# Patient Record
Sex: Male | Born: 1945 | Race: Black or African American | Hispanic: No | Marital: Married
Health system: Southern US, Academic
[De-identification: ages and names within clinical notes are randomized; demographics above are authoritative.]

## PROBLEM LIST (undated history)

## (undated) ENCOUNTER — Telehealth: Attending: Hematology & Oncology | Primary: Hematology & Oncology

## (undated) ENCOUNTER — Encounter: Attending: Family | Primary: Family

## (undated) ENCOUNTER — Telehealth

## (undated) ENCOUNTER — Ambulatory Visit

## (undated) ENCOUNTER — Encounter

## (undated) ENCOUNTER — Encounter: Attending: Urology | Primary: Urology

## (undated) ENCOUNTER — Encounter: Attending: Hematology & Oncology | Primary: Hematology & Oncology

## (undated) ENCOUNTER — Encounter
Attending: Pharmacist Clinician (PhC)/ Clinical Pharmacy Specialist | Primary: Pharmacist Clinician (PhC)/ Clinical Pharmacy Specialist

## (undated) ENCOUNTER — Telehealth
Attending: Pharmacist Clinician (PhC)/ Clinical Pharmacy Specialist | Primary: Pharmacist Clinician (PhC)/ Clinical Pharmacy Specialist

## (undated) ENCOUNTER — Ambulatory Visit: Payer: MEDICARE

## (undated) ENCOUNTER — Telehealth: Attending: MS" | Primary: MS"

## (undated) ENCOUNTER — Ambulatory Visit: Payer: MEDICARE | Attending: Hematology & Oncology | Primary: Hematology & Oncology

## (undated) ENCOUNTER — Ambulatory Visit
Payer: MEDICARE | Attending: Pharmacist Clinician (PhC)/ Clinical Pharmacy Specialist | Primary: Pharmacist Clinician (PhC)/ Clinical Pharmacy Specialist

## (undated) ENCOUNTER — Ambulatory Visit: Attending: Radiation Oncology | Primary: Radiation Oncology

## (undated) ENCOUNTER — Ambulatory Visit: Payer: MEDICARE | Attending: Internal Medicine | Primary: Internal Medicine

## (undated) ENCOUNTER — Encounter: Attending: Nurse Practitioner | Primary: Nurse Practitioner

## (undated) ENCOUNTER — Encounter: Attending: Oncology | Primary: Oncology

## (undated) ENCOUNTER — Encounter: Attending: Radiation Oncology | Primary: Radiation Oncology

## (undated) ENCOUNTER — Ambulatory Visit: Attending: Hematology & Oncology | Primary: Hematology & Oncology

## (undated) ENCOUNTER — Telehealth: Attending: Nurse Practitioner | Primary: Nurse Practitioner

## (undated) ENCOUNTER — Telehealth: Attending: Registered" | Primary: Registered"

## (undated) ENCOUNTER — Ambulatory Visit
Attending: Student in an Organized Health Care Education/Training Program | Primary: Student in an Organized Health Care Education/Training Program

## (undated) ENCOUNTER — Ambulatory Visit: Payer: MEDICARE | Attending: Urology | Primary: Urology

## (undated) ENCOUNTER — Telehealth: Attending: Internal Medicine | Primary: Internal Medicine

## (undated) DIAGNOSIS — E785 Hyperlipidemia, unspecified: Secondary | ICD-10-CM

## (undated) DIAGNOSIS — K649 Unspecified hemorrhoids: Secondary | ICD-10-CM

## (undated) DIAGNOSIS — R972 Elevated prostate specific antigen [PSA]: Secondary | ICD-10-CM

## (undated) DIAGNOSIS — C801 Malignant (primary) neoplasm, unspecified: Secondary | ICD-10-CM

## (undated) DIAGNOSIS — J449 Chronic obstructive pulmonary disease, unspecified: Secondary | ICD-10-CM

## (undated) DIAGNOSIS — K219 Gastro-esophageal reflux disease without esophagitis: Secondary | ICD-10-CM

## (undated) HISTORY — PX: PROSTATE CRYOABLATION: SUR358

## (undated) HISTORY — PX: HEMORRHOID SURGERY: SHX153

## (undated) MED ORDER — CALCIUM CARBONATE 500 MG CALCIUM (1,250 MG) TABLET: ORAL | 0 days

## (undated) MED ORDER — PUMPKIN SEED EXTRACT-SOY GERM 300 MG CAPSULE: ORAL | 0 days

## (undated) MED ORDER — MULTIVITAMIN CAPSULE: ORAL | 0 days

## (undated) MED ORDER — NON FORMULARY: ORAL | 0 days | PRN

## (undated) MED ORDER — SAW PALMETTO 160 MG CAPSULE: ORAL | 0 days

## (undated) MED ORDER — GARLIC 1,000 MG CAPSULE: ORAL | 0 days

## (undated) MED ORDER — CHOLECALCIFEROL (VITAMIN D3) 25 MCG (1,000 UNIT) TABLET: ORAL | 0 days

## (undated) MED ORDER — UNABLE TO FIND: Freq: Every day | ORAL | 0.00000 days | Status: SS

---
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
## 2006-01-24 ENCOUNTER — Ambulatory Visit: Payer: Self-pay | Admitting: Urology

## 2006-02-02 ENCOUNTER — Ambulatory Visit: Payer: Self-pay | Admitting: Urology

## 2006-03-06 ENCOUNTER — Ambulatory Visit: Payer: Self-pay | Admitting: Oncology

## 2006-03-23 ENCOUNTER — Ambulatory Visit: Payer: Self-pay | Admitting: Urology

## 2007-07-08 IMAGING — NM NUCLEAR MEDICINE WHOLE BODY BONE SCINTIGRAPHY
2 series · 6 of 6 positions shown · non-contrast
Comparison: none

REASON FOR EXAM: PROSTATE CA
COMMENTS:

[Series 1: 3 hr wholebody · 2.40mm/px · 2 of 2 frames shown]
[frame 1/2]
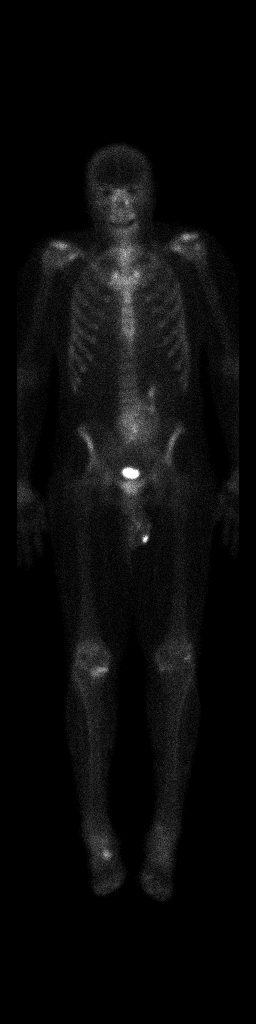
[frame 2/2]
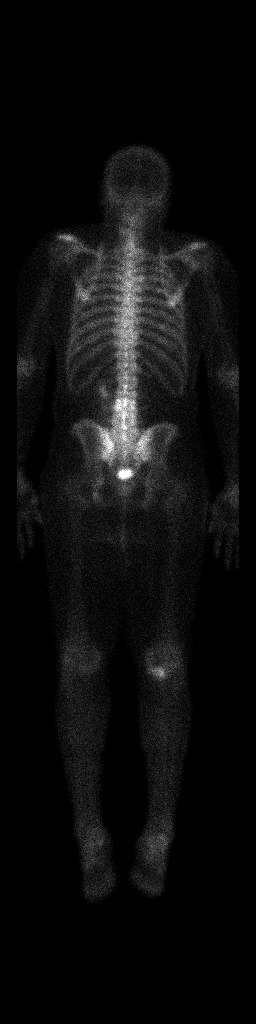

[Series 1: bone statics · 2.40mm/px · 2 acquisitions, 4 frames shown]
[im 1/2]
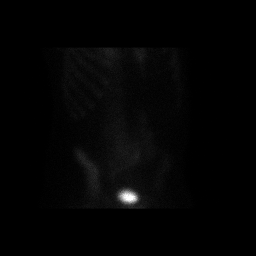
[im 1/2]
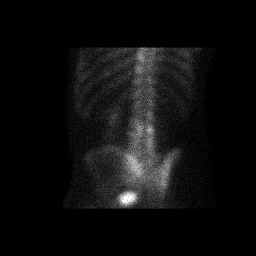
[im 2/2]
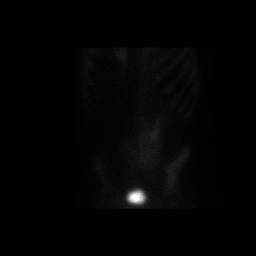
[im 2/2]
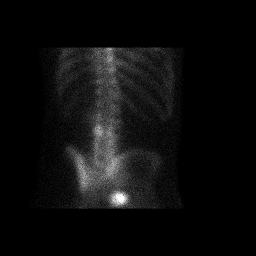

[6 of 6 positions shown; findings below may reference images not displayed]

PROCEDURE:     NM  - NM BONE WB 3 HR [DATE]  [DATE]

RESULT:     The patient has a history of prostate malignancy.  The patient
received 22.14 millicuries of technetium-33m labeled MDP for this study.

There is adequate uptake of the radiopharmaceutical by the skeleton.  No
RIGHT kidney is identified.  There is increased uptake in the lower abdomen
which may reflect a malpositioned RIGHT kidney.  This will merit further
evaluation with abdominal and pelvic CT scanning.  I do not see significant
abnormal uptake in the calvarium, spine, rib cage, pectoral girdle, or
pelvic girdle.  Mildly increased uptake is noted involving the RIGHT knee
and the RIGHT ankle consistent with degenerative change.  Minimal
degenerative-type change involving the LEFT knee is seen.
IMPRESSION: I do not see evidence of bony metastatic disease.

There is abnormal uptake of the radiopharmaceutical by the kidneys and there
may be at least a pelvic kidney present.  No normal uptake on the RIGHT is
seen.  CT scanning of the abdomen and pelvis is recommended.

Please note that there is some increased uptake over the lumbar spine that
is felt to lie within the presumed renal tissue in this area.  It is not
clearly within the lumbar spine itself.

## 2007-10-19 ENCOUNTER — Ambulatory Visit: Payer: Self-pay | Admitting: Unknown Physician Specialty

## 2009-12-02 ENCOUNTER — Ambulatory Visit: Payer: Self-pay | Admitting: Urology

## 2010-01-06 ENCOUNTER — Encounter (HOSPITAL_COMMUNITY): Admission: RE | Admit: 2010-01-06 | Discharge: 2010-04-06 | Payer: Self-pay | Admitting: Urology

## 2010-02-25 ENCOUNTER — Ambulatory Visit: Payer: Self-pay | Admitting: Urology

## 2013-06-21 ENCOUNTER — Ambulatory Visit: Payer: Self-pay | Admitting: Unknown Physician Specialty

## 2013-06-24 LAB — PATHOLOGY REPORT

## 2015-02-08 ENCOUNTER — Ambulatory Visit
Admission: EM | Admit: 2015-02-08 | Discharge: 2015-02-08 | Disposition: A | Discharge status: Home / Self Care | Payer: Medicare Other | Attending: Family Medicine | Admitting: Family Medicine

## 2015-02-08 ENCOUNTER — Encounter: Payer: Self-pay | Admitting: *Deleted

## 2015-02-08 DIAGNOSIS — J449 Chronic obstructive pulmonary disease, unspecified: Secondary | ICD-10-CM

## 2015-02-08 DIAGNOSIS — IMO0001 Reserved for inherently not codable concepts without codable children: Secondary | ICD-10-CM

## 2015-02-08 HISTORY — DX: Chronic obstructive pulmonary disease, unspecified: J44.9

## 2015-02-08 MED ORDER — AZITHROMYCIN 250 MG PO TABS: ORAL_TABLET | ORAL | Status: DC

## 2015-02-08 MED ORDER — GUAIFENESIN-CODEINE 100-10 MG/5ML PO SOLN: ORAL | Status: DC

## 2015-02-08 NOTE — ED Provider Notes (Signed)
CSN: [id number]     Arrival date & time 02/08/15  1205 History   First MD Initiated Contact with Patient 02/08/15 1357     Chief Complaint  Patient presents with  . Facial Pain   (Consider location/radiation/quality/duration/timing/severity/associated sxs/prior Treatment) Patient is a 69 y.o. male presenting with cough. The history is provided by the patient.  Cough Cough characteristics:  Productive Severity:  Moderate Onset quality:  Gradual Duration:  1 week Timing:  Constant Progression:  Worsening Chronicity:  New Smoker: yes (1 cigar a day; used to smoke cigarretes)   Context: exposure to allergens   Relieved by:  Nothing Worsened by:  Nothing tried Ineffective treatments:  None tried Associated symptoms: chills, headaches, rhinorrhea and sinus congestion   Associated symptoms: no chest pain, no fever, no myalgias, no rash, no shortness of breath, no sore throat, no weight loss and no wheezing     Past Medical History  Diagnosis Date  . COPD (chronic obstructive pulmonary disease)    History reviewed. No pertinent past surgical history. Family History  Problem Relation Age of Onset  . Diabetes Mother   . Diabetes Father   . Cancer Other    History  Substance Use Topics  . Smoking status: Current Every Day Smoker -- 1.00 packs/day for 25 years    Types: Cigarettes  . Smokeless tobacco: Not on file  . Alcohol Use: Yes    Review of Systems  Constitutional: Positive for chills. Negative for fever and weight loss.  HENT: Positive for rhinorrhea. Negative for sore throat.   Respiratory: Positive for cough. Negative for shortness of breath and wheezing.   Cardiovascular: Negative for chest pain.  Musculoskeletal: Negative for myalgias.  Skin: Negative for rash.  Neurological: Positive for headaches.    Allergies  Review of patient's allergies indicates no known allergies.  Home Medications   Prior to Admission medications   Medication Sig Start Date End  Date Taking? Authorizing Provider  albuterol (PROVENTIL HFA;VENTOLIN HFA) 108 (90 BASE) MCG/ACT inhaler Inhale into the lungs every 6 (six) hours as needed for wheezing or shortness of breath.   Yes Historical Provider, MD  bicalutamide (CASODEX) 50 MG tablet Take 50 mg by mouth daily.   Yes Historical Provider, MD  azithromycin (ZITHROMAX Z-PAK) 250 MG tablet 2 tabs po once day 1, then 1 tab po qd for 4 days 02/08/15   Norval Gable, MD  guaiFENesin-codeine 100-10 MG/5ML syrup 5-34mls po qhs prn cough 02/08/15   Norval Gable, MD   BP 124/73 mmHg  Pulse 90  Temp(Src) 98.4 F (36.9 C) (Oral)  Ht 5\' 9"  (1.753 m)  Wt 220 lb (99.791 kg)  BMI 32.47 kg/m2  SpO2 97% Physical Exam  Constitutional: He appears well-developed and well-nourished. No distress.  HENT:  Head: Normocephalic and atraumatic.  Right Ear: Tympanic membrane, external ear and ear canal normal.  Left Ear: Tympanic membrane, external ear and ear canal normal.  Nose: Nose normal.  Mouth/Throat: Uvula is midline, oropharynx is clear and moist and mucous membranes are normal. No oropharyngeal exudate or tonsillar abscesses.  Eyes: Conjunctivae and EOM are normal. Pupils are equal, round, and reactive to light. Right eye exhibits no discharge. Left eye exhibits no discharge. No scleral icterus.  Neck: Normal range of motion. Neck supple. No tracheal deviation present. No thyromegaly present.  Cardiovascular: Normal rate, regular rhythm and normal heart sounds.   Pulmonary/Chest: Effort normal. No stridor. No respiratory distress. He has wheezes. He has no rales. He exhibits no  tenderness.  Few diffuse wheezes and diffuse rhonchi  Lymphadenopathy:    He has no cervical adenopathy.  Neurological: He is alert.  Skin: Skin is warm and dry. No rash noted. He is not diaphoretic.  Nursing note and vitals reviewed.   ED Course  Procedures (including critical care time) Labs Review Labs Reviewed - No data to display  Imaging  Review No results found.   MDM   1. COPD with bronchitis    Discharge Medication List as of 02/08/2015  2:29 PM    START taking these medications   Details  azithromycin (ZITHROMAX Z-PAK) 250 MG tablet 2 tabs po once day 1, then 1 tab po qd for 4 days, Normal    guaiFENesin-codeine 100-10 MG/5ML syrup 5-15mls po qhs prn cough, Print      Plan: 1. Diagnosis reviewed with patient 2. rx as per orders; risks, benefits, potential side effects reviewed with patient; continue ventolin inhaler 3. Recommend supportive treatment with increased fluids, smoking cessationi 4. F/u prn if symptoms worsen or don't improve    Norval Gable, MD 02/08/15 1432

## 2015-02-08 NOTE — ED Notes (Signed)
Started 4 days ago, sore throat, coughing green sputum, mild headache and sinus pressure and congestion.

## 2015-12-22 ENCOUNTER — Encounter: Payer: Self-pay | Admitting: *Deleted

## 2015-12-22 ENCOUNTER — Ambulatory Visit: Payer: Medicare Other

## 2015-12-22 ENCOUNTER — Ambulatory Visit
Admission: EM | Admit: 2015-12-22 | Discharge: 2015-12-22 | Disposition: A | Discharge status: Home / Self Care | Payer: Medicare Other | Attending: Family Medicine | Admitting: Family Medicine

## 2015-12-22 DIAGNOSIS — R05 Cough: Secondary | ICD-10-CM | POA: Diagnosis present

## 2015-12-22 DIAGNOSIS — F17210 Nicotine dependence, cigarettes, uncomplicated: Secondary | ICD-10-CM | POA: Insufficient documentation

## 2015-12-22 DIAGNOSIS — Z79899 Other long term (current) drug therapy: Secondary | ICD-10-CM | POA: Insufficient documentation

## 2015-12-22 DIAGNOSIS — R1013 Epigastric pain: Secondary | ICD-10-CM | POA: Diagnosis not present

## 2015-12-22 DIAGNOSIS — R972 Elevated prostate specific antigen [PSA]: Secondary | ICD-10-CM | POA: Diagnosis not present

## 2015-12-22 DIAGNOSIS — J209 Acute bronchitis, unspecified: Secondary | ICD-10-CM | POA: Diagnosis not present

## 2015-12-22 DIAGNOSIS — Z8546 Personal history of malignant neoplasm of prostate: Secondary | ICD-10-CM | POA: Insufficient documentation

## 2015-12-22 DIAGNOSIS — Z9889 Other specified postprocedural states: Secondary | ICD-10-CM | POA: Insufficient documentation

## 2015-12-22 DIAGNOSIS — J440 Chronic obstructive pulmonary disease with acute lower respiratory infection: Secondary | ICD-10-CM | POA: Insufficient documentation

## 2015-12-22 DIAGNOSIS — K30 Functional dyspepsia: Secondary | ICD-10-CM | POA: Insufficient documentation

## 2015-12-22 DIAGNOSIS — K219 Gastro-esophageal reflux disease without esophagitis: Secondary | ICD-10-CM | POA: Insufficient documentation

## 2015-12-22 DIAGNOSIS — R062 Wheezing: Secondary | ICD-10-CM | POA: Insufficient documentation

## 2015-12-22 HISTORY — DX: Elevated prostate specific antigen (PSA): R97.20

## 2015-12-22 MED ORDER — AZITHROMYCIN 250 MG PO TABS: ORAL_TABLET | ORAL | Status: DC

## 2015-12-22 MED ORDER — BENZONATATE 100 MG PO CAPS: 100.0000 mg | ORAL_CAPSULE | Freq: Three times a day (TID) | ORAL | Status: DC | PRN

## 2015-12-22 MED ORDER — IPRATROPIUM-ALBUTEROL 0.5-2.5 (3) MG/3ML IN SOLN
3.0000 mL | Freq: Four times a day (QID) | RESPIRATORY_TRACT | Status: DC
  Administered 2015-12-22: 3 mL via RESPIRATORY_TRACT

## 2015-12-22 MED ORDER — PREDNISONE 20 MG PO TABS: 40.0000 mg | ORAL_TABLET | Freq: Every day | ORAL | Status: DC

## 2015-12-22 NOTE — ED Notes (Signed)
Patient started having gas pains for 2 weeks in the epi gastric region and abdomin. Additional symptom of cough is reported. No other symptoms reported.

## 2015-12-22 NOTE — Discharge Instructions (Signed)
Take medication as prescribed. Rest. Drink and eat regularly. Avoid aggravating foods or drinks. Take your home prilosec as directed, regularly.   Follow up with your primary care physician this week as discussed. Follow up with Dr Jacqlyn Larsen in one week. You need to follow up regarding our discussion pelvis xray findings.     Return to Urgent care or ER for chest pain, shortness of breath, abdominal pain, urinary or bowel changes, weight loss, new or worsening concerns.   Chronic Obstructive Pulmonary Disease Chronic obstructive pulmonary disease (COPD) is a common lung problem. In COPD, the flow of air from the lungs is limited. The way your lungs work will probably never return to normal, but there are things you can do to improve your lungs and make yourself feel better. Your doctor may treat your condition with:  Medicines.  Oxygen.  Lung surgery.  Changes to your diet.  Rehabilitation. This may involve a team of specialists. HOME CARE  Take all medicines as told by your doctor.  Avoid medicines or cough syrups that dry up your airway (such as antihistamines) and do not allow you to get rid of thick spit. You do not need to avoid them if told differently by your doctor.  If you smoke, stop. Smoking makes the problem worse.  Avoid being around things that make your breathing worse (like smoke, chemicals, and fumes).  Use oxygen therapy and therapy to help improve your lungs (pulmonary rehabilitation) if told by your doctor. If you need home oxygen therapy, ask your doctor if you should buy a tool to measure your oxygen level (oximeter).  Avoid people who have a sickness you can catch (contagious).  Avoid going outside when it is very hot, cold, or humid.  Eat healthy foods. Eat smaller meals more often. Rest before meals.  Stay active, but remember to also rest.  Make sure to get all the shots (vaccines) your doctor recommends. Ask your doctor if you need a pneumonia  shot.  Learn and use tips on how to relax.  Learn and use tips on how to control your breathing as told by your doctor. Try:  Breathing in (inhaling) through your nose for 1 second. Then, pucker your lips and breath out (exhale) through your lips for 2 seconds.  Putting one hand on your belly (abdomen). Breathe in slowly through your nose for 1 second. Your hand on your belly should move out. Pucker your lips and breathe out slowly through your lips. Your hand on your belly should move in as you breathe out.  Learn and use controlled coughing to clear thick spit from your lungs. The steps are:  Lean your head a little forward.  Breathe in deeply.  Try to hold your breath for 3 seconds.  Keep your mouth slightly open while coughing 2 times.  Spit any thick spit out into a tissue.  Rest and do the steps again 1 or 2 times as needed. GET HELP IF:  You cough up more thick spit than usual.  There is a change in the color or thickness of the spit.  It is harder to breathe than usual.  Your breathing is faster than usual. GET HELP RIGHT AWAY IF:  You have shortness of breath while resting.  You have shortness of breath that stops you from:  Being able to talk.  Doing normal activities.  You chest hurts for longer than 5 minutes.  Your skin color is more blue than usual.  Your pulse oximeter  shows that you have low oxygen for longer than 5 minutes. MAKE SURE YOU:  Understand these instructions.  Will watch your condition.  Will get help right away if you are not doing well or get worse.   This information is not intended to replace advice given to you by your health care provider. Make sure you discuss any questions you have with your health care provider.   Document Released: 02/15/2008 Document Revised: 09/19/2014 Document Reviewed: 04/25/2013 Elsevier Interactive Patient Education 2016 Else[employer]  Acute Bronchitis Bronchitis is when the airways that extend  from the windpipe into the lungs get red, puffy, and painful (inflamed). Bronchitis often causes thick spit (mucus) to develop. This leads to a cough. A cough is the most common symptom of bronchitis. In acute bronchitis, the condition usually begins suddenly and goes away over time (usually in 2 weeks). Smoking, allergies, and asthma can make bronchitis worse. Repeated episodes of bronchitis may cause more lung problems. HOME CARE  Rest.  Drink enough fluids to keep your pee (urine) clear or pale yellow (unless you need to limit fluids as told by your doctor).  Only take over-the-counter or prescription medicines as told by your doctor.  Avoid smoking and secondhand smoke. These can make bronchitis worse. If you are a smoker, think about using nicotine gum or skin patches. Quitting smoking will help your lungs heal faster.  Reduce the chance of getting bronchitis again by:  Washing your hands often.  Avoiding people with cold symptoms.  Trying not to touch your hands to your mouth, nose, or eyes.  Follow up with your doctor as told. GET HELP IF: Your symptoms do not improve after 1 week of treatment. Symptoms include:  Cough.  Fever.  Coughing up thick spit.  Body aches.  Chest congestion.  Chills.  Shortness of breath.  Sore throat. GET HELP RIGHT AWAY IF:   You have an increased fever.  You have chills.  You have severe shortness of breath.  You have bloody thick spit (sputum).  You throw up (vomit) often.  You lose too much body fluid (dehydration).  You have a severe headache.  You faint. MAKE SURE YOU:   Understand these instructions.  Will watch your condition.  Will get help right away if you are not doing well or get worse.   This information is not intended to replace advice given to you by your health care provider. Make sure you discuss any questions you have with your health care provider.   Document Released: 02/15/2008 Document Revised:  05/01/2013 Document Reviewed: 02/19/2013 Elsevier Interactive Patient Education [employer].

## 2015-12-22 NOTE — ED Provider Notes (Signed)
[facility]  ____________________________________________  Time seen: Approximately 3:20 PM  I have reviewed the triage vital signs and the nursing notes.   HISTORY  Chief Complaint Cough and wheezing   HPI Angel Moses is a 70 y.o. male presents with complaints of 1-2 weeks of intermittent runny nose and nasal congestion, chest congestion and intermittent wheezing. Patient reports his biggest reason for coming in today was the wheezing. Patient reports he does have a dry hacking cough. States occasionally he will cough up whitish phlegm. Patient states that he can hear himself wheeze intermittently. Patient reports that he can feel the mucus drainage in the back of his throat. Denies issues with seasonal allergies. Denies any persistent wheezing. Patient reports that he does have home albuterol inhaler which he used this morning,  which helped with wheezing quite a bit per patient. Denies any pain at this time.  Patient has a history of intermittent wheezing when sick per patient. Denies any chest pain, shortness of breath, chest pain with deep breath, extremity pain or extremity swelling. Denies weakness, dizziness, neck pain, back pain, dysuria, fevers. Denies abnormal bleeding. Denies blood in phlegm or mucus, denies blood in stool. Denies known sick contacts. Denies urinary or bowel changes. Denies constipation.  Patient does also report that over the last 3-4 weeks he has intermittent increased burping and flatulence. Patient states accompanying this he will have an occasional what he describes as a twinge in his upper abdomen that fully resolved after burping or passing gas. Patient states that this is not a pain but described again as a mild twinge. Patient states that the twinge is only present for a few seconds at a time and does not necessarily occur every day. States occasionally has increased with different foods that he eats but denies knowing exactly what foods cause this.  States unesolved with over-the-counter Pepto-Bismol. Patient reports that he does take Prilosec at home which helps, but states that he does not take it every day and hasn't been taking it regularly. Denies any other taken medications for the same.  PCP: Ellison Hughs Urologist: Jacqlyn Larsen  Past Medical History  Diagnosis Date  . COPD (chronic obstructive pulmonary disease) ([place])   . Elevated PSA   Prostate Cancer GERD Occasional urinary incontinence: Per patient chronic and unchanged.  There are no active problems to display for this patient.   Past Surgical History  Procedure Laterality Date  . Prostate cryoablation      Current Outpatient Rx  Name  Route  Sig  Dispense  Refill  . albuterol (PROVENTIL HFA;VENTOLIN HFA) 108 (90 BASE) MCG/ACT inhaler   Inhalation   Inhale into the lungs every 6 (six) hours as needed for wheezing or shortness of breath.         .           .           . bicalutamide (CASODEX) 50 MG tablet   Oral   Take 50 mg by mouth daily.                      Allergies Review of patient's allergies indicates no known allergies.  Family History  Problem Relation Age of Onset  . Diabetes Mother   . Diabetes Father   . Cancer Other     Social History Social History  Substance Use Topics  . Smoking status: Current Every Day Smoker -- 1.00 packs/day for 25 years    Types: Cigarettes  .  Smokeless tobacco: Never Used  . Alcohol Use: Yes    Review of Systems Constitutional: No fever/chills Eyes: No visual changes. ENT: No sore throat. Cardiovascular: Denies chest pain. Respiratory: Denies shortness of breath. Gastrointestinal: No abdominal pain.  No nausea, no vomiting.  No diarrhea.  No constipation. Genitourinary: Negative for dysuria. Musculoskeletal: Negative for back pain. Skin: Negative for rash. Neurological: Negative for headaches, focal weakness or numbness.  10-point ROS otherwise  negative.  ____________________________________________   PHYSICAL EXAM:  VITAL SIGNS: ED Triage Vitals  Enc Vitals Group     BP 12/22/15 1315 136/56 mmHg     Pulse Rate 12/22/15 1315 86     Resp 12/22/15 1315 18     Temp 12/22/15 1315 98.2 F (36.8 C)     Temp Source 12/22/15 1315 Tympanic     SpO2 12/22/15 1315 98 %     Weight 12/22/15 1315 220 lb (99.791 kg)     Height 12/22/15 1315 5\' 9"  (1.753 m)     Head Cir --      Peak Flow --      Pain Score 12/22/15 1320 0     Pain Loc --      Pain Edu? --      Excl. in [place]? --    Constitutional: Alert and oriented. Well appearing and in no acute distress. Eyes: Conjunctivae are normal. PERRL. EOMI. Head: Atraumatic.Mild tenderness to palpation bilateral frontal and maxillary sinuses. No swelling. No erythema.   Ears: no erythema, normal TMs bilaterally.   Nose:mild nasal congestion with bilateral nasal turbinate erythema and edema.   Mouth/Throat: Mucous membranes are moist.  Oropharynx non-erythematous.No tonsillar swelling or exudate.  Neck: No stridor.  No cervical spine tenderness to palpation. Hematological/Lymphatic/Immunilogical: No cervical lymphadenopathy. Cardiovascular: Normal rate, regular rhythm. Grossly normal heart sounds.  Good peripheral circulation. Respiratory: Normal respiratory effort.  No retractions. Mild scattered rhonchi. Mild to moderate Inspiratory and expiratory wheezes present. Dry intermittent cough noted in room.Kermit Balo air movement. Speaks in complete sentences. Ambulatory in room while speaking in complete sentences. Gastrointestinal: Soft and nontender. No distention. Normal Bowel sounds. No CVA tenderness. Sits up from lying to sitting position quickly with use of abdominal muscles without pain. Musculoskeletal: No lower or upper extremity tenderness nor edema.  Bilateral pedal pulses equal and easily palpated. No cervical, thoracic or lumbar tenderness to palpation. No hip or pelvis or lumbar  tenderness. Full range of motion bilateral hips without pain. Neurologic:  Normal speech and language. No gross focal neurologic deficits are appreciated. No gait instability. Skin:  Skin is warm, dry and intact. No rash noted. Psychiatric: Mood and affect are normal. Speech and behavior are normal.  ____________________________________________   LABS (all labs ordered are listed, but only abnormal results are displayed)  Labs Reviewed - No data to display   ECG ED ECG REPORT I, Marylene Land, the attending provider and Dr Zenda Alpers, personally viewed and interpreted this ECG.   Date: 12/22/2015  EKG Time: 1316  Rate: 86  Rhythm: normal EKG, normal sinus rhythm, unchanged from previous tracings, normal sinus rhythm  Axis: normal  Intervals:none  ST&T Change: no ST elevation No EKG for comparison found.    Radiology  EXAM: ABDOMEN - 1 VIEW  COMPARISON: Chest x-ray same date dictated separately.  FINDINGS: No plain film evidence of bowel obstruction.  The possibility of free intraperitoneal air cannot be assessed on a supine view.  Scattered small sclerotic foci involving portions of the pelvis. Small metastatic lesions  not excluded although findings may be partially explained by degenerative changes (sacroiliac joints, pubic symphysis and hip joints).  Mild degenerative changes lumbar spine with minimal curvature convex to the right.  IMPRESSION: No plain film evidence bowel obstruction.  Question possibility of sclerotic metastatic foci involving portions of the pelvis as noted above.   Electronically Signed By: Genia Del M.D. On: 12/22/2015 14:43          DG Chest 2 View (Final result) Result time: 12/22/15 14:41:29   Final result by Rad Results In Interface (12/22/15 14:41:29)   Narrative:   CLINICAL DATA: Epigastric discomfort for the past 2 weeks, productive cough  EXAM: CHEST 2 VIEW  COMPARISON: Report of a chest x-ray dated  March 11, 2014  FINDINGS: The lungs remain hyperinflated with hemidiaphragm flattening. There is no focal infiltrate. There is no pleural effusion. The heart and pulmonary vascularity are normal. The mediastinum is normal in width. There is no pleural effusion. There is mild multilevel degenerative disc disease of the thoracic spine. The gas pattern in the upper abdomen is normal where visualized.  IMPRESSION: COPD. There is no evidence of pneumonia nor other active cardiopulmonary disease.   Electronically Signed By: David Martinique M.D. On: 12/22/2015 14:41       INITIAL IMPRESSION / ASSESSMENT AND PLAN / ED COURSE  Pertinent labs & imaging results that were available during my care of the patient were reviewed by me and considered in my medical decision making (see chart for details).   Very well-appearing patient in no acute distress. Presenting for the complaints of 2 weeks of cough, congestion with intermittent wheezing. Denies chest pain, shortness breath, chest pain with deep breath, abdominal pain. Does also report intermittent indigestion discomfort which he describes as having more gas than normal. Reports there are some dietary triggers that affect this but denies remembering exactly what foods all this. States home Prilosec helps. Denies any abdominal pain, urinary or bowel changes. Reports frequent burping and flatulence. Patient with mild scattered rhonchi and mild scattered inspiratory and expiratory wheezes. Abdomen soft and nontender, with normal bowel sounds. Suspect COPD exacerbation with bronchitis. Suspect indigestion, due to patient reported triggers and improvements. Patient stable appearance. Will evaluate chest xray and KUB. Discussed with patient, evaluation with labs, patient denies need for labs, as symptoms appear stable over several weeks with likely indigestion, will defer labs at this time. Albuterol ipratropium neb 1.  KUB no plain film evidence of  bowel obstruction, question possibility of sclerotic metastastic foci involving portions of the pelvis as noted above per radiologist. Patient reports that he doesn't have a known history of arthritis in hips and pelvis. Chest x-ray per radiologist COPD, there is no evidence of pneumonia nor other active cardiopulmonary disease.   discuss x-ray results in detail with patient. Lungs reexamined, wheezes fully resolved. Patient reports cough and chest congestion much improved. Patient states feeling better. Suspect COPD exacerbation of bronchitis. Will treat with azithromycin, when necessary Tessalon Perles, prednisone 5 days and use of home albuterol inhaler. Suspect indigestion regarding patient's other symptoms as he described. Abdomen soft and nontender. Discussed KUB incidental x-ray findings and discuss these with patient, recommend patient to follow-up closely with his primary care physician regarding further evaluation and management of this. Patient reports last PET scan was in 2011. Discussed with patient to follow-up in regards to potential repeat PET versus comparison to previous findings. Encouraged patient to have close follow-up with his primary care physician. Encourage follow-up with his primary care  physician this week. Also recommend follow-up with his urologist Dr. Jacqlyn Larsen who per patient manages his cancer.  Discussed follow up with Primary care physician this week. Discussed follow up and return parameters to urgent care or ER  including  chest pain, shortness of breath, abdominal pain, vomiting, nausea, dizziness, weakness, no resolution or any worsening concerns. Patient verbalized understanding and agreed to plan.   ____________________________________________   FINAL CLINICAL IMPRESSION(S) / ED DIAGNOSES  Final diagnoses:  COPD with acute bronchitis ([place])  Indigestion      Note: This dictation was prepared with Dragon dictation along with smaller phrase technology. Any  transcriptional errors that result from this process are unintentional.    Marylene Land, NP 12/22/15 1802

## 2016-01-10 ENCOUNTER — Ambulatory Visit
Admission: EM | Admit: 2016-01-10 | Discharge: 2016-01-10 | Disposition: A | Discharge status: Home / Self Care | Payer: Medicare Other | Attending: Family Medicine | Admitting: Family Medicine

## 2016-01-10 DIAGNOSIS — IMO0001 Reserved for inherently not codable concepts without codable children: Secondary | ICD-10-CM

## 2016-01-10 DIAGNOSIS — J449 Chronic obstructive pulmonary disease, unspecified: Secondary | ICD-10-CM | POA: Diagnosis not present

## 2016-01-10 DIAGNOSIS — J0100 Acute maxillary sinusitis, unspecified: Secondary | ICD-10-CM | POA: Diagnosis not present

## 2016-01-10 DIAGNOSIS — H9202 Otalgia, left ear: Secondary | ICD-10-CM

## 2016-01-10 MED ORDER — PREDNISONE 10 MG PO TABS: ORAL_TABLET | ORAL | Status: DC

## 2016-01-10 MED ORDER — DOXYCYCLINE HYCLATE 100 MG PO CAPS: 100.0000 mg | ORAL_CAPSULE | Freq: Two times a day (BID) | ORAL | Status: DC

## 2016-01-10 MED ORDER — FLUTICASONE PROPIONATE 50 MCG/ACT NA SUSP: 2.0000 | Freq: Every day | NASAL | Status: DC

## 2016-01-10 NOTE — ED Notes (Signed)
Patient states that in his left ear he has a sensation that "something is moving in there". Patient states that he was supposed to follow up for his cough on 01/15/2016, so he wanted to see if he could be seen for that as well. Patient states that he is still having a cough with mild mucus production. Patient states that cough is intermittent.

## 2016-01-10 NOTE — Discharge Instructions (Signed)
Take medication as prescribed. Rest. Use home albuterol inhaler as needed for wheezing.  Follow up with your primary care physician this week as scheduled.   Return to Urgent care or ER for new or worsening concerns.    Chronic Obstructive Pulmonary Disease Exacerbation Chronic obstructive pulmonary disease (COPD) is a common lung condition in which airflow from the lungs is limited. COPD is a general term that can be used to describe many different lung problems that limit airflow, including chronic bronchitis and emphysema. COPD exacerbations are episodes when breathing symptoms become much worse and require extra treatment. Without treatment, COPD exacerbations can be life threatening, and frequent COPD exacerbations can cause further damage to your lungs. CAUSES  Respiratory infections.  Exposure to smoke.  Exposure to air pollution, chemical fumes, or dust. Sometimes there is no apparent cause or trigger. RISK FACTORS  Smoking cigarettes.  Older age.  Frequent prior COPD exacerbations. SIGNS AND SYMPTOMS  Increased coughing.  Increased thick spit (sputum) production.  Increased wheezing.  Increased shortness of breath.  Rapid breathing.  Chest tightness. DIAGNOSIS Your medical history, a physical exam, and tests will help your health care provider make a diagnosis. Tests may include:  A chest X-ray.  Basic lab tests.  Sputum testing.  An arterial blood gas test. TREATMENT Depending on the severity of your COPD exacerbation, you may need to be admitted to a hospital for treatment. Some of the treatments commonly used to treat COPD exacerbations are:   Antibiotic medicines.  Bronchodilators. These are drugs that expand the air passages. They may be given with an inhaler or nebulizer. Spacer devices may be needed to help improve drug delivery.  Corticosteroid medicines.  Supplemental oxygen therapy.  Airway clearing techniques, such as noninvasive  ventilation (NIV) and positive expiratory pressure (PEP). These provide respiratory support through a mask or other noninvasive device. HOME CARE INSTRUCTIONS  Do not smoke. Quitting smoking is very important to prevent COPD from getting worse and exacerbations from happening as often.  Avoid exposure to all substances that irritate the airway, especially to tobacco smoke.  If you were prescribed an antibiotic medicine, finish it all even if you start to feel better.  Take all medicines as directed by your health care provider.It is important to use correct technique with inhaled medicines.  Drink enough fluids to keep your urine clear or pale yellow (unless you have a medical condition that requires fluid restriction).  Use a cool mist vaporizer. This makes it easier to clear your chest when you cough.  If you have a home nebulizer and oxygen, continue to use them as directed.  Maintain all necessary vaccinations to prevent infections.  Exercise regularly.  Eat a healthy diet.  Keep all follow-up appointments as directed by your health care provider. SEEK IMMEDIATE MEDICAL CARE IF:  You have worsening shortness of breath.  You have trouble talking.  You have severe chest pain.  You have blood in your sputum.  You have a fever.  You have weakness, vomit repeatedly, or faint.  You feel confused.  You continue to get worse. MAKE SURE YOU:  Understand these instructions.  Will watch your condition.  Will get help right away if you are not doing well or get worse.   This information is not intended to replace advice given to you by your health care provider. Make sure you discuss any questions you have with your health care provider.   Document Released: 06/26/2007 Document Revised: 09/19/2014 Document Reviewed: 05/03/2013 Elsevier  Interactive Patient Education 2016 [place] An earache, also called otalgia, can be caused by many things. Pain from an  earache can be sharp, dull, or burning. The pain may be temporary or constant. Earaches can be caused by problems with the ear, such as infection in either the middle ear or the ear canal, injury, impacted ear wax, middle ear pressure, or a foreign body in the ear. Ear pain can also result from problems in other areas. This is called referred pain. For example, pain can come from a sore throat, a tooth infection, or problems with the jaw or the joint between the jaw and the skull (temporomandibular joint, or TMJ). The cause of an earache is not always easy to identify. Watchful waiting may be appropriate for some earaches until a clear cause of the pain can be found. HOME CARE INSTRUCTIONS Watch your condition for any changes. The following actions may help to lessen any discomfort that you are feeling:  Take medicines only as directed by your health care provider. This includes ear drops.  Apply ice to your outer ear to help reduce pain.  Put ice in a plastic bag.  Place a towel between your skin and the bag.  Leave the ice on for 20 minutes, 2-3 times per day.  Do not put anything in your ear other than medicine that is prescribed by your health care provider.  Try resting in an upright position instead of lying down. This may help to reduce pressure in the middle ear and relieve pain.  Chew gum if it helps to relieve your ear pain.  Control any allergies that you have.  Keep all follow-up visits as directed by your health care provider. This is important. SEEK MEDICAL CARE IF:  Your pain does not improve within 2 days.  You have a fever.  You have new or worsening symptoms. SEEK IMMEDIATE MEDICAL CARE IF:  You have a severe headache.  You have a stiff neck.  You have difficulty swallowing.  You have redness or swelling behind your ear.  You have drainage from your ear.  You have hearing loss.  You feel dizzy.   This information is not intended to replace advice  given to you by your health care provider. Make sure you discuss any questions you have with your health care provider.   Document Released: 04/15/2004 Document Revised: 09/19/2014 Document Reviewed: 03/30/2014 Elsevier Interactive Patient Education 2016 [employer]  Sinusitis, Adult Sinusitis is redness, soreness, and inflammation of the paranasal sinuses. Paranasal sinuses are air pockets within the bones of your face. They are located beneath your eyes, in the middle of your forehead, and above your eyes. In healthy paranasal sinuses, mucus is able to drain out, and air is able to circulate through them by way of your nose. However, when your paranasal sinuses are inflamed, mucus and air can become trapped. This can allow bacteria and other germs to grow and cause infection. Sinusitis can develop quickly and last only a short time (acute) or continue over a long period (chronic). Sinusitis that lasts for more than 12 weeks is considered chronic. CAUSES Causes of sinusitis include:  Allergies.  Structural abnormalities, such as displacement of the cartilage that separates your nostrils (deviated septum), which can decrease the air flow through your nose and sinuses and affect sinus drainage.  Functional abnormalities, such as when the small hairs (cilia) that line your sinuses and help remove mucus do not work properly or are not  present. SIGNS AND SYMPTOMS Symptoms of acute and chronic sinusitis are the same. The primary symptoms are pain and pressure around the affected sinuses. Other symptoms include:  Upper toothache.  Earache.  Headache.  Bad breath.  Decreased sense of smell and taste.  A cough, which worsens when you are lying flat.  Fatigue.  Fever.  Thick drainage from your nose, which often is green and may contain pus (purulent).  Swelling and warmth over the affected sinuses. DIAGNOSIS Your health care provider will perform a physical exam. During your exam,  your health care provider may perform any of the following to help determine if you have acute sinusitis or chronic sinusitis:  Look in your nose for signs of abnormal growths in your nostrils (nasal polyps).  Tap over the affected sinus to check for signs of infection.  View the inside of your sinuses using an imaging device that has a light attached (endoscope). If your health care provider suspects that you have chronic sinusitis, one or more of the following tests may be recommended:  Allergy tests.  Nasal culture. A sample of mucus is taken from your nose, sent to a lab, and screened for bacteria.  Nasal cytology. A sample of mucus is taken from your nose and examined by your health care provider to determine if your sinusitis is related to an allergy. TREATMENT Most cases of acute sinusitis are related to a viral infection and will resolve on their own within 10 days. Sometimes, medicines are prescribed to help relieve symptoms of both acute and chronic sinusitis. These may include pain medicines, decongestants, nasal steroid sprays, or saline sprays. However, for sinusitis related to a bacterial infection, your health care provider will prescribe antibiotic medicines. These are medicines that will help kill the bacteria causing the infection. Rarely, sinusitis is caused by a fungal infection. In these cases, your health care provider will prescribe antifungal medicine. For some cases of chronic sinusitis, surgery is needed. Generally, these are cases in which sinusitis recurs more than 3 times per year, despite other treatments. HOME CARE INSTRUCTIONS  Drink plenty of water. Water helps thin the mucus so your sinuses can drain more easily.  Use a humidifier.  Inhale steam 3-4 times a day (for example, sit in the bathroom with the shower running).  Apply a warm, moist washcloth to your face 3-4 times a day, or as directed by your health care provider.  Use saline nasal sprays to  help moisten and clean your sinuses.  Take medicines only as directed by your health care provider.  If you were prescribed either an antibiotic or antifungal medicine, finish it all even if you start to feel better. SEEK IMMEDIATE MEDICAL CARE IF:  You have increasing pain or severe headaches.  You have nausea, vomiting, or drowsiness.  You have swelling around your face.  You have vision problems.  You have a stiff neck.  You have difficulty breathing.   This information is not intended to replace advice given to you by your health care provider. Make sure you discuss any questions you have with your health care provider.   Document Released: 08/29/2005 Document Revised: 09/19/2014 Document Reviewed: 09/13/2011 Elsevier Interactive Patient Education [employer].

## 2016-01-10 NOTE — ED Provider Notes (Signed)
[facility]  ____________________________________________  Time seen: Approximately 1:35 PM  I have reviewed the triage vital signs and the nursing notes.   HISTORY  Chief Complaint Ear Fullness   HPI KEYUR NOVOTNY is a 70 y.o. male presents with a complaint of left ear irritation. Patient reports that he sometimes does have wax buildup in his ears but states that for the last few days he feels like his left ear is somewhat congested and states he occasionally feels like something is "moving" in his ear. Patient states that he wanted to make sure he did not have an insect in his ear. Patient reports that he does have a history of seasonal allergies and has had some runny nose, nasal congestion, sneezing and postnasal drainage. Patient states when he lays down at night he can feel the nasal drainage in the back of his throat. Which causes a cough for him. Reports that cough is usually dry cough. Patient reports that he does frequently blow his nose and thick nasal drainage is greenish out. Patient also reports that he has intermittent wheezing.  Patient reports that he was seen in urgent care at approximate 2 weeks ago and states that the medications that he was given at that time temporarily resolved his wheezing. Patient reports that he felt much better at that time but states that the wheezing came back. Denies any chest pain, shortness of breath, or chest pain with deep breath. Reports does have home albuterol inhaler and he has used intermittently which helps with the wheezing but states wheezes return.  Denies chest pain, shortness of breath, chest pain with deep breath, weakness, dizziness, fevers, extremity pain, extremity swelling, decreased physical activity. 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  Patient reports that he has a follow-up with his primary doctor this Friday and states that this follow-up appointment is a follow-up from  his previous urgent care visit for his wheezing as well as his abdominal x-ray findings.   Past Medical History  Diagnosis Date  . COPD (chronic obstructive pulmonary disease) ([place])   . Elevated PSA     There are no active problems to display for this patient.   Past Surgical History  Procedure Laterality Date  . Prostate cryoablation      Current Outpatient Rx  Name  Route  Sig  Dispense  Refill  . albuterol (PROVENTIL HFA;VENTOLIN HFA) 108 (90 BASE) MCG/ACT inhaler   Inhalation   Inhale into the lungs every 6 (six) hours as needed for wheezing or shortness of breath.         . bicalutamide (CASODEX) 50 MG tablet   Oral   Take 50 mg by mouth daily.         .           .           .           .             Allergies Review of patient's allergies indicates no known allergies.  Family History  Problem Relation Age of Onset  . Diabetes Mother   . Diabetes Father   . Cancer Other     Social History Social History  Substance Use Topics  . Smoking status: Current Every Day Smoker -- 1.00 packs/day for 25 years    Types: Cigarettes  . Smokeless tobacco: Never Used  . Alcohol Use: 0.0 oz/week  0 Standard drinks or equivalent per week     Comment: 12 pack    Review of Systems Constitutional: No fever/chills Eyes: No visual changes. ENT: As above. Cardiovascular: Denies chest pain. Respiratory: Denies shortness of breath. Gastrointestinal: No abdominal pain.  No nausea, no vomiting.  No diarrhea.  No constipation. Genitourinary: Negative for dysuria. Musculoskeletal: Negative for back pain. Skin: Negative for rash. Neurological: Negative for headaches, focal weakness or numbness.  10-point ROS otherwise negative.  ____________________________________________   PHYSICAL EXAM:  VITAL SIGNS: ED Triage Vitals  Enc Vitals Group     BP 01/10/16 1200 138/74 mmHg     Pulse Rate 01/10/16 1200 64     Resp 01/10/16 1200 17     Temp 01/10/16 1200 98.2 F  (36.8 C)     Temp Source 01/10/16 1200 Oral     SpO2 01/10/16 1200 97 %     Weight 01/10/16 1200 220 lb (99.791 kg)     Height 01/10/16 1200 5\' 9"  (1.753 m)     Head Cir --      Peak Flow --      Pain Score 01/10/16 1204 0     Pain Loc --      Pain Edu? --      Excl. in [place]? --   Constitutional: Alert and oriented. Well appearing and in no acute distress. Eyes: Conjunctivae are normal. PERRL. EOMI. Head: Atraumatic.Mild tenderness to palpation bilateral frontal and maxillary sinuses. No swelling. No erythema.   Ears: no erythema, normal TMs bilaterally.   Nose: nasal congestion with bilateral nasal turbinate erythema and edema.   Mouth/Throat: Mucous membranes are moist.  Oropharynx non-erythematous.No tonsillar swelling or exudate. Nasal drainage visible in posterior pharynx Neck: No stridor.  No cervical spine tenderness to palpation. Hematological/Lymphatic/Immunilogical: No cervical lymphadenopathy. Cardiovascular: Normal rate, regular rhythm. Grossly normal heart sounds.  Good peripheral circulation. Respiratory: Normal respiratory effort.  No retractions. Mild scattered inspiratory and expiratory wheezes. No rales or rhonchi. No active cough in room. No focal area of consolidation auscultated. Good air movement.  Gastrointestinal: Soft and nontender. No distention. Normal Bowel sounds. No CVA tenderness. Musculoskeletal: No lower or upper extremity tenderness nor edema.  Bilateral pedal pulses equal and easily palpated. No cervical, thoracic or lumbar tenderness to palpation. No calf tenderness bilaterally. Neurologic:  Normal speech and language. No gross focal neurologic deficits are appreciated. No gait instability. Skin:  Skin is warm, dry and intact. No rash noted. Psychiatric: Mood and affect are normal. Speech and behavior are normal.  ____________________________________________   LABS (all labs ordered are listed, but only abnormal results are displayed)  Labs Reviewed  - No data to display   INITIAL IMPRESSION / ASSESSMENT AND PLAN / ED COURSE  Pertinent labs & imaging results that were available during my care of the patient were reviewed by me and considered in my medical decision making (see chart for details).  Very well-appearing patient. No acute distress. Ambulatory in hallway. Vital signs stable. Present for the complaints of left ear irritation. Patient also with sinus congestion, sinus pressure and nasal drainage with intermittent cough. Patient with mild scattered inspiratory and expiratory wheezes and nasal drainage. Ears clear without foreign bodies or infection. Suspect patient has allergic rhinitis with secondary sinusitis. Also concerned that the allergic rhinitis is continuing his COPD exacerbation. Discussed with patient to continue home albuterol inhalers and as prednisone did help resolve his wheezing previously will place patient back on prednisone taper course and encouraged patient to  keep his appointment this Friday.   Discussed follow up with Primary care physician this week. Discussed follow up and return parameters including no resolution or any worsening concerns. Patient verbalized understanding and agreed to plan.   ____________________________________________    FINAL CLINICAL IMPRESSION(S) / ED DIAGNOSES  Final diagnoses:  Acute maxillary sinusitis, recurrence not specified  COPD bronchitis  Otalgia of left ear      Note: This dictation was prepared with Dragon dictation along with smaller phrase technology. Any transcriptional errors that result from this process are unintentional.    Marylene Land, NP 01/10/16 1350

## 2017-05-04 ENCOUNTER — Ambulatory Visit
Admission: RE | Admit: 2017-05-04 | Discharge: 2017-05-04 | Disposition: A | Discharge status: Home / Self Care | Payer: MEDICARE

## 2017-05-04 DIAGNOSIS — C61 Malignant neoplasm of prostate: Principal | ICD-10-CM

## 2017-05-05 ENCOUNTER — Ambulatory Visit: Admission: RE | Admit: 2017-05-05 | Discharge: 2017-05-05 | Payer: MEDICARE

## 2017-08-04 ENCOUNTER — Ambulatory Visit
Admission: EM | Admit: 2017-08-04 | Discharge: 2017-08-04 | Disposition: A | Discharge status: Home / Self Care | Payer: Medicare Other | Attending: Family Medicine | Admitting: Family Medicine

## 2017-08-04 ENCOUNTER — Encounter: Payer: Self-pay | Admitting: *Deleted

## 2017-08-04 DIAGNOSIS — J449 Chronic obstructive pulmonary disease, unspecified: Secondary | ICD-10-CM | POA: Insufficient documentation

## 2017-08-04 DIAGNOSIS — Z859 Personal history of malignant neoplasm, unspecified: Secondary | ICD-10-CM | POA: Insufficient documentation

## 2017-08-04 DIAGNOSIS — F17210 Nicotine dependence, cigarettes, uncomplicated: Secondary | ICD-10-CM | POA: Insufficient documentation

## 2017-08-04 DIAGNOSIS — R197 Diarrhea, unspecified: Secondary | ICD-10-CM | POA: Insufficient documentation

## 2017-08-04 HISTORY — DX: Malignant (primary) neoplasm, unspecified: C80.1

## 2017-08-04 LAB — CBC WITH DIFFERENTIAL/PLATELET
BASOS PCT: 2 %
Basophils Absolute: 0.1 10*3/uL (ref 0–0.1)
EOS ABS: 0.4 10*3/uL (ref 0–0.7)
EOS PCT: 6 %
HCT: 39.4 % — ABNORMAL LOW (ref 40.0–52.0)
HEMOGLOBIN: 13.1 g/dL (ref 13.0–18.0)
LYMPHS ABS: 2.6 10*3/uL (ref 1.0–3.6)
Lymphocytes Relative: 37 %
MCH: 30.7 pg (ref 26.0–34.0)
MCHC: 33.3 g/dL (ref 32.0–36.0)
MCV: 92.4 fL (ref 80.0–100.0)
MONO ABS: 0.4 10*3/uL (ref 0.2–1.0)
MONOS PCT: 6 %
Neutro Abs: 3.5 10*3/uL (ref 1.4–6.5)
Neutrophils Relative %: 49 %
Platelets: 231 10*3/uL (ref 150–440)
RBC: 4.26 MIL/uL — ABNORMAL LOW (ref 4.40–5.90)
RDW: 14 % (ref 11.5–14.5)
WBC: 7 10*3/uL (ref 3.8–10.6)

## 2017-08-04 LAB — BASIC METABOLIC PANEL
Anion gap: 7 (ref 5–15)
BUN: 18 mg/dL (ref 6–20)
CALCIUM: 9.5 mg/dL (ref 8.9–10.3)
CHLORIDE: 100 mmol/L — AB (ref 101–111)
CO2: 27 mmol/L (ref 22–32)
CREATININE: 0.81 mg/dL (ref 0.61–1.24)
GFR calc non Af Amer: >60 mL/min (ref >60)
Glucose, Bld: 88 mg/dL (ref 65–99)
Potassium: 4 mmol/L (ref 3.5–5.1)
SODIUM: 134 mmol/L — AB (ref 135–145)

## 2017-08-04 NOTE — Discharge Instructions (Signed)
Drink plenty of water. Refer to diet information.  Follow up with your primary care physician this week as needed. Return to Urgent care for new or worsening concerns.

## 2017-08-04 NOTE — ED Triage Notes (Signed)
Diarrhea x 3-5 days. Denies other symptoms.

## 2017-08-04 NOTE — ED Provider Notes (Signed)
[facility] ____________________________________________  Time seen: Approximately 5:47 PM  I have reviewed the triage vital signs and the nursing notes.   HISTORY  Chief Complaint Diarrhea   HPI Angel Moses is a 71 y.o. male presenting for evaluation of diarrhea that has been present intermittently over the last 3 days.  States he had a few episodes last week, but nothing that recurred until last few days.  States overnight last night he had 3 episodes of diarrhea and one episode during the day today.  Patient states that he has not had any since this morning.  Denies associated abdominal pain, vomiting, abnormal colored stool, melena, hematochezia.  Denies any appearance of bleeding.  States no known food triggers.  Reports to continue with normal appetite, continues to eat and drink well.  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  Denies home sick contacts.  Denies accompanying fevers, chest pain, shortness of breath, abdominal pain, recent antibiotic use, dysuria or other complaints.  Denies aggravating or alleviating factors.  States he has been unable to identify an associated trigger.  States that he did try 1 dose of Pepto-Bismol yesterday with a slight improvement, no resolution.  Reports otherwise feels well.  Also states no associated weakness. Denies recent sickness. Denies recent antibiotic use.   Sofie Hartigan, MD: PCP  Past Medical History:  Diagnosis Date  . Cancer ([place])   . COPD (chronic obstructive pulmonary disease) ([place])   . Elevated PSA     There are no active problems to display for this patient.   Past Surgical History:  Procedure Laterality Date  . PROSTATE CRYOABLATION       No current facility-administered medications for this encounter.   Current Outpatient Medications:  .  albuterol (PROVENTIL HFA;VENTOLIN HFA) 108 (90 BASE) MCG/ACT inhaler, Inhale into the lungs every 6 (six) hours as needed for  wheezing or shortness of breath., Disp: , Rfl:   Allergies Patient has no known allergies.  Family History  Problem Relation Age of Onset  . Diabetes Mother   . Diabetes Father   . Cancer Other     Social History Social History   Tobacco Use  . Smoking status: Current Every Day Smoker    Packs/day: 1.00    Years: 25.00    Pack years: 25.00    Types: Cigarettes  . Smokeless tobacco: Never Used  Substance Use Topics  . Alcohol use: Yes    Alcohol/week: 0.0 oz    Comment: 12 pack  . Drug use: No    Review of Systems Constitutional: No fever/chills ENT: No sore throat. Cardiovascular: Denies chest pain. Respiratory: Denies shortness of breath. Gastrointestinal: No abdominal pain.  No nausea, no vomiting.  Positive diarrhea.  No constipation.  Denies recent laxative use.  States stool is normal brownish color and loose. Continues to pass gas normally. Genitourinary: Negative for dysuria. Musculoskeletal: Negative for back pain. Skin: Negative for rash. Neurological: Negative for headaches, focal weakness or numbness.  10-point ROS otherwise negative.  ____________________________________________   PHYSICAL EXAM:  VITAL SIGNS: ED Triage Vitals  Enc Vitals Group     BP 08/04/17 1551 (!) 141/81     Pulse Rate 08/04/17 1551 64     Resp 08/04/17 1551 16     Temp 08/04/17 1551 98.1 F (36.7 C)     Temp Source 08/04/17 1551 Oral     SpO2 08/04/17 1551 100 %     Weight 08/04/17  1553 223 lb (101.2 kg)     Height 08/04/17 1553 5\' 9"  (1.753 m)     Head Circumference --      Peak Flow --      Pain Score --      Pain Loc --      Pain Edu? --      Excl. in [place]? --     Constitutional: Alert and oriented. Well appearing and in no acute distress. ENT      Head: Normocephalic and atraumatic.      Mouth/Throat: Mucous membranes are moist. Oropharynx non-erythematous. Neck: No stridor. Supple without meningismus.  Hematological/Lymphatic/Immunilogical: No cervical  lymphadenopathy. Cardiovascular: Normal rate, regular rhythm. Grossly normal heart sounds.  Good peripheral circulation. Respiratory: Normal respiratory effort without tachypnea nor retractions. Breath sounds are clear and equal bilaterally. No wheezes, rales, rhonchi. Gastrointestinal: Soft and nontender. Normal Bowel sounds. No CVA tenderness. Musculoskeletal:No midline cervical, thoracic or lumbar tenderness to palpation.  Neurologic:  Normal speech and language. Speech is normal. No gait instability.  Skin:  Skin is warm, dry and intact. No rash noted. Psychiatric: Mood and affect are normal. Speech and behavior are normal. Patient exhibits appropriate insight and judgment   ___________________________________________   LABS (all labs ordered are listed, but only abnormal results are displayed)  Labs Reviewed  CBC WITH DIFFERENTIAL/PLATELET - Abnormal; Notable for the following components:      Result Value   RBC 4.26 (*)    HCT 39.4 (*)    All other components within normal limits  BASIC METABOLIC PANEL - Abnormal; Notable for the following components:   Sodium 134 (*)    Chloride 100 (*)    All other components within normal limits    RADIOLOGY  No results found. ____________________________________________   PROCEDURES Procedures    INITIAL IMPRESSION / ASSESSMENT AND PLAN / ED COURSE  Pertinent labs & imaging results that were available during my care of the patient were reviewed by me and considered in my medical decision making (see chart for details).  Very well-appearing patient.  No acute distress.  Intermittent diarrhea for the last few days.  Patient denies associated weakness, blood in stool, fevers or associated abdominal pain.  patient well-appearing, will evaluate labs as well recommend evaluation of stool panel.  Discussed in detail with patient no clear indication for antibiotic use at this time.  Labs reviewed and overall unremarkable, discussed with  patient.  Patient was unable to have diarrheal episode in urgent care, stool samples sent with patient and directed to return.  Encouraged brat diet,  increase water intake and supportive care.  Over-the-counter medication as needed in moderation.  Discussed strict follow-up and return parameters.   Discussed follow up with Primary care physician this week. Discussed follow up and return parameters including no resolution or any worsening concerns. Patient verbalized understanding and agreed to plan.   ____________________________________________   FINAL CLINICAL IMPRESSION(S) / ED DIAGNOSES  Final diagnoses:  Diarrhea, unspecified type     ED Discharge Orders    None       Note: This dictation was prepared with Dragon dictation along with smaller phrase technology. Any transcriptional errors that result from this process are unintentional.         Marylene Land, NP 08/05/17 [phone]

## 2017-08-06 ENCOUNTER — Other Ambulatory Visit
Admission: RE | Admit: 2017-08-06 | Discharge: 2017-08-06 | Disposition: A | Discharge status: Home / Self Care | Payer: Medicare Other | Source: Ambulatory Visit | Attending: Family Medicine | Admitting: Family Medicine

## 2017-08-06 DIAGNOSIS — R197 Diarrhea, unspecified: Secondary | ICD-10-CM | POA: Diagnosis present

## 2017-08-06 LAB — C DIFFICILE QUICK SCREEN W PCR REFLEX
C DIFFICILE (CDIFF) INTERP: NOT DETECTED
C DIFFICILE (CDIFF) TOXIN: NEGATIVE
C DIFFICLE (CDIFF) ANTIGEN: NEGATIVE

## 2017-08-07 ENCOUNTER — Telehealth: Payer: Self-pay

## 2017-08-07 LAB — GASTROINTESTINAL PANEL BY PCR, STOOL (REPLACES STOOL CULTURE)
ASTROVIRUS: NOT DETECTED
Adenovirus F40/41: NOT DETECTED
CYCLOSPORA CAYETANENSIS: NOT DETECTED
Campylobacter species: NOT DETECTED
Cryptosporidium: NOT DETECTED
E. COLI O157: NOT DETECTED
ENTAMOEBA HISTOLYTICA: NOT DETECTED
ENTEROTOXIGENIC E COLI (ETEC): NOT DETECTED
Enteroaggregative E coli (EAEC): NOT DETECTED
GIARDIA LAMBLIA: NOT DETECTED
Norovirus GI/GII: NOT DETECTED
PLESIMONAS SHIGELLOIDES: NOT DETECTED
Rotavirus A: NOT DETECTED
SALMONELLA SPECIES: NOT DETECTED
Sapovirus (I, II, IV, and V): NOT DETECTED
Shiga like toxin producing E coli (STEC): DETECTED — AB
Shigella/Enteroinvasive E coli (EIEC): NOT DETECTED
VIBRIO SPECIES: NOT DETECTED
Vibrio cholerae: NOT DETECTED
YERSINIA ENTEROCOLITICA: NOT DETECTED

## 2017-08-07 NOTE — Telephone Encounter (Signed)
Tried calling patient. No answer. LM for patient to call back. I have sent Communicable disease report to health department.

## 2017-08-07 NOTE — Telephone Encounter (Signed)
Angel Moses with [facility] main lab called with a critical lab value Patient is + for Shiga Toxin E-Coli. Patient has not been treated for this.

## 2017-08-07 NOTE — Telephone Encounter (Signed)
Check on patient. Per current recommendations, no treatment indicated.

## 2017-08-08 NOTE — Telephone Encounter (Signed)
Tried calling patient again. LM Voicemail. Will mail letter but will try again to reach patient.

## 2017-08-09 NOTE — Telephone Encounter (Signed)
Patient returned call. Advised patient of results and no treatment needed. Patient advised that the health department may be calling him. Asked patient if he had eaten any Romaine lettuce. Patient states he doesn't know. Patient has been using baking soda in water and drinking a little each morning and states he is feeling much better. Advised follow up with PCP or ED if symptoms change or worsen. Patient voiced understanding.

## 2017-08-11 ENCOUNTER — Ambulatory Visit
Admission: RE | Admit: 2017-08-11 | Discharge: 2017-08-11 | Disposition: A | Discharge status: Home / Self Care | Payer: MEDICARE

## 2017-08-11 DIAGNOSIS — R972 Elevated prostate specific antigen [PSA]: Principal | ICD-10-CM

## 2017-09-01 ENCOUNTER — Ambulatory Visit
Admission: RE | Admit: 2017-09-01 | Discharge: 2017-09-14 | Disposition: A | Discharge status: Home / Self Care | Payer: MEDICARE

## 2017-09-01 ENCOUNTER — Other Ambulatory Visit: Payer: Self-pay

## 2017-09-01 ENCOUNTER — Ambulatory Visit
Admission: EM | Admit: 2017-09-01 | Discharge: 2017-09-01 | Disposition: A | Discharge status: Home / Self Care | Payer: Medicare Other

## 2017-09-01 DIAGNOSIS — C61 Malignant neoplasm of prostate: Principal | ICD-10-CM

## 2017-09-01 DIAGNOSIS — C7951 Secondary malignant neoplasm of bone: Secondary | ICD-10-CM

## 2017-09-01 DIAGNOSIS — Z4800 Encounter for change or removal of nonsurgical wound dressing: Secondary | ICD-10-CM | POA: Diagnosis not present

## 2017-09-01 NOTE — ED Provider Notes (Signed)
[facility]    CSN: [id number] Arrival date & time: 09/01/17  1912     History   Chief Complaint Chief Complaint  Patient presents with  . IV left in arm    HPI Angel Moses is a 71 y.o. male.   71 year old male presents with IV still in place at right antecubital space. 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 They placed an IV in his right arm and then proceeded to place another IV in his left arm. They removed the IV in his left arm but did not remove the right arm IV. He did not realize he still had the IV in place (it was under an ace wrap) until he got home. He is requesting removal of the IV here tonight.    The history is provided by the patient.    Past Medical History:  Diagnosis Date  . Cancer ([place])   . COPD (chronic obstructive pulmonary disease) ([place])   . Elevated PSA     There are no active problems to display for this patient.   Past Surgical History:  Procedure Laterality Date  . PROSTATE CRYOABLATION         Home Medications    Prior to Admission medications   Not on File    Family History Family History  Problem Relation Age of Onset  . Diabetes Mother   . Diabetes Father   . Cancer Other     Social History Social History   Tobacco Use  . Smoking status: Current Every Day Smoker    Packs/day: 1.00    Years: 25.00    Pack years: 25.00    Types: Cigarettes  . Smokeless tobacco: Never Used  Substance Use Topics  . Alcohol use: Yes    Alcohol/week: 0.0 oz    Comment: 12 pack  . Drug use: No     Allergies   Patient has no known allergies.   Review of Systems Review of Systems  Constitutional: Negative for chills and fever.  Musculoskeletal: Negative for arthralgias, joint swelling and myalgias.  Skin: Negative for color change, rash and wound.  Allergic/Immunologic: Positive for immunocompromised state.  Neurological: Negative  for tremors, weakness and numbness.  Hematological: Does not bruise/bleed easily.     Physical Exam Triage Vital Signs ED Triage Vitals  Enc Vitals Group     BP 09/01/17 1924 127/71     Pulse Rate 09/01/17 1924 87     Resp 09/01/17 1924 18     Temp 09/01/17 1924 98.6 F (37 C)     Temp Source 09/01/17 1924 Oral     SpO2 09/01/17 1924 95 %     Weight 09/01/17 1922 223 lb (101.2 kg)     Height 09/01/17 1922 5\' 9"  (1.753 m)     Head Circumference --      Peak Flow --      Pain Score 09/01/17 1923 3     Pain Loc --      Pain Edu? --      Excl. in [place]? --    No data found.  Updated Vital Signs BP 127/71 (BP Location: Left Arm)   Pulse 87   Temp 98.6 F (37 C) (Oral)   Resp 18   Ht 5\' 9"  (1.753 m)   Wt 223 lb (101.2 kg)   SpO2 95%   BMI 32.93  kg/m   Visual Acuity Right Eye Distance:   Left Eye Distance:   Bilateral Distance:    Right Eye Near:   Left Eye Near:    Bilateral Near:     Physical Exam  Constitutional: He is oriented to person, place, and time. He appears well-developed and well-nourished. No distress.  HENT:  Head: Normocephalic and atraumatic.  Eyes: Conjunctivae and EOM are normal.  Musculoskeletal: Normal range of motion. He exhibits no edema or tenderness.       Right elbow: Normal.He exhibits normal range of motion, no swelling, no effusion, no deformity and no laceration. No tenderness found.       Arms: IV catheter still in place at right antecubital space. No redness, swelling or irritation at IV site. No signs of infection.   Neurological: He is alert and oriented to person, place, and time. He has normal strength. No sensory deficit.  Skin: Skin is warm and dry. No erythema.  Psychiatric: He has a normal mood and affect. His behavior is normal.     UC Treatments / Results  Labs (all labs ordered are listed, but only abnormal results are displayed) Labs Reviewed - No data to display  EKG  EKG Interpretation None        Radiology No results found.  Procedures Procedures (including critical care time)  Medications Ordered in UC Medications - No data to display   Initial Impression / Assessment and Plan / UC Course  I have reviewed the triage vital signs and the nursing notes.  Pertinent labs & imaging results that were available during my care of the patient were reviewed by me and considered in my medical decision making (see chart for details).    IV removed by Orlie Dakin, RN with ease. Bleeding controlled. Bandage applied. Patient to follow-up at [facility] as planned.   Final Clinical Impressions(s) / UC Diagnoses   Final diagnoses:  Malignant neoplasm of prostate ([place])  Encounter for change or removal of nonsurgical wound dressing    ED Discharge Orders    None       Controlled Substance Prescriptions [place] Controlled Substance Registry consulted? Not Applicable   Katy Apo, NP 09/01/17 2318

## 2017-09-01 NOTE — ED Triage Notes (Signed)
Patient reports that he went to [facility] today for a CT and a bone scan. Patient states that they left his IV in his right antecubital space. Patient states that he didn't realize this was left in his arm til he got home from his errands.

## 2017-09-01 NOTE — ED Notes (Signed)
IV removed from Right AC.

## 2017-09-01 NOTE — Discharge Instructions (Signed)
Your IV catheter was removed here at Urgent Care. Follow-up at [facility] as planned.

## 2017-09-27 LAB — MISCELLANEOUS TEST

## 2017-11-07 ENCOUNTER — Ambulatory Visit: Admit: 2017-11-07 | Discharge: 2017-11-07 | Payer: MEDICARE | Attending: Urology | Primary: Urology

## 2017-11-07 DIAGNOSIS — C61 Malignant neoplasm of prostate: Principal | ICD-10-CM

## 2017-11-07 DIAGNOSIS — C7951 Secondary malignant neoplasm of bone: Secondary | ICD-10-CM

## 2017-11-07 DIAGNOSIS — N320 Bladder-neck obstruction: Secondary | ICD-10-CM

## 2017-12-11 ENCOUNTER — Ambulatory Visit: Admit: 2017-12-11 | Discharge: 2017-12-12 | Payer: MEDICARE

## 2017-12-12 ENCOUNTER — Ambulatory Visit
Admit: 2017-12-12 | Discharge: 2017-12-12 | Payer: MEDICARE | Attending: Hematology & Oncology | Primary: Hematology & Oncology

## 2017-12-12 DIAGNOSIS — C61 Malignant neoplasm of prostate: Secondary | ICD-10-CM

## 2017-12-12 DIAGNOSIS — Z87891 Personal history of nicotine dependence: Secondary | ICD-10-CM

## 2017-12-23 ENCOUNTER — Ambulatory Visit: Admit: 2017-12-23 | Discharge: 2017-12-24 | Payer: MEDICARE

## 2017-12-23 DIAGNOSIS — Z87891 Personal history of nicotine dependence: Principal | ICD-10-CM

## 2017-12-23 DIAGNOSIS — C61 Malignant neoplasm of prostate: Principal | ICD-10-CM

## 2017-12-25 MED ORDER — ENZALUTAMIDE 40 MG CAPSULE
ORAL_CAPSULE | Freq: Every day | ORAL | 2 refills | 0.00000 days | Status: CP
Start: 2017-12-25 — End: 2018-01-12

## 2017-12-28 ENCOUNTER — Ambulatory Visit
Admit: 2017-12-28 | Discharge: 2017-12-29 | Payer: MEDICARE | Attending: Hematology & Oncology | Primary: Hematology & Oncology

## 2017-12-28 DIAGNOSIS — C61 Malignant neoplasm of prostate: Principal | ICD-10-CM

## 2018-01-12 MED ORDER — ENZALUTAMIDE 40 MG CAPSULE
Freq: Every day | ORAL | 2 refills | 0.00000 days | Status: CP
Start: 2018-01-12 — End: 2018-01-12

## 2018-01-12 MED ORDER — ENZALUTAMIDE 40 MG CAPSULE: 160 mg | capsule | Freq: Every day | 2 refills | 0 days | Status: AC

## 2018-01-12 MED ORDER — ENZALUTAMIDE 40 MG CAPSULE: 160 mg | each | 2 refills | 0 days

## 2018-01-12 NOTE — Unmapped (Signed)
Per test claim for [facility] at the [facility], patient needs Medication Assistance Program for High Copay.

## 2018-01-12 NOTE — Unmapped (Signed)
The patient calls the clinic to report the following:    ?? The associated co-pay for the patient's enzalutamide prescription from his local [facility] is [phone].    ?? Patient has indicated the medication is cost prohibitive at this time and has not started the therapy.    ?? A new prescription for the enzalutamide has been sent to the [facility]    ?? After speaking with the [facility] today, they will initiate their own benefits investigation and enroll the patient in the most appropriate support program to offset the cost of the medication.    ?? I have contacted the patient and left a message on his answering machine indicating that he may receive a phone call from the [facility] in the concerns listed above.    The patient need any additional support from our clinic, he has been advised to call back.    No other actions taken at this time and Dr. Claude Manges is aware.

## 2018-01-21 NOTE — Unmapped (Deleted)
Follow up Visit Note    Patient Name: Brandon Olsen  Patient Age: 72 y.o.  Encounter Date: 01/22/2018    Referring Physician:     Towanda Malkin, MD  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  I have asked the patient to stop bicalutamide.  We will recheck PSA today, and plan to recheck in 1 month.  In the meantime, will obtain chest CT (as screening for lung disease in a patient with a greater than 35-pack-year smoking history and ongoing use of inhaled tobacco products), and an MRI of the abdomen pelvis to evaluate liver nodules, and pelvic adenopathy.  If these studies do not suggest disease outside of the prosthetic bed, I would consider radiation and apalutamide .  In the SPARTAN study (Lancet Oncol 2018), patients were enrolled with PSA doubling time of 10 months or less, pelvic lymph nodes smaller than 2 cm in the short axis.  If more extensive disease is present, the patient may be considered for Provenge, or next generation antiandrogen (Zytiga or Xtandi).  This information was discussed with the patient and his family.  Ample time was given to answer all of their questions.  Consultation lasted 90 minutes, with 75 minutes of face-to-face consultation.  --Patient has been instructed to stop taking calcium supplements.      Dr Gus Height:  I briefly reviewed his chart and I'm not sure that I am enthusiastic about Provenge, given his relatively rapid PSA rise from 1 to 44 in one year and the current PSA of 44.  My usual cutoff for PSA is about 22, based on the [place] paper on the phase 3 sipuleucel-T trial data.    Having said that, it would be good to have him evaluated in [facility] for two reasons:  1) To do tumor and germline testing.  Now, the new NCCN guideline recommends germline testing of all metastatic prostate cancer patients.  I have a genetic counselor in my clinic on Thursdays.    2) Consider a trial of abiraterone/apalutamide Social worker) as first line therapy.  Study provides drug.          I have reviewed the laboratory, pathology, and radiology reports in detail and discussed findings with the patient    History of Present Illness:     Brandon Olsen is a 72 y.o. male who is seen in consultation at the request of Lonn Georgia* for an evaluation of Prostate Cancer.  Patient was diagnosed in 2007, with Gleason 7 (4+3) right face, right mid medial, right mid lateral and left mid lateral high-grade PIN; PSA 5.7 with 6% free PSA .  He was treated with cryoablation.  He was without evidence of metastatic disease.  He did well, but was noted to have elevated PSA at  least as early as June 2014 (PSA 6.9).  He was followed without therapy until 10/14/2014, the PSA reached 11.6.  He was started on bicalutamide and Lupron, and has been on these agents since.  PSA was low until 10/26/2016, when it started to rise, first to 1.28, then on 08/11/2017 to 6.56, and on 11/07/17, 22.6 ng/mL.  In December 2018 imaging showed no bony metastases but possible pelvic adenopathy and questionable nodules in the liver.  Patient states that he still feels well without complaint.  He denies difficulty passing stool or urine, and maintains that he has a good appetite.  He continues to smoke at least 1 cigar a day, and has a loose cough.  States that his weight has been stable.  He has mild and infrequent hot flashes.  The incidence of hot flashes has not decreased in the past year.      Oncology History:       Malignant neoplasm of prostate (CMS-HCC)    01/13/2006 Biopsy     Gleason's 7 ( 4  3) right face, right mid medial, right mid lateral in left mid lateral high-grade PIN.  PSA 5.7 with 6% free PSA          01/24/2006 -  Cancer Staged       Bone scan:  No evidence of metastatic disease         02/02/2006 -  Cancer Staged       CT AP:  Horseshoe kidney present         03/22/2006 Surgery     Cryoablation         12/02/2009 -  Cancer Staged       MRI Pelvis:  No definite abnormality noted within the prostate.   2. Possible rectal lesion along the anterior wall of the rectum. Direct   visualization of the rectum is suggested for further evaluation.          02/25/2010 -  Cancer Staged       PET scan:  Findings concerning for bony metastatic involvement in the anterior right   proximal femur and in the left side of the sacrum as described. Other   areas   are in regions of degenerative changes especially in the lumbar and   cervical   spine areas.         09/01/2017 -  Cancer Staged       CT AP:  Bilateral prominent subcentimeter pelvic sidewall lymph nodes which are indeterminate but early metastatic disease cannot be excluded. Attention on follow-up.    Multiple hepatic hypodensities, these have CT appearance of simple cysts and biliary hamartomas. This could be further evaluated with MRI.    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    A 2.6 cm hypodense lesion is detected in the subcutaneous tissues of the right gluteal region. This lesion centrally demonstrates low attenuation close to water. Could be the sequela of fat necrosis or a small fluid collection secondary to injection. The lesion demonstrates no obvious rim enhancement or associated stranding. Correlation with clinical findings is recommended to exclude an abscess.  ??         09/01/2017 -  Cancer Staged Bone scan:    No evidence of osseous metastatic disease.                 12/12/2017 -  Cancer Staged       MRI AP:  --Irregular prostate mass with extracapsular extension consistent with primary  malignancy.  --Metastatic bilateral iliac and lower para-aortic lymphadenopathy.  --Vertebral and pelvic bone metastases.  --Peripherally enhancing fluid collection in the lateral aspect of the right gluteus maximus is favored to be posttraumatic in nature. Abscess could have this appearance in appropriate clinical setting.         12/12/2017 -  Cancer Staged       LD Screening Chest CT:  New lytic lesions in the thoracic spine, compatible with metastases, as suggested on same-day MRI of the abdomen and pelvis.    -No pulmonary nodules identified.            Past Medical History:   Diagnosis Date   ??? Congenital vascular abnormality    ??? COPD (chronic obstructive pulmonary disease) (CMS-HCC)    ??? Impotence of organic origin    ??? Inguinal hernia without mention of obstruction or gangrene, unilateral or unspecified, (not specified as recurrent)    ??? Malignant neoplasm of prostate (CMS-HCC)     01/16/2006 Gleason's 7 ( 4  3) right face, right mid medial, right mid lateral in left mid lateral high-grade PIN.  PSA 5.7 with 6% free PSA    ??? 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 Unspecified congenital anomaly of urinary system    ??? Unspecified hemorrhoids without mention of complication       Past Surgical History:   Procedure Laterality Date   ??? HEMORRHOID SURGERY     ??? PROSTATE CRYOABLATION      03/23/2006 left nerve sparing   ??? Seminal Vessicle Biopsy      12/22/2009        Family History   Problem Relation Age of Onset   ??? Prostate cancer Brother    ??? Heart disease Mother    ??? Diabetes Mother    ??? Diabetes Sister    ??? Hypertension Sister    ??? Heart disease Brother    ??? Sarcoidosis Son    ??? Kidney cancer Neg Hx    ??? GU problems Neg Hx    ??? Urolithiasis Neg Hx       Family Status   Relation Name Status   ??? Brother  Alive   ??? Mother  Deceased   ??? Father  Deceased   ??? Sister  Alive   ??? Sister  Alive   ??? Sister  Alive   ??? Sister  Alive   ??? Sister  Alive   ??? Sister  Alive   ??? Brother  Deceased   ??? Brother  Deceased   ??? Brother  Deceased   ??? Son  Alive   ??? Neg Hx  (Not Specified)     Social History     Occupational History   ??? Not on file   Tobacco Use   ??? Smoking status: Current Every Day Smoker     Packs/day: 1.00     Years: 50.00     Pack years: 50.00     Types: Cigars   ??? Smokeless tobacco: Never Used   ??? Tobacco comment: 1 cigar a day   Substance and Sexual Activity   ??? Alcohol use: Yes     Alcohol/week: 11.4 oz     Types: 12 Cans of beer, 7 Shots of liquor per week   ??? Drug use: No   ??? Sexual activity: Not on file       No Known Allergies  Current Outpatient Medications   Medication Sig Dispense Refill   ??? ADVAIR DISKUS 250-50 mcg/dose diskus      ??? albuterol (VENTOLIN HFA) 90 mcg/actuation inhaler Inhale.     ??? ascorbic acid (VITAMIN C) 1000 MG tablet Take 1,000 mg by mouth daily.     ??? aspirin (ECOTRIN) 81 MG tablet Take 81 mg by mouth. Frequency:QD   Dosage:81   MG  Instructions:  Note:Dose: 81MG      ??? bicalutamide (CASODEX) 50 MG tablet Take 1 tablet (50 mg total) by mouth daily. 90 tablet 3   ??? budesonide-formoterol (SYMBICORT) 80-4.5 mcg/actuation inhaler Inhale.     ??? cannabidiol, CBD, extract 100 mg/mL Soln Take by mouth daily at 10am.     ??? cholecalciferol, vitamin D3, (CHOLECALCIFEROL) 1,000 unit tablet Take by mouth.     ??? diaper,brief,adult,disposable Misc Use one diaper every 4 hours, sooner as needed     ??? enzalutamide (XTANDI) 40 mg capsule Take 4 capsules (160 mg total) by mouth daily. 120 capsule 2   ??? fluticasone (FLONASE) 50 mcg/actuation nasal spray 2 sprays into each nostril.     ??? garlic 1 mg cap Take by mouth.     ??? ginger, zingiber officinalis, (GINGER EXTRACT) 250 mg cap Take by mouth.     ??? glucosamine sulfate (GLUCOSAMINE) 500 mg Tab Take 1 tablet by mouth once daily.     ??? ibuprofen (ADVIL,MOTRIN) 800 MG tablet TAKE ONE TABLET BY MOUTH EVERY 8 HOURS AS NEEDED FOR PAIN 90 tablet 4   ??? multivitamin capsule Take 1 capsule by mouth daily.     ??? omega-3 fatty acids-fish oil 340-1,000 mg cap Take 1 capsule by mouth.     ??? omeprazole (PRILOSEC) 20 MG capsule Take 20 mg by mouth.     ??? saw palmetto 160 mg cap Take 160 mg by mouth.     ??? vitamin E 400 UNIT capsule Take 400 Units by mouth daily.     ??? zinc-pumpkin seed oil-saw palm 7.5-40-160 mg cap Take by mouth.       No current facility-administered medications for this visit.        The following portions of the patient's history were reviewed and updated as appropriate: allergies, current medications, past family history, past medical history, past social history, past surgical history and problem list.     Review of Systems - Oncology      Vital Signs for this encounter:  BSA: There is no height or weight on file to calculate BSA.  There were no vitals taken for this visit.    Physical Exam   Constitutional: He is oriented to person, place, and time. He appears well-developed and well-nourished. No distress.   HENT:   Head: Normocephalic.   Mouth/Throat: Oropharynx is clear and moist.   Eyes: Pupils are equal, round, and reactive to light. EOM are normal.   Neck: No JVD present. No thyromegaly present.   Cardiovascular: Normal rate, regular rhythm and normal heart sounds.   Pulmonary/Chest: Effort normal and breath sounds normal.   Abdominal: Bowel sounds are normal. He exhibits no distension and no mass. There is no tenderness.   Genitourinary: Rectum normal. Rectal exam shows guaiac negative stool.   Genitourinary Comments: There was a rock hard nodule, about 1.5 cm - 2 cm in the prostate bed.   Musculoskeletal: He exhibits no edema.   Lymphadenopathy:   No adenopathy in the neck, supraclavicular, axillary or inguinal regions.   Neurological: He is alert  and oriented to person, place, and time. No cranial nerve deficit. He exhibits normal muscle tone. Coordination normal.   Skin: No rash noted.   Psychiatric: He has a normal mood and affect. His behavior is normal.       Karnofsky/Lansky Performance Status  90,  Able to carry on normal activity; minor signs or symptoms of disease (ECOG equivalent 0)    Results:    WBC   Date Value Ref Range Status   12/12/2017 5.6 4.5 - 11.0 10*9/L Final     HGB   Date Value Ref Range Status   12/12/2017 13.4 (L) 13.5 - 17.5 g/dL Final     HCT   Date Value Ref Range Status   12/12/2017 40.7 (L) 41.0 - 53.0 % Final     Platelet   Date Value Ref Range Status   12/12/2017 236 150 - 440 10*9/L Final     Creatinine Whole Blood, POC   Date Value Ref Range Status   09/01/2017 0.8 0.8 - 1.4 mg/dL Final     Creatinine   Date Value Ref Range Status   12/12/2017 0.88 0.70 - 1.30 mg/dL Final     AST   Date Value Ref Range Status   12/12/2017 27 19 - 55 U/L Final     PSA Diagnostic   Date Value Ref Range Status   11/25/2014 0.7 0.0 - 4.0 ng/mL Final   10/14/2014 11.3 (H) 0.0 - 4.0 ng/mL Final   07/11/2014 5.8 (H) 0.0 - 4.0 ng/mL Final   04/09/2014 9.9 (H) 0.0 - 4.0 ng/mL Final

## 2018-02-07 NOTE — Unmapped (Signed)
[facility] Specialty Medication Referral: PA APPROVED    Medication (Brand/Generic): XTANDI    Initial FSI Test Claim completed with resulted information below:  No PA required  Patient ABLE to fill at [facility]  Insurance Company:  [id number]  Anticipated Copay: $0  Is anticipated copay with a copay card or grant? YES    As Co-pay is under $100 defined limit, per policy there will be no further investigation of need for financial assistance at this time unless patient requests. This referral has been communicated to the provider and handed off to the [facility] team for further processing and filling of prescribed medication.   ______________________________________________________________________  Please utilize this referral for viewing purposes as it will serve as the central location for all relevant documentation and updates.

## 2018-02-09 MED FILL — XTANDI/40MG/CAPS: XTANDI/40MG/CAPS | 30 days supply | Qty: 120 | Fill #0

## 2018-02-09 NOTE — Unmapped (Signed)
[facility]   Patient Onboarding/Medication Counseling    Mr.Brandon Olsen is a 72 y.o. male with prostate cancer who I am counseling today on initiation of therapy.    Medication: Xtandi 40 mg    Verified patient's date of birth / HIPAA.      Education Provided: ??    Dose/Administration discussed: Xtandi 40 mg 4 capsules by mouth daily. This medication should be taken  without regard to food.  Stressed the importance of taking medication as prescribed and to contact provider if that changes at any time.  Discussed missed dose instructions.    Storage requirements: this medicine should be stored at room temperature.     Side effects / precautions discussed: Discussed common side effects, including allergic reaction, dizziness, feeling tired, diarrhea, constipation, joint pain, weight loss, change in taste, increase in blood pressure. If patient experiences blood in the urine, a fall, SOB, chest pain, a burning, numbness or tingling feeling, they need to call the doctor.  Patient will receive a drug information handout with shipment.    Handling precautions / disposal reviewed:  n/a.    Drug Interactions: other medications reviewed and up to date in Epic.  No drug interactions identified, but counseled patients to avoid grapefruit and grapefruit juice.    Comorbidities/Allergies: reviewed and up to date in Epic.    Verified therapy is appropriate and should continue      Delivery Information    Medication Assistance provided: Prior Authorization    Anticipated copay of $0 reviewed with patient. Verified delivery address in FSI and reviewed medication storage requirement.    Scheduled delivery date: 02/12/18    Explained that we ship using UPS or courier and this shipment will not require a signature.      Explained the services we provide at [facility] and that each month we would call to set up refills.  Stressed importance of returning phone calls so that we could ensure they receive their medications in time each month.  Informed patient that we should be setting up refills 7-10 days prior to when they will run out of medication.  Informed patient that welcome packet will be sent.      Patient verbalized understanding of the above information as well as how to contact the pharmacy at [phone] option 4 with any questions/concerns.  The pharmacy is open Monday through Friday 8:30am-4:30pm.  A pharmacist is available 24/7 via pager to answer any clinical questions they may have.        Patient Specific Needs      ? Patient has no physical, cognitive, or cultural barriers.    ? Patient prefers to have medications discussed with  Patient     ? Patient is able to read and understand education materials at a high school level or above.    ? Patient's primary language is  English           Child psychotherapist  [facility] Specialty PharmD Candidate

## 2018-02-13 NOTE — Unmapped (Signed)
This pt's son/Brandon Olsen call today.  He would like to know the staging on the pt's prostate cancer.  He would like to know the initial and current staging?  He is on the pt's Limited Release of Information Form./tsp

## 2018-02-15 NOTE — Unmapped (Signed)
Nurses, please notify pt's son of this information in case he has questions./tsp

## 2018-02-15 NOTE — Unmapped (Signed)
T2b originally  Now T4

## 2018-02-16 ENCOUNTER — Ambulatory Visit: Admit: 2018-02-16 | Discharge: 2018-02-17 | Payer: MEDICARE | Attending: MS" | Primary: MS"

## 2018-02-16 DIAGNOSIS — Z8042 Family history of malignant neoplasm of prostate: Secondary | ICD-10-CM

## 2018-02-16 DIAGNOSIS — C61 Malignant neoplasm of prostate: Principal | ICD-10-CM

## 2018-02-16 NOTE — Unmapped (Addendum)
[facility]ation    Patient Name: Brandon Olsen  Patient Age: 72 y.o.  Encounter Date: 02/16/2018    Referring Provider:  Maurie Boettcher, MD  [address]  [id number]  [place], [place] [address]    Reason for Consultation:  Evaluate for hereditary cancer susceptibility    Primary Care Provider:  Marina Goodell, MD    Brandon Olsen, a 72 y.o. male, is being seen at the [facility] due to a personal history of metastatic prostate cancer. Brandon Olsen presents to clinic today to discuss the possibility of a hereditary predisposition to cancer and whether genetic testing is warranted.      ASSESSMENT AND PLAN: Brandon Olsen is a 72 y.o. male with a personal history of metastatic prostate cancer. This history is somewhat suggestive of a hereditary predisposition to cancer. We therefore discussed the following:    1. We recommend Brandon Olsen pursue testing of the [facility] prostate cancer panel, which analyzes 12 genes associated with hereditary prostate cancer, including BRCA1 and BRCA2. Brandon Olsen provided informed consent, and his blood sample will be sent to [facility]. Results should be available in approximately 2-3 weeks, at which point we will contact him by phone and address implications for him, as well as at-risk family members.    2. A positive result may indicate changes to Brandon Olsen management, such as consideration of treatment changes. It could also provide important information for his at-risk relatives.  3. A negative result would be reassuring. His immediate male relatives, including his son, would remain at somewhat increased risk to develop prostate cancer based on the family history alone.      HISTORY OF PRESENT ILLNESS:  Brandon Olsen was diagnosed with prostate cancer (Gleason 4+3) at 18. He was diagnosed with bone metastases in 2011 and CRPC in May 2018. For full family history information, please see below. In brief, Brandon Olsen has somewhat limited information; however, he does know that his brother was also diagnosed with prostate cancer.    Brandon Olsen had his last colonoscopy in 2014, which he reports revealed ~2-3 polyps; pathology was not immediately available. He does not recall what follow-up interval was recommended. He reports having colonoscopies prior to this but does not recall details.    Past Medical History:   Diagnosis Date   ??? Congenital vascular abnormality    ??? COPD (chronic obstructive pulmonary disease) (CMS-HCC)    ??? Impotence of organic origin    ??? Inguinal hernia without mention of obstruction or gangrene, unilateral or unspecified, (not specified as recurrent)    ??? Malignant neoplasm of prostate (CMS-HCC)     01/16/2006 Gleason's 7 ( 4  3) right face, right mid medial, right mid lateral in left mid lateral high-grade PIN.  PSA 5.7 with 6% free PSA    ??? 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 Unspecified congenital anomaly of urinary system    ??? Unspecified hemorrhoids without mention of complication        Past Surgical History:   Procedure Laterality Date   ??? HEMORRHOID SURGERY     ??? PROSTATE CRYOABLATION      03/23/2006 left nerve sparing   ??? Seminal Vessicle Biopsy      12/22/2009       Social History     Socioeconomic History   ??? Marital status: Married     Spouse  name: Not on file   ??? Number of children: Not on file   ??? Years of education: Not on file   ??? Highest education level: Not on file   Occupational History   ??? Not on file   Social Needs   ??? Financial resource strain: Not on file   ??? Food insecurity:     Worry: Not on file     Inability: Not on file   ??? Transportation needs:     Medical: Not on file     Non-medical: Not on file   Tobacco Use   ??? Smoking status: Current Every Day Smoker     Packs/day: 1.00     Years: 50.00     Pack years: 50.00     Types: Cigars   ??? Smokeless tobacco: Never Used   ??? Tobacco comment: 1 cigar a day   Substance and Sexual Activity   ??? Alcohol use: Yes Alcohol/week: 11.4 oz     Types: 12 Cans of beer, 7 Shots of liquor per week   ??? Drug use: No   ??? Sexual activity: Not on file   Lifestyle   ??? Physical activity:     Days per week: Not on file     Minutes per session: Not on file   ??? Stress: Not on file   Relationships   ??? Social connections:     Talks on phone: Not on file     Gets together: Not on file     Attends religious service: Not on file     Active member of club or organization: Not on file     Attends meetings of clubs or organizations: Not on file     Relationship status: Not on file   Other Topics Concern   ??? Not on file   Social History Narrative   ??? 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 He previously worked at [employer] for 26 years.      FAMILY HISTORY:   During the visit, a 4-generation pedigree was obtained.    Brandon Olsen has two children. Significant reported family history information includes the following:   ?? No known cancer diagnoses  ?? Son, 40s. He has a daughter (granddaughter to Brandon Olsen), ~23  ?? Daughter, 30s. No children    Brandon Olsen had 12 siblings. Significant reported family history information includes the following:   ?? Brother, ~80s, diagnosed with prostate cancer at unknown age  ?? No known cancer diagnoses in other relatives, but Brandon Olsen shares that many of them are private about their health  ?? Six sisters, age range ~72 to ~50s  ?? Five additional brothers, all d. >50  ?? Multiple nieces and nephews    Significant reported maternal family history information includes the following:  ?? No known cancer diagnoses  ?? Mother, d. ~36s  ?? Two uncles, d. ~64s  ?? At least one aunt, d. ~40s  ?? Grandparents, d. unknown ages    Significant reported paternal family history information includes the following:  ?? Father, d. ~46s, no known cancer diagnoses  ?? Uncle, d. ~29s, diagnosed with unknown type of cancer at unknown age  ?? No known cancer diagnoses in other relatives  ?? Three additional uncles, d. >50  ?? Aunt, d. >50  ?? Grandparents, d. ~53s to 12s    Mr. Ortner ancestry is African-American. There is no known Jewish ancestry.  DISCUSSION: Mr. Nghiem is a 72 y.o. male with a personal history of metastatic prostate cancer. This history is somewhat suggestive of a hereditary predisposition to cancer, in that current data suggests perhaps up to ~10% of metastatic prostate cancers are hereditary. Identifying hereditary prostate cancer can be important for treatment planning and for cascade testing in relatives. We therefore recommend Mr. Purdy pursue testing of 12 genes associated with hereditary prostate cancer, including BRCA1 and BRCA2.    We reviewed the characteristics, features, and inheritance patterns of hereditary cancer syndromes. We also discussed genetic testing, including the process of testing, insurance coverage, and implications of a positive, negative, and/or variant of uncertain significance (VUS) result.     Most hereditary prostate cancer is??thought to be??associated with pathogenic variants in BRCA1/2. Men who carry a pathogenic variant in BRCA1/2 are at somewhat increased lifetime risk for prostate cancer and male breast cancer, while women who carry a pathogenic variant in BRCA1/2 are at significantly increased lifetime risk of breast and ovarian cancers. Men who are BRCA1/2 positive and have metastatic prostate cancer may be candidates for certain chemotherapy or treatment changes. Mr. Lanphere???s test results could therefore have important implications for him and for his relatives.  ??  Several other genes have also been??suggested to increase lifetime prostate cancer??risks, including highly penetrant genes such as the Lynch syndrome genes.??There is some evidence to suggest a possible association between prostate cancer and low or moderate penetrance genes such as ATM and CHEK2; however, data is still being gathered, and appropriate management implications of some pathogenic variants in these genes remain unclear. A negative result would be reassuring for??Mr. Nibert. However, immediate male??relatives, including his son, would remain at somewhat increased risk to develop prostate??cancer based on the family history alone. We would recommend that his son discuss the potential benefits and limitations of prostate cancer screening with his PCP.    Mr. Inks is encouraged to remain in contact with Cancer Genetics annually so that we can update the family history and inform him of any changes in cancer genetics and testing that may be of benefit for this family. Mr. Biller questions were answered to his satisfaction today. Our contact information [phone]) was provided should additional questions or concerns arise. Thank you for the referral and allowing [place] to share in the care of your patient.     This patient was discussed with Stevie Kern, MD, PhD and he agrees with the above assessment and plan. Total time spent by Alric Ran, MS, CGC in face-to-face counseling was approximately 45 minutes.

## 2018-02-16 NOTE — Unmapped (Signed)
Tried to call pt's son, LVMM.

## 2018-02-19 NOTE — Unmapped (Signed)
The patient's son, Mr. Austad, calls the clinic today to inquire about the following:    ?? As approved by the patient, the patient's son inquired about the patient's current disease status and the treatment plan.    ?? It was reviewed the patient has stage IV prostate cancer with bone metastases.    ?? The patient has also been seen and evaluated by Dr. Regino Schultze who recommended treatment with [name] (enzalutamide) or participation in the [facility] clinical trial at [facility].  The patient has not shown a desire to participate in the PANTHER clinical trial at this time.    ?? The patient's son has confirmed that Mr. Gail has started taking the enzalutamide as prescribed by Dr. Claude Manges.    ?? The patient was seen and evaluated by genetic counseling and the [facility] Prostate Cancer Panel was drawn and sent on 02/16/2018.  Able take approximately 2 to 3 weeks for these results to be available.    ?? A follow-up clinic visit with Dr. Claude Manges is to be scheduled in the next 2 to 3 weeks.    Otherwise, the patient's son expressed verbal understanding and had no other questions or concerns at this time and is agreeable to our plan.  He or his father are to call the clinic back to schedule the follow-up visit with Dr. Claude Manges.    No other actions taken and Dr. Claude Manges is aware.

## 2018-02-19 NOTE — Unmapped (Signed)
Called and spoke to son and given Dr [employer]. Pt son's had some questions about his prognosis for his cancer. Nurse advised that it would be best to speak with Dr Leota Jacobsen office in regards to this question. Son verbalzied understanding.

## 2018-02-24 NOTE — Unmapped (Signed)
Return Visit Note    Patient Name: Brandon Olsen  Patient Age: 72 y.o.  Encounter Date: 02/27/2018    Referring Physician:     Towanda Malkin, MD  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  He has been off of Lupron; he received a 54-month dose in 04/2017.  His PSA has risen further, but ALP, LFTs, CBC and symptoms remain in control.  -- The patient restarted Lupron 22.5 mg IM, and will continue on Xtandi.  -- Discussed Xgeva as an adjunct to therapy; requested that he get dental clearance.  Would consider baseline DEXA scan when therapy initiated.  -- Discussed smoking cessation, the likely link between his loose cough, post nasal drainage and smoking; also discussed ways to treat drainage.    --Patient has been instructed to stop taking calcium supplements.    I have reviewed the laboratory, pathology, and radiology reports in detail and discussed findings with the patient    History of Present Illness:     Brandon Olsen is a 72 y.o. male who is seen in consultation at the request of Lonn Georgia* for an evaluation of Prostate Cancer.  Patient was diagnosed in 2007, with Gleason 7 (4+3) right face, right mid medial, right mid lateral and left mid lateral high-grade PIN; PSA 5.7 with 6% free PSA .  He was treated with cryoablation.  He was without evidence of metastatic disease.  He did well, but was noted to have elevated PSA at least as early as June 2014 (PSA 6.9).  He was followed without therapy until 10/14/2014, the PSA reached 11.6.  He was started on bicalutamide and Lupron, and has been on these agents since.  PSA was low until 10/26/2016, when it started to rise, first to 1.28, then on 08/11/2017 to 6.56, and on 11/07/17, 22.6 ng/mL. His last Lupron injection was a 12-month dose in 04/2017.   In December 2018, imaging showed no bony metastases but possible pelvic adenopathy and questionable nodules in the liver. In 12/2017, imaging with chest CT and Mabd/pelvic MRI showed pelvic adenopathy, and multiple bone mets.   The patient was referred to Dr Regino Schultze for consideration of involvement in a clinical trial, or Provenge.  He was not felt to be a candidate for Provenge, and eventually declined treatment on the study protocal.   Patient states that he still feels well without complaint.  He denies difficulty passing stool or urine, and maintains that he has a good appetite.  He continues to smoke at least 1 cigar a day, and has a loose cough.  States that his weight has been stable.  He has mild and infrequent hot flashes.      Oncology History:       Malignant neoplasm of prostate (CMS-HCC)    01/13/2006 Biopsy  Gleason's 7 ( 4  3) right face, right mid medial, right mid lateral in left mid lateral high-grade PIN.  PSA 5.7 with 6% free PSA          01/24/2006 -  Cancer Staged       Bone scan:  No evidence of metastatic disease         02/02/2006 -  Cancer Staged       CT AP:  Horseshoe kidney present         03/22/2006 Surgery     Cryoablation         12/02/2009 -  Cancer Staged       MRI Pelvis:  No definite abnormality noted within the prostate.   2. Possible rectal lesion along the anterior wall of the rectum. Direct   visualization of the rectum is suggested for further evaluation.          02/25/2010 -  Cancer Staged       PET scan:  Findings concerning for bony metastatic involvement in the anterior right   proximal femur and in the left side of the sacrum as described. Other   areas   are in regions of degenerative changes especially in the lumbar and   cervical   spine areas.         09/01/2017 -  Cancer Staged       CT AP:  Bilateral prominent subcentimeter pelvic sidewall lymph nodes which are indeterminate but early metastatic disease cannot be excluded. Attention on follow-up.    Multiple hepatic hypodensities, these have CT appearance of simple cysts and biliary hamartomas. This could be further evaluated with MRI.    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    A 2.6 cm hypodense lesion is detected in the subcutaneous tissues of the right gluteal region. This lesion centrally demonstrates low attenuation close to water. Could be the sequela of fat necrosis or a small fluid collection secondary to injection. The lesion demonstrates no obvious rim enhancement or associated stranding. Correlation with clinical findings is recommended to exclude an abscess.  ??         09/01/2017 -  Cancer Staged       Bone scan:    No evidence of osseous metastatic disease.                 12/12/2017 -  Cancer Staged       MRI AP:  --Irregular prostate mass with extracapsular extension consistent with primary malignancy.  --Metastatic bilateral iliac and lower para-aortic lymphadenopathy.  --Vertebral and pelvic bone metastases.  --Peripherally enhancing fluid collection in the lateral aspect of the right gluteus maximus is favored to be posttraumatic in nature. Abscess could have this appearance in appropriate clinical setting.         12/12/2017 -  Cancer Staged       LD Screening Chest CT:  New lytic lesions in the thoracic spine, compatible with metastases, as suggested on same-day MRI of the abdomen and pelvis.    -No pulmonary nodules identified.         02/16/2018 -  Cancer Staged     Prostate panel pending at [facility]         02/27/2018 Endocrine/Hormone Therapy     OP LEUPROLIDE (22.5 MG EVERY 3 MONTHS)  Plan Provider: Towanda Malkin, MD            Past Medical History:   Diagnosis Date   ??? Congenital vascular  abnormality    ??? COPD (chronic obstructive pulmonary disease) (CMS-HCC)    ??? Impotence of organic origin    ??? Inguinal hernia without mention of obstruction or gangrene, unilateral or unspecified, (not specified as recurrent)    ??? Malignant neoplasm of prostate (CMS-HCC)     01/16/2006 Gleason's 7 ( 4  3) right face, right mid medial, right mid lateral in left mid lateral high-grade PIN.  PSA 5.7 with 6% free PSA    ??? 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 Unspecified congenital anomaly of urinary system    ??? Unspecified hemorrhoids without mention of complication       Past Surgical History:   Procedure Laterality Date   ??? HEMORRHOID SURGERY     ??? PROSTATE CRYOABLATION      03/23/2006 left nerve sparing   ??? Seminal Vessicle Biopsy      12/22/2009        Family History   Problem Relation Age of Onset   ??? Prostate cancer Brother    ??? Heart disease Mother    ??? Diabetes Mother    ??? Diabetes Sister    ??? Hypertension Sister    ??? Heart disease Brother    ??? Sarcoidosis Son    ??? Kidney cancer Neg Hx    ??? GU problems Neg Hx    ??? Urolithiasis Neg Hx       Family Status   Relation Name Status   ??? Brother  Alive   ??? Mother  Deceased   ??? Father  Deceased   ??? Sister  Alive   ??? Sister  Alive   ??? Sister  Alive   ??? Sister  Alive   ??? Sister  Alive   ??? Sister  Alive   ??? Brother  Deceased   ??? Brother  Deceased   ??? Brother  Deceased   ??? Son  Alive   ??? Neg Hx  (Not Specified)     Social History     Occupational History   ??? Not on file   Tobacco Use   ??? Smoking status: Current Every Day Smoker     Packs/day: 1.00     Years: 50.00     Pack years: 50.00     Types: Cigars   ??? Smokeless tobacco: Never Used   ??? Tobacco comment: 1 cigar a day   Substance and Sexual Activity   ??? Alcohol use: Yes     Alcohol/week: 11.4 oz     Types: 12 Cans of beer, 7 Shots of liquor per week   ??? Drug use: No   ??? Sexual activity: Not on file       No Known Allergies    Current Outpatient Medications   Medication Sig Dispense Refill   ??? albuterol (VENTOLIN HFA) 90 mcg/actuation inhaler Inhale.     ??? ascorbic acid (VITAMIN C) 1000 MG tablet Take 1,000 mg by mouth daily.     ??? aspirin (ECOTRIN) 81 MG tablet Take 81 mg by mouth. Frequency:QD   Dosage:81   MG  Instructions:  Note:Dose: 81MG      ??? cannabidiol, CBD, extract 100 mg/mL Soln Take by mouth daily at 10am.     ??? diaper,brief,adult,disposable Misc Use one diaper every 4 hours, sooner as needed     ??? enzalutamide (XTANDI) 40 mg capsule Take 4 capsules (160 mg total) by mouth daily. 120 capsule  2   ??? garlic 1 mg cap Take by mouth.     ??? glucosamine sulfate (GLUCOSAMINE) 500 mg Tab Take 1 tablet by mouth once daily.     ??? ibuprofen (ADVIL,MOTRIN) 800 MG tablet TAKE ONE TABLET BY MOUTH EVERY 8 HOURS AS NEEDED FOR PAIN 90 tablet 4   ??? omeprazole (PRILOSEC) 20 MG capsule Take 20 mg by mouth.     ??? saw palmetto 160 mg cap Take 160 mg by mouth.     ??? vitamin E 400 UNIT capsule Take 400 Units by mouth daily.     ??? zinc-pumpkin seed oil-saw palm 7.5-40-160 mg cap Take by mouth.     ??? ADVAIR DISKUS 250-50 mcg/dose diskus      ??? ginger, zingiber officinalis, (GINGER EXTRACT) 250 mg cap Take by mouth.     ??? multivitamin capsule Take 1 capsule by mouth daily.     ??? omega-3 fatty acids-fish oil 340-1,000 mg cap Take 1 capsule by mouth.       No current facility-administered medications for this visit.        The following portions of the patient's history were reviewed and updated as appropriate: allergies, current medications, past family history, past medical history, past social history, past surgical history and problem list.     Review of Systems   All other systems reviewed and are negative.        Vital Signs for this encounter:  BSA: 2.21 meters squared  BP 126/76  - Pulse 74  - Temp 37 ??C (98.6 ??F)  - Wt 100.7 kg (222 lb 1.6 oz)  - SpO2 95% - BMI 32.78 kg/m??     Physical Exam   Constitutional: He is oriented to person, place, and time. He appears well-developed and well-nourished. No distress.   HENT:   Head: Normocephalic.   Mouth/Throat: Oropharynx is clear and moist.   Eyes: Pupils are equal, round, and reactive to light. EOM are normal.   Neck: No JVD present. No thyromegaly present.   Cardiovascular: Normal rate, regular rhythm and normal heart sounds.   Pulmonary/Chest: Effort normal and breath sounds normal.   Abdominal: Bowel sounds are normal. He exhibits no distension and no mass. There is no tenderness.   Genitourinary: Rectum normal.   Genitourinary Comments: 12/12/17:  There was a rock hard nodule, about 1.5 cm - 2 cm in the prostate bed.   Musculoskeletal: He exhibits no edema.   Lymphadenopathy:   No adenopathy in the neck, supraclavicular, axillary or inguinal regions.   Neurological: He is alert and oriented to person, place, and time. No cranial nerve deficit. He exhibits normal muscle tone. Coordination normal.   Skin: No rash noted.   Psychiatric: He has a normal mood and affect. His behavior is normal.       Karnofsky/Lansky Performance Status  90,  Able to carry on normal activity; minor signs or symptoms of disease (ECOG equivalent 0)    Results:    WBC   Date Value Ref Range Status   12/12/2017 5.6 4.5 - 11.0 10*9/L Final     HGB   Date Value Ref Range Status   12/12/2017 13.4 (L) 13.5 - 17.5 g/dL Final     HCT   Date Value Ref Range Status   12/12/2017 40.7 (L) 41.0 - 53.0 % Final     Platelet   Date Value Ref Range Status   12/12/2017 236 150 - 440 10*9/L Final     Creatinine Whole  Blood, POC   Date Value Ref Range Status   09/01/2017 0.8 0.8 - 1.4 mg/dL Final     Creatinine   Date Value Ref Range Status   12/12/2017 0.88 0.70 - 1.30 mg/dL Final     AST   Date Value Ref Range Status   02/27/2018 27 19 - 55 U/L Final     PSA Diagnostic   Date Value Ref Range Status   11/25/2014 0.7 0.0 - 4.0 ng/mL Final   10/14/2014 11.3 (H) 0.0 - 4.0 ng/mL Final   07/11/2014 5.8 (H) 0.0 - 4.0 ng/mL Final   04/09/2014 9.9 (H) 0.0 - 4.0 ng/mL Final

## 2018-02-27 ENCOUNTER — Ambulatory Visit
Admit: 2018-02-27 | Discharge: 2018-02-28 | Payer: MEDICARE | Attending: Hematology & Oncology | Primary: Hematology & Oncology

## 2018-02-27 DIAGNOSIS — C61 Malignant neoplasm of prostate: Principal | ICD-10-CM

## 2018-02-27 DIAGNOSIS — R0683 Snoring: Secondary | ICD-10-CM

## 2018-02-27 DIAGNOSIS — J449 Chronic obstructive pulmonary disease, unspecified: Secondary | ICD-10-CM

## 2018-02-27 DIAGNOSIS — G4719 Other hypersomnia: Secondary | ICD-10-CM

## 2018-02-27 LAB — HEPATIC FUNCTION PANEL
ALKALINE PHOSPHATASE: 76 U/L (ref 38–126)
AST (SGOT): 27 U/L (ref 19–55)
BILIRUBIN DIRECT: <0.1 mg/dL (ref 0.00–0.40)
BILIRUBIN TOTAL: 0.6 mg/dL (ref 0.0–1.2)
PROTEIN TOTAL: 6.6 g/dL (ref 6.5–8.3)

## 2018-02-27 LAB — ALBUMIN: Albumin:MCnc:Pt:Ser/Plas:Qn:: 4

## 2018-02-27 LAB — PROSTATE SPECIFIC ANTIGEN: Prostate specific Ag:MCnc:Pt:Ser/Plas:Qn:: 197 — ABNORMAL HIGH

## 2018-02-27 NOTE — Unmapped (Signed)
Lupron 22.5mg  im in left hip per order.   Lupron injector clogged after half dose. Removed clog and injected the rest without pain or difficulty.  Lot number-[id number]  Pharmacist made aware of injector clogging.

## 2018-02-27 NOTE — Unmapped (Addendum)
--Please ask your dentist to give clearance to receive a medication called denosumab Brandon Olsen).  The printed information below regarding this drug.  --We will plan to have you back in 1 month for follow-up.  --In the meantime, if any questions or problems arise, do not hesitate to contact us.  The clinic phone number is [phone] or send a message using the email feature of My [facility] Chart.  --If you could further decrease your use of tobacco products, or quit completely, it would be a good thing.  You may want to consider a trial of nicotine skin patches.  --For your cough, consider trying the following: - Saline nasal spray 2 puffs each nostril, sniff and spit, hourly as needed (but at least 4-5 x daily).  There are lots of brands of saline available over the counter, but favor Simply Saline made by [employer], as I believe it has the best pump action.  Can make your own with 1 level teaspoon of salt in 1 cup of warm water.  Sometimes a pinch of baking soda helps.  If you make your own, have to make fresh at least daily.  You make your own, you need to make it daily.  In addition, it is usually easiest to try snorting the saline from the spoon.  If you think you may have allergies you may want to try the following:  - Nasalcrom spray- follow package instructions  - Steroid nasal spray (eg fluticasone, Flonase) if Nasalcrom is helping, but not completely.  - Consider use of an antihistamine eg Claritin or Zyrtec.      denosumab Brandon Olsen)  Pronunciation:  den OH sue mab  Brandon QuestRivka Olsen  What is the most important information I should know about Brandon Olsen?  This medication guide provides information about the Xgeva brand of denosumab. Prolia is another brand of denosumab used to treat osteoporosis in postmenopausal women who have high risk of bone fracture.  You should not receive denosumab if you have low levels of calcium in your blood (hypocalcemia).  Do not use if you are pregnant.  What is denosumab Brandon Olsen)? Denosumab is a monoclonal antibody. Monoclonal antibodies are made to target and destroy only certain cells in the body. This may help to protect healthy cells from damage.  The Xgeva brand of denosumab is used to prevent bone fractures and other skeletal conditions in people with tumors that have spread to the bone.  Brandon Olsen is also used to treat giant cell bone tumor in adults and teenagers with fully matured bone structure.  Brandon Olsen is also used to treat high blood levels of calcium caused by cancer, when other medicines such as pamidronate or zoledronic acid (Zometa) have been used without success.  This medication guide provides information about the Xgeva brand of denosumab. Prolia is another brand of denosumab used to treat osteoporosis in postmenopausal women who have high risk of bone fracture.  Denosumab may also be used for purposes not listed in this medication guide.  What should I discuss with my healthcare provider before receiving Xgeva?  You should not receive Xgeva if you are allergic to denosumab, or if you have low levels of calcium in your blood (hypocalcemia).  While you are receiving Xgeva, you should not use Prolia, another brand of denosumab.  Tell your doctor if you have kidney disease (or if you are on dialysis).  Denosumab may cause bone loss (osteonecrosis) in the jaw. Symptoms include jaw pain or numbness, red or swollen gums,  loose teeth, or slow healing after dental work.  Osteonecrosis of the jaw may be more likely if you have cancer or received chemotherapy, radiation, or steroids. Other risk factors include blood clotting disorders, anemia (low red blood cells), and a pre-existing dental problem.  Denosumab can harm an unborn baby.  You may need to have a negative pregnancy test before starting this treatment. Use effective birth control while using this medicine, and for at least 5 months after your last dose.  You should not breast-feed while using denosumab.  How is Brandon Olsen given? Denosumab is injected under the skin of your stomach, upper thigh, or upper arm. A healthcare provider will give you this injection.  Brandon Olsen is usually given once every 4 weeks.  Your doctor may have you take extra calcium and vitamin D while you are being treated with denosumab. Take only the amount of calcium and vitamin D that your doctor has prescribed.  If you need to have any dental work (especially surgery), tell the dentist ahead of time that you are receiving denosumab.  Pay special attention to your dental hygiene. Brush and floss your teeth regularly while receiving this medication. You may need to have a dental exam before you begin treatment with Xgeva. Follow your doctor's instructions.  What happens if I miss a dose?  Call your doctor for instructions if you miss an appointment for your Xgeva injection.  What happens if I overdose?  Seek emergency medical attention or call the Poison Help line at [phone].  What should I avoid while receiving Xgeva?  Follow your doctor's instructions about any restrictions on food, beverages, or activity.  What are the possible side effects of Xgeva?  Get emergency medical help if you have signs of an allergic reaction: itching, rash, hives; difficult breathing; feeling like you might pass out; swelling of your face, lips, tongue, or throat.  Call your doctor at once if you have:  ?? new or unusual pain in your thigh, hip, or groin;  ?? trouble breathing;  ?? low red blood cells (anemia) --pale skin, feeling light-headed or short of breath, rapid heart rate, trouble concentrating; o  ?? low levels of calcium in your blood (hypocalcemia) --numbness or tingly feeling around your mouth or in your fingers or toes, muscle tightness or contraction, overactive reflexes.  After you stop using Xgeva, tell your doctor if you have symptoms of high calcium levels (hypercalcemia) such as nausea, vomiting, headache, confusion, lack of energy, or tiredness.  Common side effects may include:  ?? feeling weak or tired;  ?? nausea, vomiting, loss of appetite;  ?? diarrhea, constipation;  ?? headache, back pain; or  ?? pain or swelling in your arms or legs.  This is not a complete list of side effects and others may occur. Call your doctor for medical advice about side effects. You may report side effects to FDA at 1-800-FDA-1088.  What other drugs will affect Xgeva?  Other drugs may interact with denosumab, including prescription and over-the-counter medicines, vitamins, and herbal products. Tell your doctor about all your current medicines and any medicine you start or stop using.  Where can I get more information?  Your doctor or pharmacist can provide more information about denosumab Brandon Olsen).  Remember, keep this and all other medicines out of the reach of children, never share your medicines with others, and use this medication only for the indication prescribed.  Every effort has been made to ensure that the information provided by [employer] Marland Kitchen  [employer]') is accurate, up-to-date, and complete, but no guarantee is made to that effect. Drug information contained herein may be time sensitive. [employer] information has been compiled for use by healthcare practitioners and consumers in the [place] and therefore [employer] does not warrant that uses outside of the [place] are appropriate, unless specifically indicated otherwise. [employer]'s drug information does not endorse drugs, diagnose patients or recommend therapy. [employer]'s drug information is an [employer], corporate to assist licensed healthcare practitioners in caring for their patients and/or to serve consumers viewing this service as a supplement to, and not a substitute for, the expertise, skill, knowledge and judgment of healthcare practitioners. The absence of a warning for a given drug or drug combination in no way should be construed to indicate that the drug or drug combination is safe, effective or appropriate for any given patient. [employer] does not assume any responsibility for any aspect of healthcare administered with the aid of information [employer] provides. The information contained herein is not intended to cover all possible uses, directions, precautions, warnings, drug interactions, allergic reactions, or adverse effects. If you have questions about the drugs you are taking, check with your doctor, nurse or pharmacist.  Copyright [phone] [employer] Version: 9.01. Revision date: 10/04/2016.  Care instructions adapted under license by [facility]. If you have questions about a medical condition or this instruction, always ask your healthcare professional. [employer] disclaims any warranty or liability for your use of this information.

## 2018-03-05 NOTE — Unmapped (Signed)
LVM for Brandon Olsen asking him to return my call. Attempting to discuss genetic test results.

## 2018-03-07 NOTE — Unmapped (Signed)
Left 2nd message for Mr. Westry.

## 2018-03-09 NOTE — Unmapped (Signed)
Received VM from Mr. Sirico. Called home and cell number, not available. VM on cell not set up, unable to LVM.

## 2018-03-12 NOTE — Unmapped (Signed)
[facility] Refill and Clinical Coordination Note  Medication(s): Xtandi 40mg     Brandon Olsen, DOB: August 19, 1946  Phone: [phone] (home) , Alternate phone contact: N/A  Shipping address: [address]  [place] [address]  Phone or address changes today?: No  All above HIPAA information verified.  Insurance changes? No    Completed refill and clinical call assessment today to schedule patient's medication shipment from the [facility] [phone]).      MEDICATION RECONCILIATION    Confirmed the medication and dosage are correct and have not changed: Yes, regimen is correct and unchanged.    Were there any changes to your medication(s) in the past month:  No, there are no changes reported at this time.    ADHERENCE    Is this medicine transplant or covered by Medicare Part B? No.    Did you miss any doses in the past 4 weeks? No missed doses reported.  Adherence counseling provided? Not needed     SIDE EFFECT MANAGEMENT    Are you tolerating your medication?:  Brandon Olsen reports tolerating the medication.  Side effect management discussed: None      Therapy is appropriate and should be continued.    Evidence of clinical benefit: See Epic note from 02/27/18      FINANCIAL/SHIPPING    Delivery Scheduled: Yes, Expected medication delivery date: 03/14/18   Additional medications refilled: No additional medications/refills needed at this time.    The patient will receive an FSI print out for each medication shipped and additional FDA Medication Guides as required.  Patient education from Brandon Olsen or Brandon Olsen may also be included in the shipment.    Brandon Olsen did not have any additional questions at this time.    Delivery address validated in FSI scheduling system: Yes, address listed above is correct.      We will follow up with patient monthly for standard refill processing and delivery.      Thank you,  Brandon Olsen   [facility] Specialty Pharmacist

## 2018-03-13 MED FILL — XTANDI/40MG/CAPS: XTANDI/40MG/CAPS | 30 days supply | Qty: 120 | Fill #1

## 2018-03-21 NOTE — Unmapped (Signed)
March 21, 2018    Brandon Olsen  [address]  [place] [address]      Dear Mr. Watchman,    It was a pleasure meeting you in the [facility] on February 16, 2018. We are sending this letter as a summary of our discussion and your genetic test results. Unfortunately, we have not been able to reach you by phone. If you have any questions or would like to discuss this by phone, please call [phone]. Your genetic test results were normal.    GENETIC TESTING: Most cancer is sporadic, meaning it happens by chance. Some prostate cancer is thought to be hereditary, or due to strong inherited risk factors. Families with features suggestive of hereditary risk tend to have multiple family members diagnosed with the same or related cancers (such as breast and ovarian cancer), diagnoses in multiple generations, diagnoses before age 65, and people diagnosed with two primary (new) cancers. Certain types of cancer, such as metastatic prostate cancer, are also somewhat more likely to be hereditary.    Some of the factors outlined above are found in your personal and family history. Specifically, you were diagnosed with metastatic prostate cancer. Your family history includes prostate cancer in your brother. For full details of your reported family history, please see the enclosed copy of your family tree. Please contact us if there are any inaccuracies so that we may correct them.    Based on your personal and family history, we recommended that you pursue testing of 12 genes associated with hereditary prostate cancer, including BRCA1 and BRCA2. For a full list of the genes that were analyzed, please see the enclosed copy of your test report.    Your test, which was performed at [facility], did not reveal a mutation in any of these genes. Since the current test is not perfect, it is possible there may be a mutation that current testing cannot detect, but that chance is small.      This normal result is reassuring suggests that your cancer was most likely not due to an inherited predisposition. Most cancers happen by chance and your negative test, along with details of your family history, suggests that your cancer falls into this category.    CANCER SCREENING: We recommend you continue to follow the cancer screening guidelines outlined by your primary healthcare providers.    FAMILY MEMBERS: Your immediate male relatives, including your son, may remain at slightly increased risk to develop prostate cancer based on the family history alone. We recommend they discuss the potential benefits and limitations of prostate cancer screening with their primary healthcare providers.    Again, it has been a pleasure working with you. Cancer genetics is a rapidly advancing field, and it is possible that new genetic tests could help guide your care in the future. We encourage you to remain in contact with [place] on an annual basis so we can update your personal and family histories and let you know of advances in cancer genetics that may benefit you and your family. If we can be of further assistance, please call us at [phone].    Sincerely,    Alric Ran, MS, CGC, Genetic Counselor    Brandon Kern, MD, PhD, Medical Geneticist      Enclosures: Genetic test results, family tree

## 2018-03-30 NOTE — Unmapped (Signed)
[facility] Refill Coordination Note    Specialty Medication(s) to be Shipped:   General Specialty: [name] 40MG     Other medication(s) to be shipped:       Brandon Olsen, DOB: [birth year]  Phone: [phone] (home)   Shipping Address: [address]  [place] [address]    All above HIPAA information was verified with patient's family member.     Completed refill call assessment today to schedule patient's medication shipment from the [facility] [phone]).       Specialty medication(s) and dose(s) confirmed: Regimen is correct and unchanged.   Changes to medications: Brandon Olsen reports no changes reported at this time.  Changes to insurance: No  Questions for the pharmacist: No    The patient will receive an FSI print out for each medication shipped and additional FDA Medication Guides as required.  Patient education from [place] or Robet Leu may also be included in the shipment.    DISEASE/MEDICATION-SPECIFIC INFORMATION        N/A    ADHERENCE              MEDICARE PART B DOCUMENTATION         SHIPPING     Shipping address confirmed in FSI.     Delivery Scheduled: Yes, Expected medication delivery date: [id number] [facility] via UPS or courier.     [name]   [facility] Specialty Technician

## 2018-04-02 ENCOUNTER — Ambulatory Visit
Admit: 2018-04-02 | Discharge: 2018-04-03 | Payer: MEDICARE | Attending: Hematology & Oncology | Primary: Hematology & Oncology

## 2018-04-02 DIAGNOSIS — R0683 Snoring: Secondary | ICD-10-CM

## 2018-04-02 DIAGNOSIS — J449 Chronic obstructive pulmonary disease, unspecified: Secondary | ICD-10-CM

## 2018-04-02 DIAGNOSIS — G4719 Other hypersomnia: Secondary | ICD-10-CM

## 2018-04-02 DIAGNOSIS — C61 Malignant neoplasm of prostate: Principal | ICD-10-CM

## 2018-04-02 LAB — CBC W/ AUTO DIFF
BASOPHILS ABSOLUTE COUNT: 0 10*9/L (ref 0.0–0.1)
EOSINOPHILS ABSOLUTE COUNT: 0.1 10*9/L (ref 0.0–0.4)
EOSINOPHILS RELATIVE PERCENT: 2.2 %
HEMATOCRIT: 38.3 % — ABNORMAL LOW (ref 41.0–53.0)
HEMOGLOBIN: 12.7 g/dL — ABNORMAL LOW (ref 13.5–17.5)
LARGE UNSTAINED CELLS: 2 % (ref 0–4)
LYMPHOCYTES ABSOLUTE COUNT: 1.4 10*9/L — ABNORMAL LOW (ref 1.5–5.0)
LYMPHOCYTES RELATIVE PERCENT: 31.1 %
MEAN CORPUSCULAR HEMOGLOBIN CONC: 33.3 g/dL (ref 31.0–37.0)
MEAN CORPUSCULAR HEMOGLOBIN: 31 pg (ref 26.0–34.0)
MEAN CORPUSCULAR VOLUME: 93.2 fL (ref 80.0–100.0)
MEAN PLATELET VOLUME: 7.4 fL (ref 7.0–10.0)
MONOCYTES ABSOLUTE COUNT: 0.2 10*9/L (ref 0.2–0.8)
NEUTROPHILS RELATIVE PERCENT: 60.5 %
PLATELET COUNT: 340 10*9/L (ref 150–440)
RED BLOOD CELL COUNT: 4.11 10*12/L — ABNORMAL LOW (ref 4.50–5.90)
RED CELL DISTRIBUTION WIDTH: 14.1 % (ref 12.0–15.0)
WBC ADJUSTED: 4.6 10*9/L (ref 4.5–11.0)

## 2018-04-02 LAB — HEMOGLOBIN: Lab: 12.7 — ABNORMAL LOW

## 2018-04-02 LAB — ALKALINE PHOSPHATASE: Alkaline phosphatase:CCnc:Pt:Ser/Plas:Qn:: 72

## 2018-04-02 LAB — PROSTATE SPECIFIC ANTIGEN: Prostate specific Ag:MCnc:Pt:Ser/Plas:Qn:: 98.7 — ABNORMAL HIGH

## 2018-04-02 NOTE — Unmapped (Signed)
Return Visit Note    Patient Name: Brandon Olsen  Patient Age: 72 y.o.  Encounter Date: 04/02/2018    Referring Physician:     Towanda Malkin, MD  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   -- The patient restarted Lupron 22.5 mg IM, and will continue on Xtandi; the next Lupron dose will be planned for September 16.  -- Discussed Xgeva as an adjunct to therapy; requested that he get dental clearance.  Would consider baseline DEXA scan when therapy initiated.  -- Discussed smoking cessation, the likely link between his loose cough, post nasal drainage and smoking; also discussed ways to treat drainage.        I have reviewed the laboratory, pathology, and radiology reports in detail and discussed findings with the patient    History of Present Illness:     Brandon Olsen is a 72 y.o. male who is seen in consultation at the request of Lonn Georgia* for an evaluation of Prostate Cancer.  Patient was diagnosed in 2007, with Gleason 7 (4+3) right face, right mid medial, right mid lateral and left mid lateral high-grade PIN; PSA 5.7 with 6% free PSA .  He was treated with cryoablation.  He was without evidence of metastatic disease.  He did well, but was noted to have elevated PSA at least as early as June 2014 (PSA 6.9).  He was followed without therapy until 10/14/2014, the PSA reached 11.6.  He was started on bicalutamide and Lupron, and has been on these agents since.  PSA was low until 10/26/2016, when it started to rise, first to 1.28, then on 08/11/2017 to 6.56, and on 11/07/17, 22.6 ng/mL. His last Lupron injection was a 7-month dose in 04/2017.   In December 2018, imaging showed no bony metastases but possible pelvic adenopathy and questionable nodules in the liver. In 12/2017, imaging with chest CT and Mabd/pelvic MRI showed pelvic adenopathy, and multiple bone mets.   The patient was referred to Dr Regino Schultze for consideration of involvement in a clinical trial, or Provenge.  He was not felt to be a candidate for Provenge, and eventually declined treatment on the study protocal.   Patient states that he still feels well without complaint.  He denies difficulty passing stool or urine, and maintains that he has a good appetite.  He continues to smoke at least 1 cigar a day, and has a loose cough.  States that his weight has been stable.  He has mild and infrequent hot flashes.      Oncology History:       Malignant neoplasm of prostate (CMS-HCC)    01/13/2006 Biopsy     Gleason's 7 ( 4  3) right face, right mid medial, right mid lateral in left mid lateral high-grade PIN.  PSA  5.7 with 6% free PSA       01/24/2006 -  Cancer Staged       Bone scan:  No evidence of metastatic disease      02/02/2006 -  Cancer Staged       CT AP:  Horseshoe kidney present      03/22/2006 Surgery     Cryoablation      12/02/2009 -  Cancer Staged       MRI Pelvis:  No definite abnormality noted within the prostate.   2. Possible rectal lesion along the anterior wall of the rectum. Direct   visualization of the rectum is suggested for further evaluation.       02/25/2010 -  Cancer Staged       PET scan:  Findings concerning for bony metastatic involvement in the anterior right   proximal femur and in the left side of the sacrum as described. Other   areas   are in regions of degenerative changes especially in the lumbar and   cervical spine areas.      09/01/2017 -  Cancer Staged       CT AP:  Bilateral prominent subcentimeter pelvic sidewall lymph nodes which are indeterminate but early metastatic disease cannot be excluded. Attention on follow-up.    Multiple hepatic hypodensities, these have CT appearance of simple cysts and biliary hamartomas. This could be further evaluated with MRI.    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    A 2.6 cm hypodense lesion is detected in the subcutaneous tissues of the right gluteal region. This lesion centrally demonstrates low attenuation close to water. Could be the sequela of fat necrosis or a small fluid collection secondary to injection. The lesion demonstrates no obvious rim enhancement or associated stranding. Correlation with clinical findings is recommended to exclude an abscess.  ??      09/01/2017 -  Cancer Staged     Bone scan: No evidence of osseous metastatic disease          12/12/2017 -  Cancer Staged       MRI AP:  --Irregular prostate mass with extracapsular extension consistent with primary malignancy.  --Metastatic bilateral iliac and lower para-aortic lymphadenopathy.  --Vertebral and pelvic bone metastases.  --Peripherally enhancing fluid collection in the lateral aspect of the right gluteus maximus is favored to be posttraumatic in nature. Abscess could have this appearance in appropriate clinical setting.      12/12/2017 -  Cancer Staged     LD Screening Chest CT:  New lytic lesions in the thoracic spine, compatible with metastases, as suggested on same-day MRI of the abdomen and pelvis.    -No pulmonary nodules identified.      02/16/2018 -  Cancer Staged     Prostate panel: No mutations identified. Tested genes: ATM, BRCA1, BRCA2, CHEK2, EPCAM*, MLH1, MSH2, MSH6, NBN, PMS2, TP53, HOXB13 (sequence change only)        02/27/2018 Endocrine/Hormone Therapy     OP LEUPROLIDE (22.5 MG EVERY 3 MONTHS)  Plan Provider: Towanda Malkin, MD         Past Medical History: Diagnosis Date   ??? Congenital vascular abnormality    ??? COPD (chronic obstructive pulmonary disease) (CMS-HCC)    ??? Impotence of organic origin    ??? Inguinal hernia without mention of obstruction or gangrene, unilateral or unspecified, (not specified as recurrent)    ??? Malignant neoplasm of prostate (CMS-HCC)     01/16/2006 Gleason's 7 (  4  3) right face, right mid medial, right mid lateral in left mid lateral high-grade PIN.  PSA 5.7 with 6% free PSA    ??? 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 Unspecified congenital anomaly of urinary system    ??? Unspecified hemorrhoids without mention of complication       Past Surgical History:   Procedure Laterality Date   ??? HEMORRHOID SURGERY     ??? PROSTATE CRYOABLATION      03/23/2006 left nerve sparing   ??? Seminal Vessicle Biopsy      12/22/2009        Family History   Problem Relation Age of Onset   ??? Prostate cancer Brother    ??? Heart disease Mother    ??? Diabetes Mother    ??? Diabetes Sister    ??? Hypertension Sister    ??? Heart disease Brother    ??? Sarcoidosis Son    ??? Kidney cancer Neg Hx    ??? GU problems Neg Hx    ??? Urolithiasis Neg Hx       Family Status   Relation Name Status   ??? Brother  Alive   ??? Mother  Deceased   ??? Father  Deceased   ??? Sister  Alive   ??? Sister  Alive   ??? Sister  Alive   ??? Sister  Alive   ??? Sister  Alive   ??? Sister  Alive   ??? Brother  Deceased   ??? Brother  Deceased   ??? Brother  Deceased   ??? Son  Alive   ??? Neg Hx  (Not Specified)     Social History     Occupational History   ??? Not on file   Tobacco Use   ??? Smoking status: Current Every Day Smoker     Packs/day: 1.00     Years: 50.00     Pack years: 50.00     Types: Cigars   ??? Smokeless tobacco: Never Used   ??? Tobacco comment: 1 cigar a day   Substance and Sexual Activity   ??? Alcohol use: Yes     Alcohol/week: 19.0 standard drinks     Types: 12 Cans of beer, 7 Shots of liquor per week   ??? Drug use: No   ??? Sexual activity: Not on file       No Known Allergies    Current Outpatient Medications   Medication Sig Dispense Refill   ??? ADVAIR DISKUS 250-50 mcg/dose diskus      ??? albuterol (VENTOLIN HFA) 90 mcg/actuation inhaler Inhale.     ??? ascorbic acid (VITAMIN C) 1000 MG tablet Take 1,000 mg by mouth daily.     ??? aspirin (ECOTRIN) 81 MG tablet Take 81 mg by mouth. Frequency:QD   Dosage:81   MG  Instructions:  Note:Dose: 81MG      ??? cannabidiol, CBD, extract 100 mg/mL Soln Take by mouth daily at 10am.     ??? enzalutamide (XTANDI) 40 mg capsule Take 4 capsules (160 mg total) by mouth daily. 120 capsule 2   ??? garlic 1 mg cap Take by mouth.     ??? ginger, zingiber officinalis, (GINGER EXTRACT) 250 mg cap Take by mouth.     ??? glucosamine sulfate (GLUCOSAMINE) 500 mg Tab Take 1 tablet by mouth once daily.     ??? ibuprofen (ADVIL,MOTRIN) 800 MG tablet TAKE ONE TABLET BY  MOUTH EVERY 8 HOURS AS NEEDED FOR PAIN 90 tablet 4   ??? multivitamin capsule Take 1 capsule by mouth daily.     ??? NON FORMULARY      ??? omega-3 fatty acids-fish oil 340-1,000 mg cap Take 1 capsule by mouth.     ??? omeprazole (PRILOSEC) 20 MG capsule Take 20 mg by mouth.     ??? saw palmetto 160 mg cap Take 160 mg by mouth.     ??? UNABLE TO FIND Med Name: Taking moringa 1,000 mg daily, Baobab po daily and Sour Sop leaves tea daily to fight cancer     ??? UNABLE TO FIND Med Name: black seed oil     ??? vitamin E 400 UNIT capsule Take 400 Units by mouth daily.     ??? zinc-pumpkin seed oil-saw palm 7.5-40-160 mg cap Take by mouth.     ??? cholecalciferol, vitamin D3, 1,000 unit tablet Take by mouth.     ??? cod liver oil Oil Take by mouth.     ??? diaper,brief,adult,disposable Misc Use one diaper every 4 hours, sooner as needed     ??? THYMOL-CHLOROPHYLLIN ORAL Take by mouth.       No current facility-administered medications for this visit.        The following portions of the patient's history were reviewed and updated as appropriate: allergies, current medications, past family history, past medical history, past social history, past surgical history and problem list.     Review of Systems   All other systems reviewed and are negative.        Vital Signs for this encounter:  BSA: 2.19 meters squared  BP 143/79  - Pulse 87  - Temp 36.6 ??C (97.9 ??F) (Temporal)  - Wt 98.6 kg (217 lb 4.8 oz)  - SpO2 100%  - BMI 32.07 kg/m??     Physical Exam   Constitutional: He is oriented to person, place, and time. He appears well-developed and well-nourished. No distress.   HENT:   Head: Normocephalic.   Mouth/Throat: Oropharynx is clear and moist.   Eyes: Pupils are equal, round, and reactive to light. EOM are normal.   Neck: No JVD present. No thyromegaly present.   Cardiovascular: Normal rate, regular rhythm and normal heart sounds.   Pulmonary/Chest: Effort normal and breath sounds normal.   Abdominal: Bowel sounds are normal. He exhibits no distension and no mass. There is no tenderness.   Genitourinary: Rectum normal.   Genitourinary Comments: 12/12/17:  There was a rock hard nodule, about 1.5 cm - 2 cm in the prostate bed.   Musculoskeletal: He exhibits no edema.   Lymphadenopathy:   No adenopathy in the neck, supraclavicular, axillary or inguinal regions.   Neurological: He is alert and oriented to person, place, and time. No cranial nerve deficit. He exhibits normal muscle tone. Coordination normal.   Skin: No rash noted.   Psychiatric: He has a normal mood and affect. His behavior is normal.       Karnofsky/Lansky Performance Status  90,  Able to carry on normal activity; minor signs or symptoms of disease (ECOG equivalent 0)    Results:    WBC   Date Value Ref Range Status   04/02/2018 4.6 4.5 - 11.0 10*9/L Final     HGB   Date Value Ref Range Status   04/02/2018 12.7 (L) 13.5 - 17.5 g/dL Final     HCT   Date Value Ref Range Status   04/02/2018 38.3 (L)  41.0 - 53.0 % Final     Platelet   Date Value Ref Range Status   04/02/2018 340 150 - 440 10*9/L Final     Creatinine Whole Blood, POC   Date Value Ref Range Status   09/01/2017 0.8 0.8 - 1.4 mg/dL Final     Creatinine   Date Value Ref Range Status   12/12/2017 0.88 0.70 - 1.30 mg/dL Final     AST   Date Value Ref Range Status   02/27/2018 27 19 - 55 U/L Final     PSA Diagnostic   Date Value Ref Range Status   11/25/2014 0.7 0.0 - 4.0 ng/mL Final   10/14/2014 11.3 (H) 0.0 - 4.0 ng/mL Final   07/11/2014 5.8 (H) 0.0 - 4.0 ng/mL Final   04/09/2014 9.9 (H) 0.0 - 4.0 ng/mL Final

## 2018-04-02 NOTE — Unmapped (Addendum)
--  For your cough, consider trying the following: - Saline nasal spray 2 puffs each nostril, sniff and spit, hourly as needed (but at least 4-5 x daily).  There are lots of brands of saline available over the counter, but favor Simply Saline made by [employer], as I believe it has the best pump action.  Can make your own with 1 level teaspoon of salt in 1 cup of warm water.  Sometimes a pinch of baking soda helps.  If you make your own, have to make fresh at least daily.  You make your own, you need to make it daily.  In addition, it is usually easiest to try snorting the saline from the spoon.  --Some patients benefit from using guaifenesin, and expectorant.  This is sold under many different names, and many different dosing.  The most advertised brand is Mucinex.  You want to use about 600 mg twice a day.  If you think you may have allergies you may want to try the following:  - Nasalcrom spray- follow package instructions  - Steroid nasal spray (eg fluticasone, Flonase) if Nasalcrom is helping, but not completely.  - Consider use of an antihistamine eg Claritin or Zyrtec.     --Please strongly consider cutting back or even stopping smoking.    --Remember to contact your dentist, and get his permission to receive a drug called Rivka Barbara (also known as denosumab).  This agent binds to the cells that tear down bone, and stop him from doing so, and make it difficult for the cancer to grow in your bone.  The problem with the drug is that it can inhibit the healing of the bones of the mouth if you have to have a tooth pulled or have loose teeth.    --At the present time, if today's PSA is doing well, we will plan to reevaluate your case when you return in September for the next dose of Lupron.  If the PSA is not stable or improved, we we will make arrangements to see you sooner.

## 2018-04-04 NOTE — Unmapped (Signed)
As directed by Dr. Claude Manges, I have contacted the patient and reviewed with his wife the following:    ?? The PSA from 04/02/2018 shows a significant downward decline of 98.7 ng/mL when compared to 197 ng/mL in June 2019.    ?? The patient is to continue with the current treatment plan outlined in Dr. Leota Jacobsen note from 04/02/2018 with regard to the enzalutamide and Lupron.    Otherwise, the patient's wife expressed verbal understanding and had no other questions or concerns at this time and is agreeable to our plan.  She is to call the clinic back sooner should she or the patient have any other issues.

## 2018-04-10 MED FILL — XTANDI/40MG/CAPS: XTANDI/40MG/CAPS | 30 days supply | Qty: 120 | Fill #2

## 2018-05-07 NOTE — Unmapped (Signed)
[facility] Refill Coordination Note    Specialty Medication(s) to be Shipped:   General Specialty: [name] 40MG     Other medication(s) to be shipped:       [name], DOB: [birth year]  Phone: [phone] (home)   Shipping Address: [address]  [place] [address]    All above HIPAA information was verified with patient.     Completed refill call assessment today to schedule patient's medication shipment from the [facility] [phone]).       Specialty medication(s) and dose(s) confirmed: Regimen is correct and unchanged.   Changes to medications: [name] reports no changes reported at this time.  Changes to insurance: No  Questions for the pharmacist: No    The patient will receive a drug information handout for each medication shipped and additional FDA Medication Guides as required.      DISEASE/MEDICATION-SPECIFIC INFORMATION        N/A    ADHERENCE              MEDICARE PART B DOCUMENTATION         SHIPPING     Shipping address confirmed in Epic.     Delivery Scheduled: Yes, Expected medication delivery date: [phone] via UPS or courier.     [name]   [facility] Specialty Technician

## 2018-05-23 MED ORDER — ENZALUTAMIDE 40 MG CAPSULE
ORAL | 2 refills | 0 days | Status: CP
Start: 2018-05-23 — End: 2018-08-08
  Filled 2018-05-23: qty 120, 30d supply, fill #0

## 2018-05-23 MED FILL — XTANDI 40 MG CAPSULE: 30 days supply | Qty: 120 | Fill #0 | Status: AC

## 2018-05-23 NOTE — Unmapped (Signed)
HAD TO FAX XTANDI FOR REFILLS,WE JUST GOT A NEW SCRIPT BACK [id number]-CHRISTINA SPOKE WITH PT WIFE AND SHE SAID OK FOR A DELIVERY FOR [phone]

## 2018-05-27 NOTE — Unmapped (Signed)
Return Visit Note    Patient Name: Brandon Olsen  Patient Age: 72 y.o.  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   -- The patient restarted Lupron 22.5 mg IM, and will continue on Xtandi; the next Lupron dose will be planned for December 9  -- Discussed Xgeva as an adjunct to therapy; requested that he get dental clearance.  Would consider baseline DEXA scan when therapy initiated.  -- Discussed smoking cessation, the likely link between his loose cough, post nasal drainage and smoking; also discussed ways to treat drainage.  --Trial of tamsulosin 0.4 mg at bedtime for urinary leakage.        I have reviewed the laboratory, pathology, and radiology reports in detail and discussed findings with the patient    History of Present Illness:     Brandon Olsen is a 72 y.o. male who is seen in consultation at the request of Lonn Georgia* for an evaluation of Prostate Cancer.   Patient states that he still feels well without complaint.  He denies difficulty passing stool or urine, and maintains that he has a good appetite.  He continues to smoke at least 1 cigar a day, and has a loose cough.  States that his weight has been stable.  He has mild and infrequent hot flashes.  Is not yet been able to get a dental exam for clearance, but this is been scheduled with Dr. Deedra Ehrich on 05/30/2018 at 3:30.  He notes some urine leakage, that seems to be urge related.    Oncology History:  Patient was diagnosed in 2007, with Gleason 7 (4+3) right face, right mid medial, right mid lateral and left mid lateral high-grade PIN; PSA 5.7 with 6% free PSA .  He was treated with cryoablation.  He was without evidence of metastatic disease.  He did well, but was noted to have elevated PSA at least as early as June 2014 (PSA 6.9).  He was followed without therapy until 10/14/2014, the PSA reached 11.6.  He was started on bicalutamide and Lupron, and has been on these agents since.  PSA was low until 10/26/2016, when it started to rise, first to 1.28, then on 08/11/2017 to 6.56, and on 11/07/17, 22.6 ng/mL. His last Lupron injection was a 81-month dose in 04/2017.   In December 2018, imaging showed no bony metastases but possible pelvic adenopathy and questionable nodules in the liver. In 12/2017, imaging with chest CT and Mabd/pelvic MRI showed pelvic adenopathy, and multiple bone mets.   The patient was referred to Dr Regino Schultze for consideration of involvement in a clinical trial, or Provenge.  He was not felt to be a candidate for Provenge, and eventually declined  treatment on the study protocal.      Malignant neoplasm of prostate (CMS-HCC)    01/13/2006 Biopsy     Gleason's 7 ( 4  3) right face, right mid medial, right mid lateral in left mid lateral high-grade PIN.  PSA 5.7 with 6% free PSA       01/24/2006 -  Cancer Staged       Bone scan:  No evidence of metastatic disease      02/02/2006 -  Cancer Staged       CT AP:  Horseshoe kidney present      03/22/2006 Surgery     Cryoablation      12/02/2009 -  Cancer Staged       MRI Pelvis:  No definite abnormality noted within the prostate.   2. Possible rectal lesion along the anterior wall of the rectum. Direct   visualization of the rectum is suggested for further evaluation.       02/25/2010 -  Cancer Staged PET scan:  Findings concerning for bony metastatic involvement in the anterior right   proximal femur and in the left side of the sacrum as described. Other   areas   are in regions of degenerative changes especially in the lumbar and   cervical   spine areas.      09/01/2017 -  Cancer Staged       CT AP:  Bilateral prominent subcentimeter pelvic sidewall lymph nodes which are indeterminate but early metastatic disease cannot be excluded. Attention on follow-up.    Multiple hepatic hypodensities, these have CT appearance of simple cysts and biliary hamartomas. This could be further evaluated with MRI.    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    A 2.6 cm hypodense lesion is detected in the subcutaneous tissues of the right gluteal region. This lesion centrally demonstrates low attenuation close to water. Could be the sequela of fat necrosis or a small fluid collection secondary to injection. The lesion demonstrates no obvious rim enhancement or associated stranding. Correlation with clinical findings is recommended to exclude an abscess.  ??      09/01/2017 -  Cancer Staged     Bone scan: No evidence of osseous metastatic disease          12/12/2017 -  Cancer Staged       MRI AP:  --Irregular prostate mass with extracapsular extension consistent with primary malignancy.  --Metastatic bilateral iliac and lower para-aortic lymphadenopathy.  --Vertebral and pelvic bone metastases.  --Peripherally enhancing fluid collection in the lateral aspect of the right gluteus maximus is favored to be posttraumatic in nature. Abscess could have this appearance in appropriate clinical setting.      12/12/2017 -  Cancer Staged     LD Screening Chest CT:  New lytic lesions in the thoracic spine, compatible with metastases, as suggested on same-day MRI of the abdomen and pelvis.    -No pulmonary nodules identified.      02/16/2018 -  Cancer Staged     Prostate panel: No mutations identified. Tested genes: ATM, BRCA1, BRCA2, CHEK2, EPCAM*, MLH1, MSH2, MSH6, NBN, PMS2, TP53, HOXB13 (sequence change only)        02/27/2018 Endocrine/Hormone Therapy     OP LEUPROLIDE (22.5 MG EVERY 3 MONTHS)  Plan Provider: Towanda Malkin, MD         Past Medical History:   Diagnosis Date   ??? Congenital vascular abnormality    ??? COPD (chronic obstructive pulmonary disease) (  CMS-HCC)    ??? Impotence of organic origin    ??? Inguinal hernia without mention of obstruction or gangrene, unilateral or unspecified, (not specified as recurrent)    ??? Malignant neoplasm of prostate (CMS-HCC)     01/16/2006 Gleason's 7 ( 4  3) right face, right mid medial, right mid lateral in left mid lateral high-grade PIN.  PSA 5.7 with 6% free PSA    ??? 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 Unspecified congenital anomaly of urinary system    ??? Unspecified hemorrhoids without mention of complication       Past Surgical History:   Procedure Laterality Date   ??? HEMORRHOID SURGERY     ??? PROSTATE CRYOABLATION      03/23/2006 left nerve sparing   ??? Seminal Vessicle Biopsy      12/22/2009        Family History   Problem Relation Age of Onset   ??? Prostate cancer Brother    ??? Heart disease Mother    ??? Diabetes Mother    ??? Diabetes Sister    ??? Hypertension Sister    ??? Heart disease Brother    ??? Sarcoidosis Son    ??? Kidney cancer Neg Hx    ??? GU problems Neg Hx    ??? Urolithiasis Neg Hx       Family Status   Relation Name Status   ??? Brother  Alive   ??? Mother  Deceased   ??? Father  Deceased   ??? Sister  Alive   ??? Sister  Alive   ??? Sister  Alive   ??? Sister  Alive   ??? Sister  Alive   ??? Sister  Alive   ??? Brother  Deceased   ??? Brother  Deceased   ??? Brother  Deceased   ??? Son  Alive   ??? Neg Hx  (Not Specified)     Social History     Occupational History   ??? Not on file   Tobacco Use   ??? Smoking status: Current Every Day Smoker     Packs/day: 1.00     Years: 50.00     Pack years: 50.00     Types: Cigars   ??? Smokeless tobacco: Never Used   ??? Tobacco comment: 1 cigar a day   Substance and Sexual Activity   ??? Alcohol use: Yes     Alcohol/week: 19.0 standard drinks     Types: 12 Cans of beer, 7 Shots of liquor per week   ??? Drug use: No   ??? Sexual activity: Not on file       No Known Allergies    Current Outpatient Medications   Medication Sig Dispense Refill   ??? albuterol (VENTOLIN HFA) 90 mcg/actuation inhaler Inhale.     ??? ascorbic acid (VITAMIN C) 1000 MG tablet Take 1,000 mg by mouth daily.     ??? aspirin (ECOTRIN) 81 MG tablet Take 81 mg by mouth. Frequency:QD   Dosage:81   MG  Instructions:  Note:Dose: 81MG      ??? cannabidiol, CBD, extract 100 mg/mL Soln Take by mouth daily at 10am.     ??? cholecalciferol, vitamin D3, 1,000 unit tablet Take by mouth.     ??? cod liver oil Oil Take by mouth.     ??? diaper,brief,adult,disposable Misc Use one diaper every 4 hours, sooner as needed     ???  enzalutamide (XTANDI) 40 mg capsule TAKE 4 CAPSULES BY MOUTH ONCE DAILY 120 each 2   ??? garlic 1 mg cap Take by mouth.     ??? ginger, zingiber officinalis, (GINGER EXTRACT) 250 mg cap Take by mouth.     ??? glucosamine sulfate (GLUCOSAMINE) 500 mg Tab Take 1 tablet by mouth once daily.     ??? ibuprofen (ADVIL,MOTRIN) 800 MG tablet TAKE ONE TABLET BY MOUTH EVERY 8 HOURS AS NEEDED FOR PAIN 90 tablet 4   ??? multivitamin capsule Take 1 capsule by mouth daily.     ??? omeprazole (PRILOSEC) 20 MG capsule Take 20 mg by mouth.     ??? saw palmetto 160 mg cap Take 160 mg by mouth.     ??? THYMOL-CHLOROPHYLLIN ORAL Take by mouth.     ??? UNABLE TO FIND Med Name: Taking moringa 1,000 mg daily, Baobab po daily and Sour Sop leaves tea daily to fight cancer     ??? UNABLE TO FIND Med Name: black seed oil     ??? vitamin E 400 UNIT capsule Take 400 Units by mouth daily.     ??? zinc-pumpkin seed oil-saw palm 7.5-40-160 mg cap Take by mouth.     ??? ADVAIR DISKUS 250-50 mcg/dose diskus      ??? NON FORMULARY      ??? omega-3 fatty acids-fish oil 340-1,000 mg cap Take 1 capsule by mouth.     ??? tamsulosin (FLOMAX) 0.4 mg capsule Take 1 capsule (0.4 mg total) by mouth daily. 90 capsule 3     Current Facility-Administered Medications   Medication Dose Route Frequency Provider Last Rate Last Dose   ??? denosumab (XGEVA) injection 120 mg  120 mg Subcutaneous Once Elna Breslow Crandell, CPP           The following portions of the patient's history were reviewed and updated as appropriate: allergies, current medications, past family history, past medical history, past social history, past surgical history and problem list.     Review of Systems   All other systems reviewed and are negative.        Vital Signs for this encounter:  BSA: 1.94 meters squared  BP 128/76  - Pulse 74  - Temp 36.3 ??C (97.3 ??F) (Temporal)  - Ht 175.3 cm (5' 9)  - Wt 77.2 kg (170 lb 1.6 oz)  - SpO2 97%  - BMI 25.12 kg/m??     Physical Exam   Constitutional: He is oriented to person, place, and time. He appears well-developed and well-nourished. No distress.   HENT:   Head: Normocephalic.   Mouth/Throat: Oropharynx is clear and moist.   Eyes: Pupils are equal, round, and reactive to light. EOM are normal.   Neck: No JVD present. No thyromegaly present.   Cardiovascular: Normal rate, regular rhythm and normal heart sounds.   Pulmonary/Chest: Effort normal and breath sounds normal.   Abdominal: Bowel sounds are normal. He exhibits no distension and no mass. There is no tenderness.   Genitourinary: Rectum normal.   Genitourinary Comments: 12/12/17:  There was a rock hard nodule, about 1.5 cm - 2 cm in the prostate bed.   Musculoskeletal: He exhibits no edema.   Lymphadenopathy:   No adenopathy in the neck, supraclavicular, axillary or inguinal regions.   Neurological: He is alert and oriented to person, place, and time. No cranial nerve deficit. He exhibits normal muscle tone. Coordination normal.   Skin: No rash noted.   Psychiatric: He has a normal mood  and affect. His behavior is normal.       Karnofsky/Lansky Performance Status  90, Able to carry on normal activity; minor signs or symptoms of disease (ECOG equivalent 0)    Results:    WBC   Date Value Ref Range Status   04/02/2018 4.6 4.5 - 11.0 10*9/L Final     HGB   Date Value Ref Range Status   04/02/2018 12.7 (L) 13.5 - 17.5 g/dL Final     HCT   Date Value Ref Range Status   04/02/2018 38.3 (L) 41.0 - 53.0 % Final     Platelet   Date Value Ref Range Status   04/02/2018 340 150 - 440 10*9/L Final     Creatinine Whole Blood, POC   Date Value Ref Range Status   09/01/2017 0.8 0.8 - 1.4 mg/dL Final     Creatinine   Date Value Ref Range Status   05/28/2018 0.87 0.70 - 1.30 mg/dL Final   09/60/4540 9.81 0.70 - 1.30 mg/dL Final     AST   Date Value Ref Range Status   05/28/2018 27 19 - 55 U/L Final     PSA Diagnostic   Date Value Ref Range Status   11/25/2014 0.7 0.0 - 4.0 ng/mL Final   10/14/2014 11.3 (H) 0.0 - 4.0 ng/mL Final   07/11/2014 5.8 (H) 0.0 - 4.0 ng/mL Final   04/09/2014 9.9 (H) 0.0 - 4.0 ng/mL Final

## 2018-05-28 ENCOUNTER — Ambulatory Visit
Admit: 2018-05-28 | Discharge: 2018-05-29 | Payer: MEDICARE | Attending: Hematology & Oncology | Primary: Hematology & Oncology

## 2018-05-28 ENCOUNTER — Institutional Professional Consult (permissible substitution): Admit: 2018-05-28 | Discharge: 2018-05-29 | Payer: MEDICARE

## 2018-05-28 DIAGNOSIS — C7951 Secondary malignant neoplasm of bone: Principal | ICD-10-CM

## 2018-05-28 DIAGNOSIS — C61 Malignant neoplasm of prostate: Principal | ICD-10-CM

## 2018-05-28 DIAGNOSIS — N3941 Urge incontinence: Secondary | ICD-10-CM

## 2018-05-28 LAB — CREATININE
CREATININE: 0.87 mg/dL (ref 0.70–1.30)
CREATININE: 0.87 mg/dL (ref 0.70–1.30)
Creatinine:MCnc:Pt:Ser/Plas:Qn:: 0.87
EGFR CKD-EPI AA MALE: >90 mL/min/{1.73_m2} (ref >=60)
EGFR CKD-EPI NON-AA MALE: 86 mL/min/{1.73_m2} (ref >=60)

## 2018-05-28 LAB — HEPATIC FUNCTION PANEL
ALKALINE PHOSPHATASE: 90 U/L (ref 38–126)
ALT (SGPT): 27 U/L (ref 19–72)
BILIRUBIN DIRECT: 0.1 mg/dL (ref 0.00–0.40)
BILIRUBIN TOTAL: 0.6 mg/dL (ref 0.0–1.2)
PROTEIN TOTAL: 7 g/dL (ref 6.5–8.3)

## 2018-05-28 LAB — EGFR CKD-EPI NON-AA MALE: Lab: 86

## 2018-05-28 LAB — CALCIUM
CALCIUM: 10.4 mg/dL — ABNORMAL HIGH (ref 8.5–10.2)
Calcium:MCnc:Pt:Ser/Plas:Qn:: 10.4 — ABNORMAL HIGH

## 2018-05-28 LAB — ALBUMIN: Albumin:MCnc:Pt:Ser/Plas:Qn:: 4.4

## 2018-05-28 LAB — BILIRUBIN TOTAL: Bilirubin:MCnc:Pt:Ser/Plas:Qn:: 0.6

## 2018-05-28 LAB — PHOSPHORUS: Phosphate:MCnc:Pt:Ser/Plas:Qn:: 4.2

## 2018-05-28 LAB — PROSTATE SPECIFIC ANTIGEN: Prostate specific Ag:MCnc:Pt:Ser/Plas:Qn:: 33.4 — ABNORMAL HIGH

## 2018-05-28 MED ORDER — TAMSULOSIN 0.4 MG CAPSULE
ORAL_CAPSULE | Freq: Every day | ORAL | 3 refills | 0.00000 days | Status: CP
Start: 2018-05-28 — End: 2019-05-28

## 2018-05-28 NOTE — Unmapped (Addendum)
--Try taking tamsulosin (Flomax) 0.4 mg at bedtime, to see if this improves your urine leakage.  Let me know if it is not helping after 1 to 2 weeks of treatment.  See the information below for more details about this drug.  --Return to your dentist on Wednesday at 3:30 p.m. for evaluation, and clearance to receive the agent Xgeva.  --I will contact you if today's lab tests are not acceptable.  Otherwise we will plan to see you back on August 20, 2018 for your next injected therapy.  Remain on Xtandi (160 mg, 4 capsules) until then.  Patient Education        tamsulosin  Pronunciation:  tam soo LOE sin  Brand:  Flomax  What is the most important information I should know about tamsulosin?  Follow all directions on your medicine label and package. Tell each of your healthcare providers about all your medical conditions, allergies, and all medicines you use.  What is tamsulosin?  Tamsulosin is an alpha-blocker that is used to improve urination in men with benign prostatic hyperplasia (enlarged prostate).  Tamsulosin is not approved for use in women or children.  Tamsulosin may also be used for purposes not listed in this medication guide.  What should I discuss with my healthcare provider before taking tamsulosin?  You should not use tamsulosin if you are allergic to it.  Tell your doctor if you have ever had:  ?? liver or kidney disease;  ?? prostate cancer;  ?? low blood pressure; or  ?? an allergy to sulfa drugs.  Tamsulosin can affect your pupils. If you have cataract surgery, tell your surgeon ahead of time that you use this medicine.  Tamsulosin is not for use in women, and the effects of this medicine during pregnancy or in breastfeeding women are unknown.  How should I take tamsulosin?  Your doctor may test your prostate specific antigen (PSA) to check for prostate cancer before you take tamsulosin.  Follow all directions on your prescription label and read all medication guides or instruction sheets. Your doctor may occasionally change your dose. Use the medicine exactly as directed.  Tamsulosin is usually taken once a day, approximately 30 minutes after the same meal each day.  Swallow the capsule whole and do not crush, chew, break, or open it.  Your blood pressure will need to be checked often.  Some things can cause your blood pressure to get too low. This includes vomiting, diarrhea, or heavy sweating. Call your doctor if you are sick with vomiting or diarrhea.  Store at room temperature away from moisture and heat.  If you stop taking tamsulosin for any reason, call your doctor before you start taking it again. You may need a dose adjustment.  What happens if I miss a dose?  Take the medicine as soon as you can, but skip the missed dose if it is almost time for your next dose. Do not take two doses at one time.  If you miss your doses for several days in a row, talk with your doctor before restarting the medication.  What happens if I overdose?  Seek emergency medical attention or call the Poison Help line at [phone].  What should I avoid while taking tamsulosin?  Avoid driving or hazardous activity until you know how this medicine will affect you. Your reactions could be impaired. Avoid getting up too fast from a sitting or lying position, or you may feel dizzy.  What are the possible side effects of  tamsulosin?  Get emergency medical help if you have signs of an allergic reaction (hives, difficult breathing, swelling in your face or throat) or a severe skin reaction (fever, sore throat, burning eyes, skin pain, red or purple skin rash with blistering and peeling).  Stop using tamsulosin and call your doctor at once if you have:  ?? a light-headed feeling, like you might pass out; or  ?? penis erection that is painful or lasts 4 hours or longer.  Tamsulosin lowers blood pressure and may cause dizziness or fainting, especially when you first start taking it. You may feel very dizzy when you first wake up. Avoid getting up too fast from a sitting or lying position, or you may feel dizzy.  Common side effects may include:  ?? abnormal ejaculation, decreased amount of semen;  ?? dizziness, drowsiness, weakness;  ?? runny nose, cough;  ?? back pain, chest pain;  ?? nausea, diarrhea;  ?? tooth problems;  ?? blurred vision;  ?? sleep problems (insomnia); or  ?? decreased interest in sex.  This is not a complete list of side effects and others may occur. Call your doctor for medical advice about side effects. You may report side effects to FDA at 1-800-FDA-1088.  What other drugs will affect tamsulosin?  Tell your doctor about all your current medicines. Many drugs can increase your risk of very low blood pressure while taking tamsulosin, especially:  ?? medicines similar to tamsulosin (alfuzosin, doxazosin, prazosin, silodosin, or terazosin);  ?? heart or blood pressure medication; or  ?? sildenafil (Viagra) and other erectile dysfunction medicines.  This list is not complete and many other drugs may affect tamsulosin. This includes prescription and over-the-counter medicines, vitamins, and herbal products. Not all possible drug interactions are listed here.  Where can I get more information?  Your pharmacist can provide more information about tamsulosin.  Remember, keep this and all other medicines out of the reach of children, never share your medicines with others, and use this medication only for the indication prescribed.  Every effort has been made to ensure that the information provided by [employer] ('[employer]') is accurate, up-to-date, and complete, but no guarantee is made to that effect. Drug information contained herein may be time sensitive. [employer] information has been compiled for use by healthcare practitioners and consumers in the [place] and therefore [employer] does not warrant that uses outside of the [place] are appropriate, unless specifically indicated otherwise. [employer]'s drug information does not endorse drugs, diagnose patients or recommend therapy. [employer]'s drug information is an [employer], corporate to assist licensed healthcare practitioners in caring for their patients and/or to serve consumers viewing this service as a supplement to, and not a substitute for, the expertise, skill, knowledge and judgment of healthcare practitioners. The absence of a warning for a given drug or drug combination in no way should be construed to indicate that the drug or drug combination is safe, effective or appropriate for any given patient. [employer] does not assume any responsibility for any aspect of healthcare administered with the aid of information [employer] provides. The information contained herein is not intended to cover all possible uses, directions, precautions, warnings, drug interactions, allergic reactions, or adverse effects. If you have questions about the drugs you are taking, check with your doctor, nurse or pharmacist.  Copyright [phone] [employer] Version: 9.02. Revision date: 01/25/2018.  Care instructions adapted under license by [facility]. If you have questions about a medical condition or this instruction,  always ask your healthcare professional. [employer] disclaims any warranty or liability for your use of this information.

## 2018-05-28 NOTE — Unmapped (Signed)
Pt has not gotten clearance from [facility] for xgeva injection.     Lupron given im in right buttock.    Flu Vaccine given IM without issues. Tol Well. VIS given to pt.

## 2018-05-29 NOTE — Unmapped (Signed)
As directed by Dr. Claude Manges, I have contacted the patient and spoken with his wife regarding the following:    ?? The patient's PSA from 05/28/2018 resulted at 33.4 ng/mL and continues to show a noticeable downward trend from its high of 197 ng/mL on 02/27/2018.    ?? It was reiterated that the patient is to continue with the prescribed treatment plan as reviewed yesterday in clinic with Dr. Claude Manges.    ?? The next follow-up visit with Dr. Claude Manges is tentatively set for 08/28/2018 be in conjunction with the next scheduled dose of leuprolide.    Otherwise, the patient's wife expressed verbal understanding, and had no other questions or concerns at this time and is agreeable to the plan outlined above.  She or her husband are to call the clinic back sooner should there be any other issues.     No other actions taken and Dr. Claude Manges is aware.

## 2018-05-29 NOTE — Unmapped (Signed)
Lupron given in clinic by Consuella Lose.    Xgeva held - pt does not dental clearance yet.

## 2018-06-12 NOTE — Unmapped (Signed)
[facility] Refill Coordination Note  Specialty Medication(s): [name] 40mg   Additional Medications shipped:      Diaz Crago, DOB: March 13, 1946  Phone: [phone] (home) , Alternate phone contact: N/A  Phone or address changes today?: No  All above HIPAA information was verified with patient's family member.  Shipping Address: [address]  [place] [address]   Insurance changes? No    Completed refill call assessment today to schedule patient's medication shipment from the [facility] [phone]).      Confirmed the medication and dosage are correct and have not changed: Yes, regimen is correct and unchanged.    Confirmed patient started or stopped the following medications in the past month:  No, there are no changes reported at this time.    Are you tolerating your medication?:  Whalen reports tolerating the medication.    ADHERENCE    (Below is required for Medicare Part B or Transplant patients only - per drug):   How many tablets were dispensed last month: 120 caps  Patient currently has 40 caps remaining.    Did you miss any doses in the past 4 weeks? No missed doses reported.    FINANCIAL/SHIPPING    Delivery Scheduled: Yes, Expected medication delivery date: 101019 wfd nd     The patient will receive a drug information handout for each medication shipped and additional FDA Medication Guides as required.      Bubber did not have any additional questions at this time.    Delivery address validated in Epic.    We will follow up with patient monthly for standard refill processing and delivery.      Thank you,  [name]   [facility] Specialty Technician

## 2018-06-20 MED FILL — XTANDI 40 MG CAPSULE: 30 days supply | Qty: 120 | Fill #1 | Status: AC

## 2018-06-20 MED FILL — XTANDI 40 MG CAPSULE: ORAL | 30 days supply | Qty: 120 | Fill #1

## 2018-07-11 NOTE — Unmapped (Signed)
[facility] Refill Coordination Note  Specialty Medication(s): [name] 40mg   Additional Medications shipped:      Saafir Abdullah, DOB: 04-26-1946  Phone: [phone] (home) , Alternate phone contact: N/A  Phone or address changes today?: No  All above HIPAA information was verified with [facility] Address: [address]  [place] [address]   Insurance changes? No    Completed refill call assessment today to schedule patient's medication shipment from the [facility] [phone]).      Confirmed the medication and dosage are correct and have not changed: Yes, regimen is correct and unchanged.    Confirmed patient started or stopped the following medications in the past month:  No, there are no changes reported at this time.    Are you tolerating your medication?:  Deavon reports tolerating the medication.    ADHERENCE    (Below is required for Medicare Part B or Transplant patients only - per drug): Xtandi 40mg   How many tablets were dispensed last month: 120 capsules  Patient currently has 40 caps remaining.    Did you miss any doses in the past 4 weeks? No missed doses reported.    FINANCIAL/SHIPPING    Delivery Scheduled: Yes, Expected medication delivery date: [id number]     Medication will be delivered via Next Day Courier to the home address in [facility].    The patient will receive a drug information handout for each medication shipped and additional FDA Medication Guides as required.      Roben did not have any additional questions at this time.    We will follow up with patient monthly for standard refill processing and delivery.      Thank you,  [name]   [facility] Specialty Technician

## 2018-07-19 MED FILL — XTANDI 40 MG CAPSULE: 30 days supply | Qty: 120 | Fill #2 | Status: AC

## 2018-07-19 MED FILL — XTANDI 40 MG CAPSULE: ORAL | 30 days supply | Qty: 120 | Fill #2

## 2018-08-07 NOTE — Unmapped (Addendum)
[facility] Refill and Clinical Coordination Note  Medication(s): Xtandi 40 mg    [name], DOB: April 19, 1946  Phone: [phone] (home) , Alternate phone contact: N/A  Shipping address: [address]  [place] [address]  Phone or address changes today?: No  All above HIPAA information verified.  Insurance changes? No    Completed refill and clinical call assessment today to schedule patient's medication shipment from the [facility] [phone]).      MEDICATION RECONCILIATION    Confirmed the medication and dosage are correct and have not changed: Yes, regimen is correct and unchanged.    Were there any changes to your medication(s) in the past month:  No, there are no changes reported at this time.    ADHERENCE    Is this medicine transplant or covered by Medicare Part B? No.    Did you miss any doses in the past 4 weeks? Yes.  Lamark reports missing 1 to 2 days of medication therapy in the last 4 weeks.  Ashar reports forgetting to take medication as the cause of their non-adherance.  Adherence counseling provided? Not needed     SIDE EFFECT MANAGEMENT    Are you tolerating your medication?:  Taniela reports tolerating the medication.  Side effect management discussed: None      Therapy is appropriate and should be continued.    Evidence of clinical benefit: See Epic note from 05/28/18      FINANCIAL/SHIPPING    Delivery Scheduled: Yes, Expected medication delivery date: 08/15/18     Medication will be delivered via Next Day Courier to the home address in [place].    Additional medications refilled: No additional medications/refills needed at this time.    The patient will receive a drug information handout for each medication shipped and additional FDA Medication Guides as required.      June did not have any additional questions at this time.    Delivery address confirmed in Epic.     We will follow up with patient monthly for standard refill processing and delivery. Thank you,  Verdia Kuba   [facility] Specialty Pharmacist

## 2018-08-08 MED ORDER — ENZALUTAMIDE 40 MG CAPSULE
ORAL | 2 refills | 0 days | Status: CP
Start: 2018-08-08 — End: 2019-08-08
  Filled 2018-08-14: qty 120, 30d supply, fill #0

## 2018-08-14 MED FILL — XTANDI 40 MG CAPSULE: 30 days supply | Qty: 120 | Fill #0 | Status: AC

## 2018-08-27 NOTE — Unmapped (Signed)
Return Visit Note    Patient Name: Brandon Olsen  Patient Age: 72 y.o.  Encounter Date: 08/28/2018    Referring Physician:     Towanda Malkin, MD  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   -- The patient restarted Lupron 22.5 mg IM, and will continue on Xtandi; the next Lupron dose will be planned for 11/26/2018.  -- Discussed Xgeva as an adjunct to therapy; awaiting dental clearance.  Would consider baseline DEXA scan when therapy initiated.  -- Discussed smoking cessation.   In pre-contemplative phase of cessation.  -- Continue tamsulosin 0.4 mg at bedtime for urinary leakage, but will stop in the next month if not helpful.  He has added saw palmetto capsules, so far without improvement.        I have reviewed the laboratory, pathology, and radiology reports in detail and discussed findings with the patient    History of Present Illness:     Brandon Olsen is a 72 y.o. male who is seen in consultation at the request of Lonn Georgia* for an evaluation of Prostate Cancer.   The patient has aches in his joints that come and go.  He had aches in his shoulders last month, but this is cleared.  He is now having aches in his posterior pelvic region, which move around.  He has had similar discomfort in the past, and has had it resolved spontaneously.  He has been taking tamsulosin, but has not noted any improvement in his urinary leakage.  He is planning to try to refill it at least one more time before making a final decision on whether to stay on this medication or not.  He is smoking at least 1 cigar every 2 days.  His cough is unchanged.  Appetite is excellent.  He has not been able to get dental clearance yet, but has 1 more appointment with his dentist coming up.        Oncology History:  Patient was diagnosed in 2007, with Gleason 7 (4+3) right face, right mid medial, right mid lateral and left mid lateral high-grade PIN; PSA 5.7 with 6% free PSA .  He was treated with cryoablation.  He was without evidence of metastatic disease.  He did well, but was noted to have elevated PSA at least as early as June 2014 (PSA 6.9).  He was followed without therapy until 10/14/2014, the PSA reached 11.6.  He was started on bicalutamide and Lupron, and has been on these agents since.  PSA was low until 10/26/2016, when it started to rise, first to 1.28, then on 08/11/2017 to 6.56, and on 11/07/17, 22.6 ng/mL. His last Lupron injection was a 52-month dose in 04/2017.   In December 2018, imaging showed no bony metastases but possible pelvic adenopathy and questionable  nodules in the liver. In 12/2017, imaging with chest CT and Mabd/pelvic MRI showed pelvic adenopathy, and multiple bone mets.   The patient was referred to Dr Regino Schultze for consideration of involvement in a clinical trial, or Provenge.  He was not felt to be a candidate for Provenge, and eventually declined treatment on the study protocal.      Malignant neoplasm of prostate (CMS-HCC)    01/13/2006 Biopsy     Gleason's 7 ( 4  3) right face, right mid medial, right mid lateral in left mid lateral high-grade PIN.  PSA 5.7 with 6% free PSA       01/24/2006 -  Cancer Staged       Bone scan:  No evidence of metastatic disease      02/02/2006 -  Cancer Staged       CT AP:  Horseshoe kidney present      03/22/2006 Surgery     Cryoablation      12/02/2009 -  Cancer Staged MRI Pelvis:  No definite abnormality noted within the prostate.   2. Possible rectal lesion along the anterior wall of the rectum. Direct   visualization of the rectum is suggested for further evaluation.       02/25/2010 -  Cancer Staged       PET scan:  Findings concerning for bony metastatic involvement in the anterior right   proximal femur and in the left side of the sacrum as described. Other   areas   are in regions of degenerative changes especially in the lumbar and   cervical   spine areas.      09/01/2017 -  Cancer Staged       CT AP:  Bilateral prominent subcentimeter pelvic sidewall lymph nodes which are indeterminate but early metastatic disease cannot be excluded. Attention on follow-up.    Multiple hepatic hypodensities, these have CT appearance of simple cysts and biliary hamartomas. This could be further evaluated with MRI.    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    A 2.6 cm hypodense lesion is detected in the subcutaneous tissues of the right gluteal region. This lesion centrally demonstrates low attenuation close to water. Could be the sequela of fat necrosis or a small fluid collection secondary to injection. The lesion demonstrates no obvious rim enhancement or associated stranding. Correlation with clinical findings is recommended to exclude an abscess.  ??      09/01/2017 -  Cancer Staged     Bone scan: No evidence of osseous metastatic disease          12/12/2017 -  Cancer Staged       MRI AP:  --Irregular prostate mass with extracapsular extension consistent with primary malignancy.  --Metastatic bilateral iliac and lower para-aortic lymphadenopathy.  --Vertebral and pelvic bone metastases.  --Peripherally enhancing fluid collection in the lateral aspect of the right gluteus maximus is favored to be posttraumatic in nature. Abscess could have this appearance in appropriate clinical setting.      12/12/2017 -  Cancer Staged     LD Screening Chest CT:  New lytic lesions in the thoracic spine, compatible with metastases, as suggested on same-day MRI of the abdomen and pelvis.    -No pulmonary nodules identified.      02/16/2018 -  Cancer Staged     Prostate panel: No mutations identified. Tested genes: ATM, BRCA1, BRCA2, CHEK2, EPCAM*, MLH1, MSH2, MSH6, NBN, PMS2, TP53, HOXB13 (sequence change only)  02/27/2018 Endocrine/Hormone Therapy     OP LEUPROLIDE (22.5 MG EVERY 3 MONTHS)  Plan Provider: Towanda Malkin, MD         Past Medical History:   Diagnosis Date   ??? Congenital vascular abnormality    ??? COPD (chronic obstructive pulmonary disease) (CMS-HCC)    ??? Impotence of organic origin    ??? Inguinal hernia without mention of obstruction or gangrene, unilateral or unspecified, (not specified as recurrent)    ??? Malignant neoplasm of prostate (CMS-HCC)     01/16/2006 Gleason's 7 ( 4  3) right face, right mid medial, right mid lateral in left mid lateral high-grade PIN.  PSA 5.7 with 6% free PSA    ??? 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 Unspecified congenital anomaly of urinary system    ??? Unspecified hemorrhoids without mention of complication       Past Surgical History:   Procedure Laterality Date   ??? HEMORRHOID SURGERY     ??? PROSTATE CRYOABLATION      03/23/2006 left nerve sparing   ??? Seminal Vessicle Biopsy      12/22/2009        Family History   Problem Relation Age of Onset   ??? Prostate cancer Brother    ??? Heart disease Mother    ??? Diabetes Mother    ??? Diabetes Sister    ??? Hypertension Sister    ??? Heart disease Brother    ??? Sarcoidosis Son    ??? Kidney cancer Neg Hx    ??? GU problems Neg Hx    ??? Urolithiasis Neg Hx       Family Status   Relation Name Status   ??? Brother  Alive   ??? Mother  Deceased   ??? Father  Deceased   ??? Sister  Alive   ??? Sister  Alive   ??? Sister  Alive   ??? Sister  Alive   ??? Sister  Alive   ??? Sister  Alive   ??? Brother  Deceased   ??? Brother  Deceased   ??? Brother  Deceased   ??? Son Alive   ??? Neg Hx  (Not Specified)     Social History     Occupational History   ??? Not on file   Tobacco Use   ??? Smoking status: Current Every Day Smoker     Packs/day: 1.00     Years: 50.00     Pack years: 50.00     Types: Cigars   ??? Smokeless tobacco: Never Used   ??? Tobacco comment: 1 cigar a day to every two day   Substance and Sexual Activity   ??? Alcohol use: Yes     Alcohol/week: 19.0 standard drinks     Types: 12 Cans of beer, 7 Shots of liquor per week     Frequency: 4 or more times a week   ??? Drug use: Yes     Types: Marijuana     Comment: Rarely   ??? Sexual activity: Not on file       No Known Allergies    Current Outpatient Medications   Medication Sig Dispense Refill   ??? ADVAIR DISKUS 250-50 mcg/dose diskus      ??? albuterol (VENTOLIN HFA) 90 mcg/actuation inhaler Inhale.     ??? ascorbic acid (VITAMIN C) 1000 MG tablet Take 1,000 mg by mouth daily.     ???  aspirin (ECOTRIN) 81 MG tablet Take 81 mg by mouth. Frequency:QD   Dosage:81   MG  Instructions:  Note:Dose: 81MG      ??? cannabidiol, CBD, extract 100 mg/mL Soln Take by mouth daily at 10am.     ??? cholecalciferol, vitamin D3, 1,000 unit tablet Take by mouth.     ??? cod liver oil Oil Take by mouth.     ??? enzalutamide (XTANDI) 40 mg capsule TAKE 4 CAPSULES BY MOUTH ONCE DAILY 120 each 2   ??? garlic 1 mg cap Take by mouth.     ??? ginger, zingiber officinalis, (GINGER EXTRACT) 250 mg cap Take by mouth.     ??? glucosamine sulfate (GLUCOSAMINE) 500 mg Tab Take 1 tablet by mouth once daily.     ??? ibuprofen (MOTRIN) 800 MG tablet Take 1 tablet (800 mg total) by mouth every eight (8) hours as needed for pain. 270 tablet 3   ??? multivitamin capsule Take 1 capsule by mouth daily.     ??? nutritional supplement-fiber (QUINOA-KALE-HEMP) Liqd Take 1 Dose by mouth every other day. Hemp oil     ??? omeprazole (PRILOSEC) 20 MG capsule Take 20 mg by mouth.     ??? saw palmetto 160 mg cap Take 160 mg by mouth.     ??? tamsulosin (FLOMAX) 0.4 mg capsule Take 1 capsule (0.4 mg total) by mouth daily. 90 capsule 3   ??? UNABLE TO FIND Med Name: Taking moringa 1,000 mg daily, Baobab po daily and Sour Sop leaves tea daily to fight cancer     ??? UNABLE TO FIND Med Name: black seed oil     ??? vitamin E 400 UNIT capsule Take 400 Units by mouth daily.     ??? zinc-pumpkin seed oil-saw palm 7.5-40-160 mg cap Take by mouth.     ??? diaper,brief,adult,disposable Misc Use one diaper every 4 hours, sooner as needed       No current facility-administered medications for this visit.        The following portions of the patient's history were reviewed and updated as appropriate: allergies, current medications, past family history, past medical history, past social history, past surgical history and problem list.     Review of Systems   All other systems reviewed and are negative.        Vital Signs for this encounter:  BSA: 2.21 meters squared  BP 144/73  - Pulse 67  - Temp 36.2 ??C (97.2 ??F) (Temporal)  - Resp 17  - Ht 175.3 cm (5' 9)  - Wt 100.5 kg (221 lb 10.8 oz)  - SpO2 98%  - BMI 32.74 kg/m??     Physical Exam   Constitutional: He is oriented to person, place, and time. He appears well-developed and well-nourished. No distress.   HENT:   Head: Normocephalic.   Mouth/Throat: Oropharynx is clear and moist.   Eyes: Pupils are equal, round, and reactive to light. EOM are normal.   Neck: No JVD present. No thyromegaly present.   Cardiovascular: Normal rate, regular rhythm and normal heart sounds.   Pulmonary/Chest: Effort normal and breath sounds normal.   Abdominal: Bowel sounds are normal. He exhibits no distension and no mass. There is no tenderness.   Genitourinary:    Rectum normal.      Genitourinary Comments: 12/12/17:  There was a rock hard nodule, about 1.5 cm - 2 cm in the prostate bed.     Musculoskeletal:         General:  No edema.   Lymphadenopathy:   No adenopathy in the neck, supraclavicular, axillary or inguinal regions.   Neurological: He is alert and oriented to person, place, and time. No cranial nerve deficit. He exhibits normal muscle tone. Coordination normal.   Skin: No rash noted.   Psychiatric: He has a normal mood and affect. His behavior is normal.       Karnofsky/Lansky Performance Status  90,  Able to carry on normal activity; minor signs or symptoms of disease (ECOG equivalent 0)    Results:    WBC   Date Value Ref Range Status   08/28/2018 5.4 4.5 - 11.0 10*9/L Final     HGB   Date Value Ref Range Status   08/28/2018 13.1 (L) 13.5 - 17.5 g/dL Final     HCT   Date Value Ref Range Status   08/28/2018 40.2 (L) 41.0 - 53.0 % Final     Platelet   Date Value Ref Range Status   08/28/2018 277 150 - 440 10*9/L Final     Creatinine Whole Blood, POC   Date Value Ref Range Status   09/01/2017 0.8 0.8 - 1.4 mg/dL Final     Creatinine   Date Value Ref Range Status   08/28/2018 0.80 0.70 - 1.30 mg/dL Final     AST   Date Value Ref Range Status   08/28/2018 22 19 - 55 U/L Final     PSA Diagnostic   Date Value Ref Range Status   11/25/2014 0.7 0.0 - 4.0 ng/mL Final   10/14/2014 11.3 (H) 0.0 - 4.0 ng/mL Final   07/11/2014 5.8 (H) 0.0 - 4.0 ng/mL Final   04/09/2014 9.9 (H) 0.0 - 4.0 ng/mL Final

## 2018-08-28 ENCOUNTER — Ambulatory Visit
Admit: 2018-08-28 | Discharge: 2018-08-28 | Payer: MEDICARE | Attending: Hematology & Oncology | Primary: Hematology & Oncology

## 2018-08-28 ENCOUNTER — Ambulatory Visit: Admit: 2018-08-28 | Discharge: 2018-08-28 | Payer: MEDICARE

## 2018-08-28 DIAGNOSIS — C61 Malignant neoplasm of prostate: Principal | ICD-10-CM

## 2018-08-28 LAB — CBC W/ AUTO DIFF
BASOPHILS ABSOLUTE COUNT: 0.1 10*9/L (ref 0.0–0.1)
BASOPHILS RELATIVE PERCENT: 1.1 %
EOSINOPHILS ABSOLUTE COUNT: 0.2 10*9/L (ref 0.0–0.4)
EOSINOPHILS RELATIVE PERCENT: 3.2 %
HEMATOCRIT: 40.2 % — ABNORMAL LOW (ref 41.0–53.0)
HEMOGLOBIN: 13.1 g/dL — ABNORMAL LOW (ref 13.5–17.5)
LARGE UNSTAINED CELLS: 3 % (ref 0–4)
LYMPHOCYTES ABSOLUTE COUNT: 1.9 10*9/L (ref 1.5–5.0)
MEAN CORPUSCULAR HEMOGLOBIN CONC: 32.7 g/dL (ref 31.0–37.0)
MEAN CORPUSCULAR HEMOGLOBIN: 31.1 pg (ref 26.0–34.0)
MEAN CORPUSCULAR VOLUME: 95.1 fL (ref 80.0–100.0)
MEAN PLATELET VOLUME: 6.9 fL — ABNORMAL LOW (ref 7.0–10.0)
MONOCYTES ABSOLUTE COUNT: 0.4 10*9/L (ref 0.2–0.8)
MONOCYTES RELATIVE PERCENT: 6.9 %
NEUTROPHILS ABSOLUTE COUNT: 2.8 10*9/L (ref 2.0–7.5)
NEUTROPHILS RELATIVE PERCENT: 51.6 %
PLATELET COUNT: 277 10*9/L (ref 150–440)
RED BLOOD CELL COUNT: 4.23 10*12/L — ABNORMAL LOW (ref 4.50–5.90)
RED CELL DISTRIBUTION WIDTH: 14.1 % (ref 12.0–15.0)
WBC ADJUSTED: 5.4 10*9/L (ref 4.5–11.0)

## 2018-08-28 LAB — CALCIUM: Calcium:MCnc:Pt:Ser/Plas:Qn:: 10.7 — ABNORMAL HIGH

## 2018-08-28 LAB — HEPATIC FUNCTION PANEL
ALBUMIN: 4.3 g/dL (ref 3.5–5.0)
AST (SGOT): 22 U/L (ref 19–55)
BILIRUBIN TOTAL: 0.6 mg/dL (ref 0.0–1.2)
PROTEIN TOTAL: 6.8 g/dL (ref 6.5–8.3)

## 2018-08-28 LAB — PARATHYROID HORMONE INTACT: Parathyrin.intact:MCnc:Pt:Ser/Plas:Qn:: 70.9

## 2018-08-28 LAB — CREATININE
CREATININE: 0.8 mg/dL (ref 0.70–1.30)
EGFR CKD-EPI NON-AA MALE: 89 mL/min/{1.73_m2} (ref >=60)

## 2018-08-28 LAB — ALKALINE PHOSPHATASE: Alkaline phosphatase:CCnc:Pt:Ser/Plas:Qn:: 75

## 2018-08-28 LAB — PROSTATE SPECIFIC ANTIGEN: Prostate specific Ag:MCnc:Pt:Ser/Plas:Qn:: 39.5 — ABNORMAL HIGH

## 2018-08-28 LAB — HEMOGLOBIN: Lab: 13.1 — ABNORMAL LOW

## 2018-08-28 LAB — ALBUMIN: Albumin:MCnc:Pt:Ser/Plas:Qn:: 4.3

## 2018-08-28 LAB — EGFR CKD-EPI AA MALE: Lab: >90

## 2018-08-28 LAB — PHOSPHORUS: Phosphate:MCnc:Pt:Ser/Plas:Qn:: 4.9 — ABNORMAL HIGH

## 2018-08-28 MED ORDER — IBUPROFEN 800 MG TABLET
ORAL_TABLET | Freq: Three times a day (TID) | ORAL | 3 refills | 0 days | Status: CP | PRN
Start: 2018-08-28 — End: ?

## 2018-08-28 NOTE — Unmapped (Signed)
Brandon Olsen here for followup with Dr. Claude Manges.    Weight: Stable  Energy Level/Fatigue/Active Level: Energy level good - pt active  Elimination/Discharge: No issues  Nausea/Vomitting: None  Appetite/Diet: Adequate  Pain/Swelling: No pain right now - pt does report some back pain  Skin: Intact    Chemo: NA  Psychosocial: No needs  Refills of Meds: None    Flu vaccine already taken.    Pt received Lupron injection in right gluteal - due to Lupron shortage of 22.5mg  Dr. Claude Manges and pharmacy alright with giving two injections of 11.25mg . Pt tolerated it very well.    Notified Dr. Claude Manges.

## 2018-08-30 ENCOUNTER — Encounter: Payer: Self-pay | Admitting: *Deleted

## 2018-08-31 ENCOUNTER — Ambulatory Visit
Admission: RE | Admit: 2018-08-31 | Discharge: 2018-08-31 | Disposition: A | Discharge status: Home / Self Care | Payer: Medicare Other | Attending: Unknown Physician Specialty | Admitting: Unknown Physician Specialty

## 2018-08-31 ENCOUNTER — Encounter
Admission: RE | Disposition: A | Discharge status: Home / Self Care | Payer: Self-pay | Source: Home / Self Care | Attending: Unknown Physician Specialty

## 2018-08-31 ENCOUNTER — Ambulatory Visit: Payer: Medicare Other | Admitting: Anesthesiology

## 2018-08-31 ENCOUNTER — Encounter: Payer: Self-pay | Admitting: *Deleted

## 2018-08-31 DIAGNOSIS — D123 Benign neoplasm of transverse colon: Secondary | ICD-10-CM | POA: Insufficient documentation

## 2018-08-31 DIAGNOSIS — Z8546 Personal history of malignant neoplasm of prostate: Secondary | ICD-10-CM | POA: Diagnosis not present

## 2018-08-31 DIAGNOSIS — Z8589 Personal history of malignant neoplasm of other organs and systems: Secondary | ICD-10-CM | POA: Diagnosis not present

## 2018-08-31 DIAGNOSIS — E785 Hyperlipidemia, unspecified: Secondary | ICD-10-CM | POA: Insufficient documentation

## 2018-08-31 DIAGNOSIS — C155 Malignant neoplasm of lower third of esophagus: Secondary | ICD-10-CM | POA: Diagnosis not present

## 2018-08-31 DIAGNOSIS — J449 Chronic obstructive pulmonary disease, unspecified: Secondary | ICD-10-CM | POA: Insufficient documentation

## 2018-08-31 DIAGNOSIS — Z809 Family history of malignant neoplasm, unspecified: Secondary | ICD-10-CM | POA: Diagnosis not present

## 2018-08-31 DIAGNOSIS — K5730 Diverticulosis of large intestine without perforation or abscess without bleeding: Secondary | ICD-10-CM | POA: Diagnosis not present

## 2018-08-31 DIAGNOSIS — K210 Gastro-esophageal reflux disease with esophagitis: Secondary | ICD-10-CM | POA: Insufficient documentation

## 2018-08-31 DIAGNOSIS — Z7982 Long term (current) use of aspirin: Secondary | ICD-10-CM | POA: Insufficient documentation

## 2018-08-31 DIAGNOSIS — K259 Gastric ulcer, unspecified as acute or chronic, without hemorrhage or perforation: Secondary | ICD-10-CM | POA: Diagnosis not present

## 2018-08-31 DIAGNOSIS — Z1211 Encounter for screening for malignant neoplasm of colon: Secondary | ICD-10-CM | POA: Diagnosis present

## 2018-08-31 DIAGNOSIS — Z7951 Long term (current) use of inhaled steroids: Secondary | ICD-10-CM | POA: Diagnosis not present

## 2018-08-31 DIAGNOSIS — K2210 Ulcer of esophagus without bleeding: Secondary | ICD-10-CM | POA: Insufficient documentation

## 2018-08-31 DIAGNOSIS — Z833 Family history of diabetes mellitus: Secondary | ICD-10-CM | POA: Insufficient documentation

## 2018-08-31 DIAGNOSIS — D120 Benign neoplasm of cecum: Secondary | ICD-10-CM | POA: Diagnosis not present

## 2018-08-31 DIAGNOSIS — F17210 Nicotine dependence, cigarettes, uncomplicated: Secondary | ICD-10-CM | POA: Diagnosis not present

## 2018-08-31 DIAGNOSIS — Z791 Long term (current) use of non-steroidal anti-inflammatories (NSAID): Secondary | ICD-10-CM | POA: Diagnosis not present

## 2018-08-31 DIAGNOSIS — Z79899 Other long term (current) drug therapy: Secondary | ICD-10-CM | POA: Diagnosis not present

## 2018-08-31 DIAGNOSIS — Z86010 Personal history of colonic polyps: Secondary | ICD-10-CM | POA: Diagnosis not present

## 2018-08-31 DIAGNOSIS — K640 First degree hemorrhoids: Secondary | ICD-10-CM | POA: Diagnosis not present

## 2018-08-31 HISTORY — PX: COLONOSCOPY WITH PROPOFOL: SHX5780

## 2018-08-31 HISTORY — DX: Hyperlipidemia, unspecified: E78.5

## 2018-08-31 HISTORY — PX: ESOPHAGOGASTRODUODENOSCOPY (EGD) WITH PROPOFOL: SHX5813

## 2018-08-31 HISTORY — DX: Gastro-esophageal reflux disease without esophagitis: K21.9

## 2018-08-31 HISTORY — DX: Unspecified hemorrhoids: K64.9

## 2018-08-31 SURGERY — COLONOSCOPY WITH PROPOFOL
Anesthesia: General

## 2018-08-31 MED ORDER — LIDOCAINE HCL (CARDIAC) PF 100 MG/5ML IV SOSY
PREFILLED_SYRINGE | INTRAVENOUS | Status: DC | PRN
  Administered 2018-08-31: 100 mg via INTRAVENOUS

## 2018-08-31 MED ORDER — SODIUM CHLORIDE 0.9 % IV SOLN: INTRAVENOUS | Status: DC

## 2018-08-31 MED ORDER — PHENYLEPHRINE HCL 10 MG/ML IJ SOLN
INTRAMUSCULAR | Status: DC | PRN
  Administered 2018-08-31 (×3): 200 ug via INTRAVENOUS

## 2018-08-31 MED ORDER — GLYCOPYRROLATE PF 0.2 MG/ML IJ SOSY
PREFILLED_SYRINGE | INTRAMUSCULAR | Status: DC | PRN
  Administered 2018-08-31: .2 mg via INTRAVENOUS

## 2018-08-31 MED ORDER — GLYCOPYRROLATE 0.2 MG/ML IJ SOLN
INTRAMUSCULAR | Status: AC
  Filled 2018-08-31: qty 1

## 2018-08-31 MED ORDER — PROPOFOL 10 MG/ML IV BOLUS
INTRAVENOUS | Status: DC | PRN
  Administered 2018-08-31: 100 mg via INTRAVENOUS
  Administered 2018-08-31: 50 mg via INTRAVENOUS

## 2018-08-31 MED ORDER — PROPOFOL 500 MG/50ML IV EMUL
INTRAVENOUS | Status: AC
  Filled 2018-08-31: qty 50

## 2018-08-31 MED ORDER — PROPOFOL 500 MG/50ML IV EMUL
INTRAVENOUS | Status: DC | PRN
  Administered 2018-08-31: 160 ug/kg/min via INTRAVENOUS

## 2018-08-31 MED ORDER — LIDOCAINE HCL (PF) 2 % IJ SOLN
INTRAMUSCULAR | Status: AC
  Filled 2018-08-31: qty 10

## 2018-08-31 MED ORDER — PHENYLEPHRINE HCL 10 MG/ML IJ SOLN
INTRAMUSCULAR | Status: AC
  Filled 2018-08-31: qty 1

## 2018-08-31 MED ORDER — IPRATROPIUM-ALBUTEROL 0.5-2.5 (3) MG/3ML IN SOLN
RESPIRATORY_TRACT | Status: AC
  Administered 2018-08-31: 3 mL
  Filled 2018-08-31: qty 3

## 2018-08-31 MED ORDER — IPRATROPIUM-ALBUTEROL 0.5-2.5 (3) MG/3ML IN SOLN: 3.0000 mL | Freq: Once | RESPIRATORY_TRACT | Status: DC

## 2018-08-31 MED ORDER — PROPOFOL 10 MG/ML IV BOLUS
INTRAVENOUS | Status: AC
  Filled 2018-08-31: qty 20

## 2018-08-31 MED ORDER — SODIUM CHLORIDE 0.9 % IV SOLN
INTRAVENOUS | Status: DC
  Administered 2018-08-31 (×2): 1000 mL via INTRAVENOUS

## 2018-08-31 NOTE — Transfer of Care (Signed)
Immediate Anesthesia Transfer of Care Note  Patient: Angel Moses  Procedure(s) Performed: COLONOSCOPY WITH PROPOFOL (N/A ) ESOPHAGOGASTRODUODENOSCOPY (EGD) WITH PROPOFOL (N/A )  Patient Location: Endoscopy Unit  Anesthesia Type:General  Level of Consciousness: sedated  Airway & Oxygen Therapy: Patient Spontanous Breathing and Patient connected to nasal cannula oxygen  Post-op Assessment: Report given to RN and Post -op Vital signs reviewed and stable  Post vital signs: Reviewed and stable  Last Vitals:  Vitals Value Taken Time  BP 105/62 08/31/2018 10:19 AM  Temp    Pulse 72 08/31/2018 10:19 AM  Resp 16 08/31/2018 10:19 AM  SpO2 99 % 08/31/2018 10:19 AM  Vitals shown include unvalidated device data.  Last Pain:  Vitals:   08/31/18 0904  TempSrc: Tympanic  PainSc: 0-No pain         Complications: No apparent anesthesia complications

## 2018-08-31 NOTE — Anesthesia Preprocedure Evaluation (Signed)
Anesthesia Evaluation  Patient identified by MRN, date of birth, ID band Patient awake    Reviewed: Allergy & Precautions, H&P , NPO status , Patient's Chart, lab work & pertinent test results  History of Anesthesia Complications Negative for: history of anesthetic complications  Airway Mallampati: III  TM Distance: >3 FB Neck ROM: limited    Dental  (+) Chipped, Poor Dentition   Pulmonary neg shortness of breath, COPD,  COPD inhaler, Current Smoker,           Cardiovascular Exercise Tolerance: Good (-) angina(-) Past MI and (-) DOE negative cardio ROS       Neuro/Psych negative neurological ROS  negative psych ROS   GI/Hepatic Neg liver ROS, GERD  Medicated and Controlled,  Endo/Other  negative endocrine ROS  Renal/GU negative Renal ROS  negative genitourinary   Musculoskeletal   Abdominal   Peds  Hematology negative hematology ROS (+)   Anesthesia Other Findings Past Medical History: No date: Cancer ([place])     Comment:  Prostate Cancer No date: Cancer ([place])     Comment:  Bone and Bone Marrow No date: COPD (chronic obstructive pulmonary disease) (HCC) No date: Elevated PSA No date: GERD (gastroesophageal reflux disease) No date: Hemorrhoids No date: Hyperlipidemia  Past Surgical History: No date: HEMORRHOID SURGERY No date: PROSTATE CRYOABLATION  BMI    Body Mass Index:  32.49 kg/m      Reproductive/Obstetrics negative OB ROS                             Anesthesia Physical Anesthesia Plan  ASA: III  Anesthesia Plan: General   Post-op Pain Management:    Induction: Intravenous  PONV Risk Score and Plan: Propofol infusion and TIVA  Airway Management Planned: Natural Airway and Nasal Cannula  Additional Equipment:   Intra-op Plan:   Post-operative Plan:   Informed Consent: I have reviewed the patients History and Physical, chart, labs and discussed the  procedure including the risks, benefits and alternatives for the proposed anesthesia with the patient or authorized representative who has indicated his/her understanding and acceptance.   Dental Advisory Given  Plan Discussed with: Anesthesiologist, CRNA and Surgeon  Anesthesia Plan Comments: (Patient consented for risks of anesthesia including but not limited to:  - adverse reactions to medications - risk of intubation if required - damage to teeth, lips or other oral mucosa - sore throat or hoarseness - Damage to heart, brain, lungs or loss of life  Patient voiced understanding.)        Anesthesia Quick Evaluation

## 2018-08-31 NOTE — H&P (Signed)
Primary Care Physician:  Sofie Hartigan, MD Primary Gastroenterologist:  Dr. Vira Agar  Pre-Procedure History & Physical: HPI:  Angel Moses is a 72 y.o. male is here for an endoscopy and colonoscopy.   Past Medical History:  Diagnosis Date  . Cancer [facility])    Prostate Cancer  . Cancer ([place])    Bone and Bone Marrow  . COPD (chronic obstructive pulmonary disease) ([place])   . Elevated PSA   . GERD (gastroesophageal reflux disease)   . Hemorrhoids   . Hyperlipidemia     Past Surgical History:  Procedure Laterality Date  . HEMORRHOID SURGERY    . PROSTATE CRYOABLATION      Prior to Admission medications   Medication Sig Start Date End Date Taking? Authorizing Provider  albuterol (PROVENTIL HFA;VENTOLIN HFA) 108 (90 Base) MCG/ACT inhaler Inhale 2 puffs into the lungs every 6 (six) hours as needed for wheezing or shortness of breath.   Yes [provider]  Ascorbic Acid (VITAMIN C) 1000 MG tablet Take 1,000 mg by mouth daily.   Yes [provider]  aspirin EC 81 MG tablet Take 81 mg by mouth daily.   Yes [provider]  budesonide-formoterol (SYMBICORT) 80-4.5 MCG/ACT inhaler Inhale 2 puffs into the lungs 2 (two) times daily.   Yes [provider]  calcium carbonate (OS-CAL - DOSED IN MG OF ELEMENTAL CALCIUM) 1250 (500 Ca) MG tablet Take 1 tablet by mouth 2 (two) times daily with a meal.   Yes [provider]  CHLOROPHYLL PO Take 1 capsule by mouth daily.   Yes [provider]  Cholecalciferol 25 MCG (1000 UT) capsule Take 1,000 Units by mouth daily.   Yes [provider]  COD LIVER OIL PO Take 1 capsule by mouth daily.   Yes [provider]  enzalutamide Gillermina Phy) 40 MG capsule Take 40 mg by mouth daily.   Yes [provider]  Garlic 10 MG CAPS Take 1 capsule by mouth daily.   Yes [provider]  Ginger, Zingiber officinalis, (GINGER EXTRACT) 250 MG CAPS Take 1 capsule by mouth daily.   Yes  [provider]  Glucosamine 500 MG CAPS Take 1 capsule by mouth daily.   Yes [provider]  ibuprofen (ADVIL,MOTRIN) 800 MG tablet Take 800 mg by mouth every 8 (eight) hours as needed.   Yes [provider]  Multiple Vitamin (MULTIVITAMIN) tablet Take 1 tablet by mouth daily.   Yes [provider]  omeprazole (PRILOSEC) 20 MG capsule Take 20 mg by mouth daily.   Yes [provider]  PUMPKIN SEED PO Take 1 capsule by mouth daily.   Yes [provider]  saw palmetto 160 MG capsule Take 160 mg by mouth 2 (two) times daily.   Yes [provider]  vitamin E 400 UNIT capsule Take 400 Units by mouth daily.   Yes [provider]    Allergies as of 06/28/2018  . (No Known Allergies)    Family History  Problem Relation Age of Onset  . Diabetes Mother   . Diabetes Father   . Cancer Other     Social History   Socioeconomic History  . Marital status: Married    Spouse name: Not on file  . Number of children: Not on file  . Years of education: Not on file  . Highest education level: Not on file  Occupational History  . Not on file  Social Needs  . Financial resource strain: Not  on file  . Food insecurity:    Worry: Not on file    Inability: Not on file  . Transportation needs:    Medical: Not on file    Non-medical: Not on file  Tobacco Use  . Smoking status: Current Some Day Smoker    Packs/day: 0.25    Years: 25.00    Pack years: 6.25    Types: Cigarettes  . Smokeless tobacco: Never Used  Substance and Sexual Activity  . Alcohol use: Yes    Alcohol/week: 0.0 standard drinks    Comment: 12 pack  . Drug use: No  . Sexual activity: Yes  Lifestyle  . Physical activity:    Days per week: Not on file    Minutes per session: Not on file  . Stress: Not on file  Relationships  . Social connections:    Talks on phone: Not on file    Gets together: Not on file    Attends religious service: Not on file     Active member of club or organization: Not on file    Attends meetings of clubs or organizations: Not on file    Relationship status: Not on file  . Intimate partner violence:    Fear of current or ex partner: Not on file    Emotionally abused: Not on file    Physically abused: Not on file    Forced sexual activity: Not on file  Other Topics Concern  . Not on file  Social History Narrative  . Not on file    Review of Systems: See HPI, otherwise negative ROS  Physical Exam: BP 123/81   Pulse 72   Temp (!) 97 F (36.1 C) (Tympanic)   Resp 18   Ht 5\' 9"  (1.753 m)   Wt 99.8 kg   SpO2 100%   BMI 32.49 kg/m  General:   Alert,  pleasant and cooperative in NAD Head:  Normocephalic and atraumatic. Neck:  Supple; no masses or thyromegaly. Lungs:  Clear throughout to auscultation.    Heart:  Regular rate and rhythm. Abdomen:  Soft, nontender and nondistended. Normal bowel sounds, without guarding, and without rebound.   Neurologic:  Alert and  oriented x4;  grossly normal neurologically.  Impression/Plan: SOREN LAZARZ is here for an endoscopy and colonoscopy to be performed for [facility] colon polyp and dysphagia.  Last colon was 06/21/2013 when a polyp was removed.  Risks, benefits, limitations, and alternatives regarding  endoscopy and colonoscopy have been reviewed with the patient.  Questions have been answered.  All parties agreeable.   Gaylyn Cheers, MD  08/31/2018, 9:32 AM

## 2018-08-31 NOTE — Anesthesia Post-op Follow-up Note (Signed)
Anesthesia QCDR form completed.        

## 2018-08-31 NOTE — Anesthesia Postprocedure Evaluation (Signed)
Anesthesia Post Note  Patient: Angel Moses  Procedure(s) Performed: COLONOSCOPY WITH PROPOFOL (N/A ) ESOPHAGOGASTRODUODENOSCOPY (EGD) WITH PROPOFOL (N/A )  Patient location during evaluation: Endoscopy Anesthesia Type: General Level of consciousness: awake and alert Pain management: pain level controlled Vital Signs Assessment: post-procedure vital signs reviewed and stable Respiratory status: spontaneous breathing, nonlabored ventilation, respiratory function stable and patient connected to nasal cannula oxygen Cardiovascular status: blood pressure returned to baseline and stable Postop Assessment: no apparent nausea or vomiting Anesthetic complications: no     Last Vitals:  Vitals:   08/31/18 1039 08/31/18 1049  BP: 108/69 110/69  Pulse:    Resp:    Temp:    SpO2:      Last Pain:  Vitals:   08/31/18 1049  TempSrc:   PainSc: 0-No pain                 [name]

## 2018-08-31 NOTE — Op Note (Signed)
[facility] Gastroenterology Patient Name: Angel Moses Procedure Date: 08/31/2018 9:24 AM MRN: [id number] Account #: [id number] Date of Birth: 1946-04-13 Admit Type: Outpatient Age: 72 Room: [facility] ENDO ROOM 1 Gender: Male Note Status: Finalized Procedure:            Colonoscopy Indications:          High risk colon cancer surveillance: Personal history                        of colonic polyps Providers:            Manya Silvas, MD Referring MD:         Sofie Hartigan (Referring MD) Medicines:            Propofol per Anesthesia Complications:        No immediate complications. Procedure:            Pre-Anesthesia Assessment:                       - After reviewing the risks and benefits, the patient                        was deemed in satisfactory condition to undergo the                        procedure.                       After obtaining informed consent, the colonoscope was                        passed under direct vision. Throughout the procedure,                        the patient's blood pressure, pulse, and oxygen                        saturations were monitored continuously. The                        Colonoscope was introduced through the anus and                        advanced to the the cecum, identified by appendiceal                        orifice and ileocecal valve. The was introduced through                        the anus and advanced to the the cecum, identified by                        appendiceal orifice and ileocecal valve. The                        colonoscopy was performed without difficulty. The                        patient tolerated the procedure well. The quality of  the bowel preparation was adequate to identify polyps. Findings:      Rectal exam was done and the area felt rough which is to be expected       given his cancer of the prostate.      A small polyp was found in the transverse colon.  The polyp was sessile.       The polyp was removed with a hot snare. Resection and retrieval were       complete.      Multiple medium-mouthed diverticula were found in the transverse colon,       hepatic flexure and ascending colon.      A diminutive polyp was found in the cecum. The polyp was sessile. The       polyp was removed with a jumbo cold forceps. Resection and retrieval       were complete.      Internal hemorrhoids were found during endoscopy. The hemorrhoids were       small and Grade I (internal hemorrhoids that do not prolapse). Impression:           - One small polyp in the transverse colon, removed with                        a hot snare. Resected and retrieved.                       - Diverticulosis in the transverse colon, at the                        hepatic flexure and in the ascending colon.                       - One diminutive polyp in the cecum, removed with a                        jumbo cold forceps. Resected and retrieved.                       - Internal hemorrhoids. Recommendation:       - Await pathology results. Manya Silvas, MD 08/31/2018 10:23:59 AM This report has been signed electronically. Number of Addenda: 0 Note Initiated On: 08/31/2018 9:24 AM Scope Withdrawal Time: 0 hours 16 minutes 58 seconds  Total Procedure Duration: 0 hours 19 minutes 51 seconds       [facility]

## 2018-08-31 NOTE — Op Note (Signed)
[facility] Gastroenterology Patient Name: Angel Moses Procedure Date: 08/31/2018 9:23 AM MRN: [id number] Account #: [id number] Date of Birth: Feb 08, 1946 Admit Type: Outpatient Age: 72 Room: [facility] ENDO ROOM 1 Gender: Male Note Status: Finalized Procedure:            Upper GI endoscopy Indications:          Dysphagia, Heartburn, Suspected gastro-esophageal                        reflux disease Providers:            Manya Silvas, MD Referring MD:         Sofie Hartigan (Referring MD) Medicines:            Propofol per Anesthesia Complications:        No immediate complications. Procedure:            Pre-Anesthesia Assessment:                       - After reviewing the risks and benefits, the patient                        was deemed in satisfactory condition to undergo the                        procedure.                       After obtaining informed consent, the endoscope was                        passed under direct vision. Throughout the procedure,                        the patient's blood pressure, pulse, and oxygen                        saturations were monitored continuously. The Endoscope                        was introduced through the mouth, and advanced to the                        second part of duodenum. The upper GI endoscopy was                        accomplished without difficulty. The patient tolerated                        the procedure fairly well. Findings:      LA Grade C (one or more mucosal breaks continuous between tops of 2 or       more mucosal folds, less than 75% circumference) esophagitis with no       bleeding was found 40 cm from the incisors. Biopsies were taken with a       cold forceps for histology.      One non-bleeding cratered gastric ulcer with no stigmata of bleeding was       found in the gastric antrum. The lesion was 3 mm in largest dimension.      Diffuse and patchy mild inflammation characterized by  erythema and       granularity was found in the gastric antrum. Biopsies were taken with a       cold forceps for histology.      The examined duodenum was normal. Impression:           - LA Grade C reflux esophagitis. Rule out Barrett's                        esophagus. Biopsied.                       - Non-bleeding gastric ulcer with no stigmata of                        bleeding.                       - Erosive gastritis. Biopsied.                       - Normal examined duodenum. Recommendation:       - Await pathology results. Take medicine as directed. Manya Silvas, MD 08/31/2018 9:57:12 AM This report has been signed electronically. Number of Addenda: 0 Note Initiated On: 08/31/2018 9:23 AM      [facility]

## 2018-08-31 NOTE — Unmapped (Signed)
[facility] Refill Coordination Note  Specialty Medication(s): Xtandi 40mg  caps  Additional Medications shipped:      Brandon Olsen, DOB: 1945-11-02  Phone: [phone] (home) , Alternate phone contact: N/A  Phone or address changes today?: No  All above HIPAA information was verified with patient's family member.  Shipping Address: [address]  [place] [address]   Insurance changes? No    Completed refill call assessment today to schedule patient's medication shipment from the [facility] [phone]).      Confirmed the medication and dosage are correct and have not changed: Yes, regimen is correct and unchanged.    Confirmed patient started or stopped the following medications in the past month:  No, there are no changes reported at this time.    Are you tolerating your medication?:  Brandon Olsen reports tolerating the medication.    ADHERENCE    (Below is required for Medicare Part B or Transplant patients only - per drug):   How many tablets were dispensed last month: 120 caps  Patient currently has 14 remaining.    Did you miss any doses in the past 4 weeks? No missed doses reported.    FINANCIAL/SHIPPING    Delivery Scheduled: Yes, Expected medication delivery date: 123119     Medication will be delivered via Next Day Courier to the home address in [facility]    The patient will receive a drug information handout for each medication shipped and additional FDA Medication Guides as required.      Brandon Olsen did not have any additional questions at this time.    We will follow up with patient monthly for standard refill processing and delivery.      Thank you,  [name]   [facility] Specialty Technician

## 2018-09-03 ENCOUNTER — Telehealth: Payer: Self-pay

## 2018-09-03 LAB — SURGICAL PATHOLOGY

## 2018-09-03 NOTE — Telephone Encounter (Signed)
Received call from Dr. Vira Agar. Endoscopy performed 08/31/18. 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 Dr. Tiffany Kocher would like [place] to arrange EUS and I will place medical and radiation oncology referrals. 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 Lonia Chimera at [facility]. He is currently receiving Xtandi/Lupron.  I have left a voicemail with Angel Moses to return my call prior to arranging. I have sent Dr. Vira Agar a message letting him know that he is currently receiving care at [facility] and that Angel Moses may prefer care there since he is established.  Oncology Nurse Navigator Documentation  Navigator Location: [facility] (09/03/18 1500)   )Navigator Encounter Type: Telephone (09/03/18 1500) Telephone: Outgoing Call (09/03/18 1500)                                                  Time Spent with Patient: 15 (09/03/18 1500)

## 2018-09-04 NOTE — Unmapped (Signed)
As directed by Dr. Claude Manges, I have contacted the patient reviewed with him the following:    ?? PSA from 08/28/2018 resulted at 39.5 ng/mL and is consistent with the prior reading from 05/28/2018 of 33.4 ng/mL.    ?? No additional treatment or intervention is required at this time.    ?? The patient is tentatively scheduled to follow-up with Dr. Claude Manges on 11/27/2018.    Otherwise, the patient expressed verbal understanding and had no other questions or concerns at this time and is agreeable to the plan outlined above.  He is to contact the clinic back sooner should he have any other issues.    No other actions taken in Dr. Claude Manges is aware.

## 2018-09-06 ENCOUNTER — Other Ambulatory Visit: Payer: Self-pay | Admitting: *Deleted

## 2018-09-06 ENCOUNTER — Telehealth: Payer: Self-pay

## 2018-09-06 DIAGNOSIS — C159 Malignant neoplasm of esophagus, unspecified: Secondary | ICD-10-CM

## 2018-09-06 NOTE — Telephone Encounter (Signed)
I have not received call back from Angel Moses. Attempted to call again and his voicemail is now full. I will go ahead and place formal referrals to med/rad oncology and if he wishes to pursue his care at [facility] we can cancel these. He also needs EUS arranged per Dr. Vira Agar. I will continue to attempt contact. Oncology Nurse Navigator Documentation  Navigator Location: [facility] (09/06/18 1100)   )Navigator Encounter Type: Telephone (09/06/18 1100) Telephone: Outgoing Call (09/06/18 1100)                                                  Time Spent with Patient: 15 (09/06/18 1100)

## 2018-09-07 ENCOUNTER — Telehealth: Payer: Self-pay

## 2018-09-07 NOTE — Telephone Encounter (Signed)
Call placed to Mr. Foister. Voicemail continues to be full and I am unable to leave message. His appointment for med/rad oncology have been mailed to his home address. I will also try and contact Dr. Lonia Chimera at [facility] to make sure he is aware of this new esophageal finding.

## 2018-09-10 ENCOUNTER — Telehealth: Payer: Self-pay

## 2018-09-10 MED FILL — XTANDI 40 MG CAPSULE: 30 days supply | Qty: 120 | Fill #1 | Status: AC

## 2018-09-10 MED FILL — XTANDI 40 MG CAPSULE: ORAL | 30 days supply | Qty: 120 | Fill #1

## 2018-09-10 NOTE — Telephone Encounter (Signed)
error 

## 2018-09-10 NOTE — Telephone Encounter (Signed)
Mr. Angel Moses returned call. I talked to him regarding referral to cancer center for the fragment of adenocarcinoma that was found on the esophageal biopsy taken during his EGD, 08/31/18. I informed him that we have scheduled him, at this time, here at the cancer center. He confirmed that he does see Dr. Lonia Chimera, medical oncology, at [facility] for his stage IV prostate cancer. He is unaware if Dr. Lonia Chimera treats all types of cancer or just prostate. Either way, Dr. Lonia Chimera needs to be made aware of this new esophageal finding. I have sent Dr. Lonia Chimera a message in [place] and response is pending. I informed Mr. Angel Moses that Dr. Vira Agar would like to have an EUS performed. Educated further on the procedure. We can do that here at [facility]. We will keep his appointment scheduled here at [facility] for now. I have instructed Mr. Angel Moses to call Dr. Lonia Chimera today and notify him of this new diagnosis. We will be more than happy to see him at [facility], and work alongside Dr. Lonia Chimera to keep him closer to home. He will call me back after speaking with Dr. Lonia Chimera.

## 2018-09-10 NOTE — Unmapped (Signed)
Hi,     Nettie Elm contacted the [employer] requesting to speak with the care team of Brandon Olsen to discuss:    Patients spouse called to speak to Dr. Claude Manges about Dr. Markham Jordan.    Please contact Nettie Elm at [phone].    Program: Heme Malignancy  Speciality: Medical Oncology    Check Indicates criteria has been reviewed and confirmed with the patient:    [x]  Preferred Name   [x]  DOB and/or MR#  [x]  Preferred Contact Method  [x]  Phone Number(s)   []  MyChart     Thank you,   Jacques Navy  [facility]   [phone]

## 2018-09-11 NOTE — Unmapped (Signed)
Pt's spouse stated that Dr. Mechele Collin is concerned about some of the findings from the pt's endoscopy. Pt wanting to talk with Dr. Claude Manges. Informed pt the Dr. Claude Manges will be out for another week with limited access to messages. Informed pt that Dr. Claude Manges will be notified and a clinic appt would be added for the pt to see Dr. Claude Manges upon his return after September 17, 2018. Pt verbalized understanding and had no additional questions at this time.

## 2018-09-13 ENCOUNTER — Telehealth: Payer: Self-pay | Admitting: Oncology

## 2018-09-13 ENCOUNTER — Telehealth: Payer: Self-pay

## 2018-09-13 NOTE — Telephone Encounter (Signed)
Mr. Angel Moses left me a voicemail on 09/12/18 confirming that he has called Dr. Lonia Chimera and is is out of the office currently. I have called him and left a voicemail to call me to discuss if he would like to go forward with work up in his absence or await his return.

## 2018-09-13 NOTE — Telephone Encounter (Signed)
[facility] appts, per patient is [facility] patient & will be receing care at [facility], per [facility] msg.

## 2018-09-13 NOTE — Telephone Encounter (Signed)
Dr. Lonia Chimera has responded to my [facility] carelink message. 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 I am sending him a copy of the EGD report and pathology. I have left Mr. Hoare a voicemail that we will cancel his tentative appointments here and that he can be expecting a call from Dr. Vance Peper office to arrange an appointment there. Notified Mr. Weyer to call me if he has any questions or needs. I will also follow up that he has been scheduled with Dr. Lonia Chimera.  Chilton Greathouse, MD Sent to Mariea Clonts; Elinor Dodge, RN; Judithann Graves, RN; Cloyde Reams, CNA; Deborah Chalk Thank you for the note.  I have been out of the country for the past 10 days, but will be back in the office on 09/17/17.  We will work on contacting the patient and set up further evaluation and care for the esophageal cancer.  Can you please forward the EGD procedure report, and the pathology reports?  Also, if any imaging was done, please let me know and forward the reports.  I cannot locate in Epic.  Thanks!  Hello  everyone; We need to have the patient back to the clinic to discuss the new diagnosis.  We need to get copies of the outside EGD and pathology reports.  It will be best to have Path review the outside specimen. Thanks!  Tim [employer]

## 2018-09-24 ENCOUNTER — Ambulatory Visit: Payer: Self-pay | Admitting: Oncology

## 2018-09-24 ENCOUNTER — Institutional Professional Consult (permissible substitution): Payer: Self-pay | Admitting: Radiation Oncology

## 2018-09-27 ENCOUNTER — Ambulatory Visit
Admit: 2018-09-27 | Discharge: 2018-09-28 | Payer: MEDICARE | Attending: Hematology & Oncology | Primary: Hematology & Oncology

## 2018-09-27 DIAGNOSIS — C61 Malignant neoplasm of prostate: Principal | ICD-10-CM

## 2018-09-27 DIAGNOSIS — C159 Malignant neoplasm of esophagus, unspecified: Secondary | ICD-10-CM

## 2018-09-27 DIAGNOSIS — C155 Malignant neoplasm of lower third of esophagus: Secondary | ICD-10-CM

## 2018-09-27 DIAGNOSIS — C7951 Secondary malignant neoplasm of bone: Secondary | ICD-10-CM

## 2018-09-27 LAB — CBC W/ AUTO DIFF
BASOPHILS ABSOLUTE COUNT: 0 10*9/L (ref 0.0–0.1)
BASOPHILS RELATIVE PERCENT: 0.6 %
EOSINOPHILS ABSOLUTE COUNT: 0.2 10*9/L (ref 0.0–0.4)
EOSINOPHILS RELATIVE PERCENT: 4.1 %
HEMATOCRIT: 39.1 % — ABNORMAL LOW (ref 41.0–53.0)
HEMOGLOBIN: 12.9 g/dL — ABNORMAL LOW (ref 13.5–17.5)
LARGE UNSTAINED CELLS: 2 % (ref 0–4)
LYMPHOCYTES RELATIVE PERCENT: 34.2 %
MEAN CORPUSCULAR HEMOGLOBIN CONC: 32.9 g/dL (ref 31.0–37.0)
MEAN CORPUSCULAR HEMOGLOBIN: 31.1 pg (ref 26.0–34.0)
MEAN CORPUSCULAR VOLUME: 94.4 fL (ref 80.0–100.0)
MEAN PLATELET VOLUME: 7.2 fL (ref 7.0–10.0)
MONOCYTES ABSOLUTE COUNT: 0.2 10*9/L (ref 0.2–0.8)
MONOCYTES RELATIVE PERCENT: 4.7 %
NEUTROPHILS RELATIVE PERCENT: 54.1 %
PLATELET COUNT: 292 10*9/L (ref 150–440)
RED BLOOD CELL COUNT: 4.14 10*12/L — ABNORMAL LOW (ref 4.50–5.90)
WBC ADJUSTED: 4.6 10*9/L (ref 4.5–11.0)

## 2018-09-27 LAB — CREATININE
EGFR CKD-EPI AA MALE: >90 mL/min/{1.73_m2} (ref >=60)
EGFR CKD-EPI AA MALE: >90 mL/min/{1.73_m2} (ref >=60)
EGFR CKD-EPI NON-AA MALE: 86 mL/min/{1.73_m2} (ref >=60)
EGFR CKD-EPI NON-AA MALE: 87 mL/min/{1.73_m2} (ref >=60)

## 2018-09-27 LAB — HEPATIC FUNCTION PANEL
ALBUMIN: 4.4 g/dL (ref 3.5–5.0)
ALKALINE PHOSPHATASE: 82 U/L (ref 38–126)
ALT (SGPT): 12 U/L (ref <50)
AST (SGOT): 23 U/L (ref 19–55)
BILIRUBIN DIRECT: <0.1 mg/dL (ref 0.00–0.40)
BILIRUBIN TOTAL: 0.6 mg/dL (ref 0.0–1.2)

## 2018-09-27 LAB — LARGE UNSTAINED CELLS: Lab: 2

## 2018-09-27 LAB — PHOSPHORUS: Phosphate:MCnc:Pt:Ser/Plas:Qn:: 3.8

## 2018-09-27 LAB — CALCIUM
Calcium:MCnc:Pt:Ser/Plas:Qn:: 10.3 — ABNORMAL HIGH
Calcium:MCnc:Pt:Ser/Plas:Qn:: 10.4 — ABNORMAL HIGH

## 2018-09-27 LAB — AST (SGOT): Aspartate aminotransferase:CCnc:Pt:Ser/Plas:Qn:: 23

## 2018-09-27 LAB — EGFR CKD-EPI NON-AA MALE: Lab: 87

## 2018-09-27 LAB — PROSTATE SPECIFIC ANTIGEN: Prostate specific Ag:MCnc:Pt:Ser/Plas:Qn:: 43.7 — ABNORMAL HIGH

## 2018-09-27 LAB — ALBUMIN: Albumin:MCnc:Pt:Ser/Plas:Qn:: 4.4

## 2018-09-27 LAB — EGFR CKD-EPI AA MALE: Lab: >90

## 2018-09-27 NOTE — Unmapped (Signed)
Return Visit Note    Patient Name: Brandon Olsen  Patient Age: 73 y.o.  Encounter Date: 09/27/2018    Referring Physician:     Towanda Malkin, MD  [address]  [id number]  [place], [place] [address]    Primary Care Provider:  Marina Goodell, MD    Assessment/Plan:    Reason for visit:  Prostate Cancer    Cancer Staging  Malignant neoplasm of prostate (CMS-HCC)  Staging form: Prostate, AJCC 7th Edition  - Clinical: Stage IV (T2, N0, M1b, Gleason 7) - Unsigned        -- Adenocarcinoma, distal esophagus      -- EGD 08/31/18: Esophagitis, cancer noted in biopsy specimen; gastric ulcer/healing gastriris, no H Pylori  --Prostate cancer, Gleason 7, with rising PSA on Lupron and Casodex     --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 hemorrhoids    Recommendation:  --Patient has been found to have adenocarcinoma and a biopsy of the distal esophagus in an area of esophagitis.  He will be referred to Dr. Donalynn Furlong, and GI medicine, and a PET/CT will be obtained.  I had a long discussion with the patient and his family concerning the recent diagnosis, and likely treatment options.    --Patient was instructed to remain off of cigarettes (he has not smoked for 1 to 2 weeks).  -- The patient will continue on Lupron 22.5 mg IM, 3 3 months, with the next injection scheduled 11/26/2018; and will continue on [place].  He received Xgeva injection today..  --Patient is currently passing urine well; he will remain on tamsulosin and saw palmetto capsules.  .          I have reviewed the laboratory, pathology, and radiology reports in detail and discussed findings with the patient    History of Present Illness:     Brandon Olsen is a 73 y.o. male who is seen in consultation at the request of Lonn Georgia* for an evaluation of Prostate Cancer.  Patient returns to discuss recent upper endoscopy report.  On 08/31/2018, he had upper and lower endoscopy.  The upper endoscopy showed esophagitis, with possible Barrett's, erosive ulcer in the stomach, and lower endoscopy showed a polyp and diverticuli and internal hemorrhoids.  Biopsies showed polyp was benign and without dysplasia.  Gastric biopsy showed healing gastritis with out H. pylori.  Biopsy of the esophagitis revealed a fragment of adenocarcinoma, moderately differentiated.  Patient states he still having some difficulty swallowing solids, but otherwise doing well with regards to swallowing and appetite.  He still has diffuse aches and pains, chiefly now just in his lower back and sacrum, but these are not progressive and may be better.  He has not smoked anything for 1 or 2 weeks.  He denies headaches, confusion, shortness of breath, productive cough, hemoptysis, stomach pain fullness, fevers, chills, sweats, bleeding symptoms, bowel or bladder dysfunction.        Oncology History:  Patient was diagnosed in 2007, with Gleason 7 (4+3) right face, right mid medial, right mid lateral and left mid lateral high-grade PIN; PSA 5.7 with 6% free PSA .  He was treated with cryoablation.  He was without evidence of metastatic disease.  He did well, but was  noted to have elevated PSA at least as early as June 2014 (PSA 6.9).  He was followed without therapy until 10/14/2014, the PSA reached 11.6.  He was started on bicalutamide and Lupron, and has been on these agents since.  PSA was low until 10/26/2016, when it started to rise, first to 1.28, then on 08/11/2017 to 6.56, and on 11/07/17, 22.6 ng/mL. His last Lupron injection was a 63-month dose in 04/2017.   In December 2018, imaging showed no bony metastases but possible pelvic adenopathy and questionable nodules in the liver. In 12/2017, imaging with chest CT and Mabd/pelvic MRI showed pelvic adenopathy, and multiple bone mets.   The patient was referred to Dr Regino Schultze for consideration of involvement in a clinical trial, or Provenge.  He was not felt to be a candidate for Provenge, and eventually declined treatment on the study protocal.      Malignant neoplasm of prostate (CMS-HCC)    01/13/2006 Biopsy     Gleason's 7 ( 4  3) right face, right mid medial, right mid lateral in left mid lateral high-grade PIN.  PSA 5.7 with 6% free PSA       01/24/2006 -  Cancer Staged       Bone scan:  No evidence of metastatic disease      02/02/2006 -  Cancer Staged       CT AP:  Horseshoe kidney present      03/22/2006 Surgery     Cryoablation      12/02/2009 -  Cancer Staged       MRI Pelvis:  No definite abnormality noted within the prostate.   2. Possible rectal lesion along the anterior wall of the rectum. Direct   visualization of the rectum is suggested for further evaluation.       02/25/2010 -  Cancer Staged       PET scan:  Findings concerning for bony metastatic involvement in the anterior right   proximal femur and in the left side of the sacrum as described. Other   areas   are in regions of degenerative changes especially in the lumbar and   cervical   spine areas.      09/01/2017 -  Cancer Staged       CT AP:  Bilateral prominent subcentimeter pelvic sidewall lymph nodes which are indeterminate but early metastatic disease cannot be excluded. Attention on follow-up.    Multiple hepatic hypodensities, these have CT appearance of simple cysts and biliary hamartomas. This could be further evaluated with MRI.    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    A 2.6 cm hypodense lesion is detected in the subcutaneous tissues of the right gluteal region. This lesion centrally demonstrates low attenuation close to water. Could be the sequela of fat necrosis or a small fluid collection secondary to injection. The lesion demonstrates no obvious rim enhancement or associated stranding. Correlation with clinical findings is recommended to exclude an abscess.  ??      09/01/2017 -  Cancer Staged     Bone scan: No evidence of osseous metastatic disease          12/12/2017 -  Cancer Staged       MRI AP:  --Irregular prostate mass with extracapsular extension consistent with primary malignancy.  --Metastatic bilateral iliac and lower para-aortic lymphadenopathy.  --Vertebral and pelvic bone metastases.  --Peripherally enhancing fluid collection in the lateral aspect of the right gluteus maximus is favored to be posttraumatic in  nature. Abscess could have this appearance in appropriate clinical setting.      12/12/2017 -  Cancer Staged     LD Screening Chest CT:  New lytic lesions in the thoracic spine, compatible with metastases, as suggested on same-day MRI of the abdomen and pelvis.    -No pulmonary nodules identified.      02/16/2018 -  Cancer Staged     Prostate panel: No mutations identified. Tested genes: ATM, BRCA1, BRCA2, CHEK2, EPCAM*, MLH1, MSH2, MSH6, NBN, PMS2, TP53, HOXB13 (sequence change only)        02/27/2018 Endocrine/Hormone Therapy     OP LEUPROLIDE (22.5 MG EVERY 3 MONTHS)  Plan Provider: Towanda Malkin, MD         Past Medical History:   Diagnosis Date   ??? Congenital vascular abnormality    ??? COPD (chronic obstructive pulmonary disease) (CMS-HCC)    ??? Impotence of organic origin    ??? Inguinal hernia without mention of obstruction or gangrene, unilateral or unspecified, (not specified as recurrent)    ??? Malignant neoplasm of prostate (CMS-HCC)     01/16/2006 Gleason's 7 ( 4  3) right face, right mid medial, right mid lateral in left mid lateral high-grade PIN.  PSA 5.7 with 6% free PSA    ??? 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 Unspecified congenital anomaly of urinary system    ??? Unspecified hemorrhoids without mention of complication       Past Surgical History:   Procedure Laterality Date   ??? HEMORRHOID SURGERY     ??? PROSTATE CRYOABLATION      03/23/2006 left nerve sparing   ??? Seminal Vessicle Biopsy      12/22/2009        Family History   Problem Relation Age of Onset   ??? Prostate cancer Brother    ??? Heart disease Mother    ??? Diabetes Mother    ??? Diabetes Sister    ??? Hypertension Sister    ??? Heart disease Brother    ??? Sarcoidosis Son    ??? Kidney cancer Neg Hx    ??? GU problems Neg Hx    ??? Urolithiasis Neg Hx       Family Status   Relation Name Status   ??? Brother  Alive   ??? Mother  Deceased   ??? Father  Deceased   ??? Sister  Alive   ??? Sister  Alive   ??? Sister  Alive   ??? Sister  Alive   ??? Sister  Alive   ??? Sister  Alive   ??? Brother  Deceased   ??? Brother  Deceased   ??? Brother  Deceased   ??? Son  Alive   ??? Neg Hx  (Not Specified)     Social History     Occupational History   ??? Not on file   Tobacco Use   ??? Smoking status: Current Every Day Smoker     Packs/day: 1.00     Years: 50.00     Pack years: 50.00     Types: Cigars   ??? Smokeless tobacco: Never Used   ??? Tobacco comment: 1 cigar a day to every two day   Substance and Sexual Activity   ??? Alcohol use: Yes     Alcohol/week: 19.0 standard drinks     Types: 12 Cans of beer, 7 Shots of liquor per  week     Frequency: 4 or more times a week   ??? Drug use: Yes     Types: Marijuana     Comment: Rarely   ??? Sexual activity: Not on file       No Known Allergies    Current Outpatient Medications   Medication Sig Dispense Refill   ??? ascorbic acid (VITAMIN C) 1000 MG tablet Take 1,000 mg by mouth daily.     ??? aspirin (ECOTRIN) 81 MG tablet Take 81 mg by mouth. Frequency:QD   Dosage:81   MG  Instructions:  Note:Dose: 81MG      ??? cannabidiol, CBD, extract 100 mg/mL Soln Take by mouth daily at 10am.     ??? cholecalciferol, vitamin D3, 1,000 unit tablet Take by mouth.     ??? cod liver oil Oil Take by mouth.     ??? enzalutamide (XTANDI) 40 mg capsule TAKE 4 CAPSULES BY MOUTH ONCE DAILY 120 each 2   ??? garlic 1 mg cap Take by mouth.     ??? ginger, zingiber officinalis, (GINGER EXTRACT) 250 mg cap Take by mouth.     ??? glucosamine sulfate (GLUCOSAMINE) 500 mg Tab Take 1 tablet by mouth once daily.     ??? ibuprofen (MOTRIN) 800 MG tablet Take 1 tablet (800 mg total) by mouth every eight (8) hours as needed for pain. 270 tablet 3   ??? multivitamin capsule Take 1 capsule by mouth daily.     ??? nutritional supplement-fiber (QUINOA-KALE-HEMP) Liqd Take 1 Dose by mouth every other day. Hemp oil     ??? omeprazole (PRILOSEC) 20 MG capsule Take 20 mg by mouth.     ??? saw palmetto 160 mg cap Take 160 mg by mouth.     ??? tamsulosin (FLOMAX) 0.4 mg capsule Take 1 capsule (0.4 mg total) by mouth daily. 90 capsule 3   ??? UNABLE TO FIND Med Name: Taking moringa 1,000 mg daily, Baobab po daily and Sour Sop leaves tea daily to fight cancer     ??? UNABLE TO FIND Med Name: black seed oil     ??? vitamin E 400 UNIT capsule Take 400 Units by mouth daily.     ??? zinc-pumpkin seed oil-saw palm 7.5-40-160 mg cap Take by mouth.     ??? ADVAIR DISKUS 250-50 mcg/dose diskus      ??? albuterol (VENTOLIN HFA) 90 mcg/actuation inhaler Inhale.     ??? diaper,brief,adult,disposable Misc Use one diaper every 4 hours, sooner as needed       Current Facility-Administered Medications   Medication Dose Route Frequency Provider Last Rate Last Dose   ??? denosumab (XGEVA) injection 120 mg  120 mg Subcutaneous Once Elna Breslow Crandell, CPP           The following portions of the patient's history were reviewed and updated as appropriate: allergies, current medications, past family history, past medical history, past social history, past surgical history and problem list.     Review of Systems   All other systems reviewed and are negative.        Vital Signs for this encounter:  BSA: 2.2 meters squared  BP 137/78  - Pulse 70  - Temp 36.4 ??C (97.5 ??F) (Temporal)  - Wt 99.3 kg (218 lb 14.4 oz)  - SpO2 97%  - BMI 32.33 kg/m??     Physical Exam   Constitutional: He is oriented to person, place, and time. He appears well-developed and well-nourished. No distress.   HENT:  Head: Normocephalic.   Mouth/Throat: Oropharynx is clear and moist.   Eyes: Pupils are equal, round, and reactive to light. EOM are normal.   Neck: No JVD present. No thyromegaly present.   Cardiovascular: Normal rate, regular rhythm and normal heart sounds.   Pulmonary/Chest: Effort normal and breath sounds normal.   Abdominal: Bowel sounds are normal. He exhibits no distension and no mass. There is no tenderness.   Genitourinary:    Rectum normal.      Genitourinary Comments: 12/12/17:  There was a rock hard nodule, about 1.5 cm - 2 cm in the prostate bed.     Musculoskeletal:         General: No edema.   Lymphadenopathy:   No adenopathy in the neck, supraclavicular, axillary or inguinal regions.   Neurological: He is alert and oriented to person, place, and time. No cranial nerve deficit. He exhibits normal muscle tone. Coordination normal.   Skin: No rash noted.   Psychiatric: He has a normal mood and affect. His behavior is normal.       Karnofsky/Lansky Performance Status  90,  Able to carry on normal activity; minor signs or symptoms of disease (ECOG equivalent 0)    Results:    WBC   Date Value Ref Range Status   09/27/2018 4.6 4.5 - 11.0 10*9/L Final     HGB   Date Value Ref Range Status   09/27/2018 12.9 (L) 13.5 - 17.5 g/dL Final     HCT   Date Value Ref Range Status   09/27/2018 39.1 (L) 41.0 - 53.0 % Final     Platelet   Date Value Ref Range Status   09/27/2018 292 150 - 440 10*9/L Final     Creatinine Whole Blood, POC   Date Value Ref Range Status   09/01/2017 0.8 0.8 - 1.4 mg/dL Final     Creatinine   Date Value Ref Range Status   09/27/2018 0.87 0.70 - 1.30 mg/dL Final   13/04/6577 4.69 0.70 - 1.30 mg/dL Final     AST   Date Value Ref Range Status   09/27/2018 23 19 - 55 U/L Final     PSA Diagnostic   Date Value Ref Range Status   11/25/2014 0.7 0.0 - 4.0 ng/mL Final   10/14/2014 11.3 (H) 0.0 - 4.0 ng/mL Final   07/11/2014 5.8 (H) 0.0 - 4.0 ng/mL Final   04/09/2014 9.9 (H) 0.0 - 4.0 ng/mL Final

## 2018-09-27 NOTE — Unmapped (Signed)
[facility] Refill Coordination Note  Specialty Medication(s): Brandon Olsen 40mg    Additional Medications shipped:      Brandon Olsen, DOB: 06/11/1946  Phone: [phone] (home) , Alternate phone contact: N/A  Phone or address changes today?: No  All above HIPAA information was verified with patient's family member.  Shipping Address: [address]  [place] [address]   Insurance changes? No    Completed refill call assessment today to schedule patient's medication shipment from the [facility] [phone]).      Confirmed the medication and dosage are correct and have not changed: Yes, regimen is correct and unchanged.    Confirmed patient started or stopped the following medications in the past month:  No, there are no changes reported at this time.    Are you tolerating your medication?:  Brandon Olsen reports tolerating the medication.    ADHERENCE    (Below is required for Medicare Part B or Transplant patients only - per drug):   How many tablets were dispensed last month: 120 c aps  Patient currently has 2 weeks  remaining.    Did you miss any doses in the past 4 weeks? Yes.  Brandon Olsen reports missing 1 dose days of medication therapy in the last 4 weeks.  Brandon Olsen reports forgot as the cause of their non-adherance.    FINANCIAL/SHIPPING    Delivery Scheduled: Yes, Expected medication delivery date: [id number]     Medication will be delivered via Next Day Courier to the home address in [place]    The patient will receive a drug information handout for each medication shipped and additional FDA Medication Guides as required.      Brandon Olsen did not have any additional questions at this time.    We will follow up with patient monthly for standard refill processing and delivery.      Thank you,  [name]   [facility] Specialty Technician

## 2018-10-04 ENCOUNTER — Encounter
Admit: 2018-10-04 | Discharge: 2018-10-04 | Payer: MEDICARE | Attending: Hematology & Oncology | Primary: Hematology & Oncology

## 2018-10-04 DIAGNOSIS — C159 Malignant neoplasm of esophagus, unspecified: Principal | ICD-10-CM

## 2018-10-05 NOTE — Unmapped (Signed)
Spoke with pt's spouse and son regarding appts for upcoming PET scan and appointments with Dr. Nedra Hai and Dr. Lenise Arena. Following information given:    Thursday, Jan. 30    Check-in: 9am (in main lobby of cancer hospital)  PET Prep: 9:30am  PET Scan: 11am    Friday, Jan. 31    Check-in: 12:30pm  Dr. Nedra Hai Appt: 1pm  Dr. Lenise Arena Appt: 3:30pm    Discussed where to park and which pedestrian bridge/walk way to take to the Cancer hospital.    Pt's spouse and son verbalized understanding and had no additional questions at this time.

## 2018-10-08 NOTE — Unmapped (Signed)
As requested by the patient's son Nghia Mcentee, the following is been completed:    ?? FLMA paperwork has been submitted to [employer]. on behalf of the patient's son, Brycen Bean.    ?? The original version of the submitted documents are archived in the media section of the electronic medical record.    ?? Given that the patient's plan of care is not finalized, the FLMA request forms may need to be addended to reflect any future updates.    No other actions taken at this time and Dr. Claude Manges is aware.

## 2018-10-08 NOTE — Unmapped (Signed)
Brandon Olsen 's Xtandi shipment will be delayed due to Cost Increase We have contacted the patient and communicated the delivery change to patient/caregiver We will call the patient to reschedule the delivery upon resolution. We have confirmed the delivery date as n/a .

## 2018-10-11 ENCOUNTER — Ambulatory Visit: Admit: 2018-10-11 | Discharge: 2018-10-12 | Payer: MEDICARE

## 2018-10-11 DIAGNOSIS — C155 Malignant neoplasm of lower third of esophagus: Principal | ICD-10-CM

## 2018-10-11 DIAGNOSIS — C159 Malignant neoplasm of esophagus, unspecified: Secondary | ICD-10-CM

## 2018-10-12 ENCOUNTER — Ambulatory Visit: Admit: 2018-10-12 | Discharge: 2018-10-13 | Payer: MEDICARE | Attending: Surgery | Primary: Surgery

## 2018-10-12 ENCOUNTER — Ambulatory Visit
Admit: 2018-10-12 | Discharge: 2018-10-13 | Payer: MEDICARE | Attending: Hematology & Oncology | Primary: Hematology & Oncology

## 2018-10-12 DIAGNOSIS — C159 Malignant neoplasm of esophagus, unspecified: Principal | ICD-10-CM

## 2018-10-12 NOTE — Unmapped (Addendum)
It was a pleasure seeing you today. You will return to see Dr. Lenise Arena after you complete your EUS (endoscopic ultrasound)     Patient Education        Learning About Esophageal Cancer  What is it?    Esophageal cancer is the growth of abnormal cells in your esophagus. That's the hollow tube that connects your throat to your stomach. The cancer most often starts in the cells that line the inside of the esophagus.  What are the symptoms?  Symptoms of this type of cancer can include:  ?? Trouble swallowing.  ?? Feeling like there's something stuck in your throat.  ?? Pain when you swallow.  ?? Weight loss.  ?? Pain behind your breastbone (sternum).  ?? Hoarseness and coughing.  ?? Stomach upset (indigestion) and heartburn.  How is it diagnosed?  Your doctor will ask you questions about your and your family's past health. He or she will do a physical exam.  Your doctor likely will do an endoscopy. This is a test that lets your doctor look at the inside of your esophagus. The doctor uses a thin, lighted tube (endoscope) that bends. He or she uses it to look at your stomach and the first part of your small intestine. The doctor can also use the scope to take a sample of tissue for study (a biopsy).  You may need more tests. These tests may include a CT scan, a PET scan, or an ultrasound of your esophagus.  How is it treated?  Treatment for esophageal cancer is based on the stage of the cancer and other things, such as your overall health. The main treatment options include:  Endoscopic treatment.  For early cancer, the doctor may use an endoscope to guide tools down your throat. The tools are used to remove or destroy the cancer.  Surgery.  For cancer deeper in the tissue, you may have surgery to remove part of the esophagus.  Radiation therapy.  This uses high-dose X-rays to destroy cancer cells and shrink tumors.  Chemotherapy.  These medicines kill fast-growing cells, including cancer cells and some normal cells. Chemotherapy and radiation may be given together. This is called chemoradiation.  Your doctor will talk with you about your options and then make a treatment plan.  What are some other things to think about?  ?? Some tumors are aggressive and need treatment right away. But most cancer grows slowly enough that you can take a little time to find out more about your cancer as you decide about treatment.  ?? Think about getting a second opinion from another doctor. Before you start major treatment, it's a good idea to check with another doctor about the type of cancer you have and the stage of your cancer. Your doctor or insurance company can recommend someone for a second opinion.  ?? Ask any questions you might have. You can talk to your doctor, nurses, counselors, and other advisors.  ?? Talk to family, friends, and supporters. Get the kinds of help you need.  Follow-up care is a key part of your treatment and safety. Be sure to make and go to all appointments, and call your doctor if you are having problems. It's also a good idea to know your test results and keep a list of the medicines you take.  Where can you learn more?  Go to [facility] at https://myuncchart.org  [facility] under [employer]. Enter E145 in the search box to learn more about  Learning About Esophageal Cancer.  Current as of: May 02, 2018  Content Version: 12.3  ?? 2006-2019 [employer]. Care instructions adapted under license by [facility]. If you have questions about a medical condition or this instruction, always ask your healthcare professional. [employer], Inc[employer] disclaims any warranty or liability for your use of this information.

## 2018-10-12 NOTE — Unmapped (Signed)
ASSESSMENT AND PLAN:  Mr. 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    1. Esophageal cancer  -We discussed the diagnosis of esophageal cancer. Interestingly, the EGD did not show an obvious mass, just what appeared to be esophagitis, and the biopsy showed at least intramucosal adenocarcinoma but no definitive evidence of invasive adenocarcinoma. Given this, I am hopeful that perhaps the cancer is more superficial and less deeply invasive. We discussed that if the cancer is quite superficial, it could probably be managed primarily with surgery, or perhaps even endoscopically if T1b or less. If the cancer is more deeply invasive or has positive lymph nodes, then we would need to meet again to discuss chemoradiation. I would recommend proceeding with upper EUS to complete staging and better understand the T and N stage and assess the extent of disease. Patient and his family are in agreement with this.    2. Prostate cancer, metastatic to abdominal lymph nodes and bones. Managed by Dr. 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 Per PREVAIL trial the median OS is 35 mo and median time to PSA progression exceeds 11 mo, and he just started enzalutamide in Nov 2019. Thus he would have a median of 3 yrs survival expected. Given this time course, I think it is reasonable to consider therapy options for the esophageal cancer without restriction from the prostate cancer diagnosis, though would be reasonable to aim for a less aggressive treatment recommendation for esophageal cancer if possible.    3. Supportive  -No significant pain  -No significant emotional distress  -Has maintained nutritional status and no significant weight loss.    4. Disposition  -Will follow up with Dr. Lenise Arena after EUS -----------------  REFERRING PHYSICIAN:  Camillo Flaming     I was asked to see [name] in consultation for evaluation of distal esophageal adenocarcinoma    ONCOLOGIC HISTORY:  -08/31/18: EGD for dysphagia showed LA Grade C esophagitis with no bleeding found 40 cm from incisors, with biopsies taken. One non-bleeding cratered gastric ulcer with no stigmata of bleeding was found in gastric antrum, 3 mm in diameter. Diffuse and patchy mild inflammation characterized by erythema and granularity found in gastric antrum, biopsied.  Pathology of distal esophagus biopsy showed superficial fragments of at least intramucosal adenocarcinoma, moderately differentiated. No intestinal metaplasia identified. Pathology of gastric antral biopsy showed antral mucosa with healing erosive gastritis; negative for H pylori, dysplasia, and malignancy. Colonoscopy found small sessile transverse colon polyp and cecal polyp which were removed, and pathology of both showed tubular adenoma.  -10/11/18: PET-CT showed circumferential thickening of the distal esophagus with increased FDG uptake, likely correlating to site of known malignancy. Focal uptake in the prostate to the right of midline likely represents prostate malignancy. Left para-aortic lymph nodes with mild FDG uptake, similar in size to prior MRI. These may be related to prostate cancer given low avidity. Redemonstrated multifocal osseous metastases; only the L1 vertebral body metastasis is metabolically active. Note that given low avidity, at least some of these may be related to prostate cancer rather than esophageal cancer. FDG uptake in the right posterior gluteal musculature, likely inflammatory; associated fluid collection on prior MRI has mostly resolved.        HISTORY OF PRESENT ILLNESS:  Mr. Gladu is a 73 yo M from [place], [place], with metastatic  prostate cancer on Lupron + enzalutamide, who presents for evaluation of recently diagnosed distal esophageal adenocarcinoma. Briefly, he states that he started to have mild dysphagia perhaps in late Nov 2019. He had EGD and colonoscopy done 08/31/18, and the EGD showed what appeared to be esophagitis at 40 cm from the incisors, with a biopsy taken. However, the biopsy result showed at least intramucosal adenocarcinoma. PET-CT on 10/11/18 showed increased FDG uptake in the distal esophagus; there were also areas of uptake in the prostate, left para-aortic nodes, and multiple bones, thought to be related to his history of metastatic prostate cancer, but with no evidence of metastases from the esophageal cancer. He now presents for further evaluation.    He reports feeling fairly well. Though he has mild dysphagia, he reports no marked difficulty eating. He has no odynophagia or weight loss. Good appetite. Has some acid reflux but not too significant. No hematochezia or melena. Has good energy level. He is still working as a Arboriculturist full time. He is active and running errands. No nausea or vomiting. No abdominal pain or pain elsewhere. No significant symptoms from the prostate cancer other than urinary symptoms.     He is accompanied in clinic by his wife and son.      REVIEW OF SYSTEMS:  Constitutional: No weight loss, fevers, or chills  HEENT: No double vision or blurry vision.  RESP: No dyspnea or cough. No hemoptysis  CARDIAC: no chest pain or palpitations  GI: No nausea, vomiting, diarrhea, constipation, hematochezia, melena, or odynophagia. No abdominal pain. Mild dysphagia  GU: No dysuria or hematuria  MSK: No new bony or joint pains.  DERM: No rashes or changes in skin.  HEM/LYMPH: No easy bleeding or bruising, no enlarged lymph nodes  NEURO: No dizziness, weakness, numbness, or tingling.  Remainder of review of systems negative.  ECOG PS 1      PAST MEDICAL HISTORY:  Past Medical History:   Diagnosis Date   ??? Congenital vascular abnormality    ??? COPD (chronic obstructive pulmonary disease) (CMS-HCC)    ??? Impotence of organic origin    ??? Inguinal hernia without mention of obstruction or gangrene, unilateral or unspecified, (not specified as recurrent)    ??? Malignant neoplasm of prostate (CMS-HCC)     01/16/2006 Gleason's 7 ( 4  3) right face, right mid medial, right mid lateral in left mid lateral high-grade PIN.  PSA 5.7 with 6% free PSA    ??? 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 Unspecified congenital anomaly of urinary system    ??? Unspecified hemorrhoids without mention of complication      Hx of metastatic prostate cancer to bone and pelvic  nodes, now on Lupron and Xtandi along with Xgeva. Initially diagnosed 2007 with Gleason 4+3 prostate cancer treated with cryoablation. In Feb 2016 PSA rose to 11.6 and started Lupron + bicalutamide. In Feb 2018 PSA started to rise, reaching 22.6 in late Feb 2019. Imaging in Apr 2019 showed pelvic adenopathy and bone metastases. Started Lupron + enzalutamide in Nov 2019      FAMILY HISTORY:  Brother had prostate cancer  Paternal uncle had unknown kind of cancer    Had negative germline testing for prostate cancer history    SOCIAL HISTORY:  Lives with wife and daughter. Has a son and daughter. Works as a Arboriculturist. Previously smoked cigarettes up to 1/2-1/3 ppd x 15-20 yrs,  then smoked a cigar <1 per day and quit in Dec 2019. Has 2-3 drinks per week; no past history of more heaving drinking. No illicits    ALLERGIES:  No Known Allergies      MEDICATIONS:    Current Outpatient Medications:   ???  ADVAIR DISKUS 250-50 mcg/dose diskus, , Disp: , Rfl:   ???  albuterol (VENTOLIN HFA) 90 mcg/actuation inhaler, Inhale., Disp: , Rfl:   ???  ascorbic acid (VITAMIN C) 1000 MG tablet, Take 1,000 mg by mouth daily., Disp: , Rfl:   ???  aspirin (ECOTRIN) 81 MG tablet, Take 81 mg by mouth. Frequency:QD   Dosage:81   MG  Instructions:  Note:Dose: 81MG , Disp: , Rfl:   ???  cannabidiol, CBD, extract 100 mg/mL Soln, Take by mouth daily at 10am., Disp: , Rfl:   ???  cholecalciferol, vitamin D3, 1,000 unit tablet, Take by mouth., Disp: , Rfl:   ???  cod liver oil Oil, Take by mouth., Disp: , Rfl:   ???  diaper,brief,adult,disposable Misc, Use one diaper every 4 hours, sooner as needed, Disp: , Rfl:   ???  enzalutamide (XTANDI) 40 mg capsule, TAKE 4 CAPSULES BY MOUTH ONCE DAILY, Disp: 120 each, Rfl: 2  ???  garlic 1 mg cap, Take by mouth., Disp: , Rfl:   ???  ginger, zingiber officinalis, (GINGER EXTRACT) 250 mg cap, Take by mouth., Disp: , Rfl:   ???  glucosamine sulfate (GLUCOSAMINE) 500 mg Tab, Take 1 tablet by mouth once daily., Disp: , Rfl:   ???  ibuprofen (MOTRIN) 800 MG tablet, Take 1 tablet (800 mg total) by mouth every eight (8) hours as needed for pain., Disp: 270 tablet, Rfl: 3  ???  multivitamin capsule, Take 1 capsule by mouth daily., Disp: , Rfl:   ???  nutritional supplement-fiber (QUINOA-KALE-HEMP) Liqd, Take 1 Dose by mouth every other day. Hemp oil, Disp: , Rfl:   ???  omeprazole (PRILOSEC) 20 MG capsule, Take 20 mg by mouth., Disp: , Rfl:   ???  saw palmetto 160 mg cap, Take 160 mg by mouth., Disp: , Rfl:   ???  tamsulosin (FLOMAX) 0.4 mg capsule, Take 1 capsule (0.4 mg total) by mouth daily., Disp: 90 capsule, Rfl: 3  ???  UNABLE TO FIND, Med Name: Taking moringa 1,000 mg daily, Baobab po daily and Sour Sop leaves tea daily to fight cancer, Disp: , Rfl:   ???  UNABLE TO FIND, Med Name: black seed oil, Disp: , Rfl:   ???  vitamin E 400 UNIT capsule, Take 400 Units by mouth daily., Disp: , Rfl:   ???  zinc-pumpkin seed oil-saw palm 7.5-40-160 mg cap, Take by mouth., Disp: , Rfl:   No current facility-administered medications for this visit.           PHYSICAL EXAM:  VITALS: BP 135/70  - Pulse 71  - Temp 36.9 ??C (98.5 ??F) (Oral)  - Resp 18  - Ht 175.3 cm (5' 9)  - Wt 99.7 kg (219 lb 11.2 oz)  - SpO2 98%  - BMI 32.44 kg/m??   GENERAL: well-built, well-appearing, in no acute distress  HEENT: Normocephalic, atraumatic. Sclerae anicteric. Conjunctivae without pallor. Oropharynx clear. Mucous membranes moist.  NECK: Supple. No palpable cervical or supraclavicular lymphadenopathy  RESP: Lungs clear to auscultation bilaterally.   CV: Regular rate and rhythm, normal S1 and S2, no murmurs/rubs/gallops  BACK: Nontender on palpation over midline spinous processes.  ABD: Soft, nontender, nondistended, normoactive bowel sounds. No palpable masses or  hepatosplenomegaly.  EXTREMITIES: Warm and well perfused. No peripheral edema. 2+ radial and DP pulses bilaterally  SKIN: No rashes or lesions noted  PSYCH: Affect appropriate.  NEURO: Alert and oriented x3. Pupils equal, round, and reactive to light. Normal gait      LABS:  Reviewed. Notably following labs:  Lab Results   Component Value Date    WBC 4.6 09/27/2018    HGB 12.9 (L) 09/27/2018    HCT 39.1 (L) 09/27/2018    PLT 292 09/27/2018       Lab Results   Component Value Date    NA 141 12/12/2017    K 4.9 12/12/2017    CL 103 12/12/2017    CO2 31.0 (H) 12/12/2017    BUN 26 (H) 12/12/2017    CREATININE 0.87 09/27/2018    CREATININE 0.84 09/27/2018    GLU 103 12/12/2017    CALCIUM 10.3 (H) 09/27/2018    CALCIUM 10.4 (H) 09/27/2018    PHOS 3.8 09/27/2018       Lab Results   Component Value Date    BILITOT 0.6 09/27/2018    BILIDIR <0.10 09/27/2018    PROT 6.7 09/27/2018    ALBUMIN 4.4 09/27/2018    ALBUMIN 4.4 09/27/2018    ALT 12 09/27/2018    AST 23 09/27/2018    ALKPHOS 82 09/27/2018         IMAGING:  Reviewed results and personally reviewed images.  -10/11/18: PET-CT showed circumferential thickening of the distal esophagus with increased FDG uptake, likely correlating to site of known malignancy. Focal uptake in the prostate to the right of midline likely represents prostate malignancy. Left para-aortic lymph nodes with mild FDG uptake, similar in size to prior MRI. These may be related to prostate cancer given low avidity. Redemonstrated multifocal osseous metastases; only the L1 vertebral body metastasis is metabolically active. Note that given low avidity, at least some of these may be related to prostate cancer rather than esophageal cancer. FDG uptake in the right posterior gluteal musculature, likely inflammatory; associated fluid collection on prior MRI has mostly resolved.      Madelin Headings, MD  [facility]  Gastrointestinal Medical Oncology

## 2018-10-12 NOTE — Unmapped (Signed)
Patient Name: Brandon Olsen  Patient Age: 73 y.o.  Encounter Date: 10/12/2018    REFERRING PHYSICIAN:  Towanda Malkin, MD  [address]  [phone]  [place], [place] [address]    CONSULTING PHYSICIANS:  Patient Care Team:  Jamelle Haring, MD as PCP - General (Family Medicine)  Camillo Flaming, MD as Attending Provider (Surgical Oncology)  Carleene Mains, RN as Registered Nurse (Oncology Navigator)    PRIMARY CARE PROVIDER:  Marina Goodell, MD    DIAGNOSIS: Adenocarcinoma of the distal esophagus    HISTORY OF PRESENT ILLNESS:    Brandon Olsen is a 73 y.o. male with a history of stage IV prostate cancer to bone and pelvic nodes (now on Lupron and Xtandi with Xgeva, Gleason 7) who is seen in consultation at the request of Brandon Olsen* for a new adenocarcinoma of the distal esophagus.     In November 2019,Brandon Olsen noted mild dysphagia, he underwent EGD on 08/31/2018 which showed LA Grade C reflux esophagitis with no bleeding 40 cm form the incisors; biopsy of the reflux esophagitis was positive for adenocarcinoma. Subsequent staging PET on 10/11/2018 which showed circumferential thickening of the distal esophagus with increased FDG uptake, as well as focal uptake in the prostate, left para-aortic lymph nodes, and multifocal osseous metastases. The bone and lymph node avidity was thought to be 2/2 known metastatic prostate cancer. Also noted on PET was uptake in the right posterior gluteal musculature, thought to be inflammatory.      Brandon Olsen endorses continued dysphagia which has not worsened and remains mild. He is able to tolerate regular foods without difficulty, only experiences dysphagia when eating fast. No weight loss, nausea, vomiting, changes in appetite, changes in bowel or bladder habits, fatigue, or chest pain.     REVIEW OF SYSTEMS:     Remainder of a 10 system review of systems is negative.     ALLERGIES:  has No Known Allergies.    MEDICATIONS: Reviewed in EPIC    MEDICAL HISTORY:  Past Medical History:   Diagnosis Date   ??? Congenital vascular abnormality    ??? COPD (chronic obstructive pulmonary disease) (CMS-HCC)    ??? Impotence of organic origin    ??? Inguinal hernia without mention of obstruction or gangrene, unilateral or unspecified, (not specified as recurrent)    ??? Malignant neoplasm of prostate (CMS-HCC)     01/16/2006 Gleason's 7 ( 4  3) right face, right mid medial, right mid lateral in left mid lateral high-grade PIN.  PSA 5.7 with 6% free PSA    ??? 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 Unspecified congenital anomaly of urinary system    ??? Unspecified hemorrhoids without mention of complication        SURGICAL HISTORY:  Past Surgical History:   Procedure Laterality Date   ??? HEMORRHOID SURGERY     ??? PROSTATE CRYOABLATION      03/23/2006 left nerve sparing   ??? Seminal Vessicle Biopsy      12/22/2009       SOCIAL HISTORY:    reports that he quit smoking about 3 weeks ago. His smoking use included cigars. He has a 50.00 pack-year smoking history. He has never used smokeless tobacco. He reports current alcohol use of about 19.0 standard drinks of alcohol per week. He reports previous drug use. Drug: Marijuana.    FAMILY HISTORY:  family history includes Cancer in his paternal uncle; Diabetes in his mother and sister; Heart disease in his brother and mother; Hypertension in his sister; Prostate cancer in his brother; Sarcoidosis in his son.    Objective :    Vital Signs for this encounter:  BSA: There is no height or weight on file to calculate BSA.  There were no vitals taken for this visit.  Pain Assessment:   of 10     PHYSICAL ASSESSMENT:     General:  Alert.  Oriented x 3.  In NAD  HEENT:   PERRL. Sclerae anicteric.   Neck:       Supple; trachea midline.  No significant thyroid enlargement or nodules.  Heart:      RRR; no murmur  Lungs      Normal respiratory effort;  CTAB; no rhonchi or wheeze.  Abdomen:   Soft, non-tender.  No masses.   MSK:     Extremities without clubbing, cyanosis or edema.   5/5 strength. Ambulates wo assistance.  Neuro:   Nonfocal.  Sensation grossly intact.   Psych:   Normal affect.  Judgement and insight seem appropriate  Skin:     Skin color, texture, turgor normal, no rashes or lesions.??    DIAGNOSTIC STUDIES:  Surgical Pathology 08/31/2018  ?? DIAGNOSIS:  A. ??ESOPHAGUS, DISTAL; COLD BIOPSY:  - FRAGMENT OF ADENOCARCINOMA, SEE COMMENT.  - FRAGMENTS OF GASTRIC OXYNTIC MUCOSA WITH HEALING EROSIVE GASTRITIS.  - INTESTINAL METAPLASIA IS NOT IDENTIFIED.  - SQUAMOUS MUCOSA IS NOT PRESENT FOR EVALUATION.    B. ??STOMACH, ANTRUM; COLD BIOPSY:  - ANTRAL MUCOSA WITH HEALING EROSIVE GASTRITIS.  - NEGATIVE FOR H. PYLORI, DYSPLASIA, AND MALIGNANCY.    C. ??COLON POLYP, CECUM; COLD BIOPSY:  - TUBULAR ADENOMA.  - NEGATIVE FOR HIGH-GRADE DYSPLASIA AND MALIGNANCY.    D. ??COLON, TRANSVERSE; HOT SNARE:  - TUBULAR ADENOMA.  - NEGATIVE FOR HIGH-GRADE DYSPLASIA AND MALIGNANCY.    IMAGING:  ?? EGD 08/31/2018  ?? LA Grade C (one or more mucosal breaks continuous between tops of 2 or   more mucosal folds, less than 75% circumference) esophagitis with no   bleeding was found 40 cm from the incisors. Biopsies were taken with a   cold forceps for histology.  One non-bleeding cratered gastric ulcer with no stigmata of bleeding was   found in the gastric antrum. The lesion was 3 mm in largest dimension.  Diffuse and patchy mild inflammation characterized by erythema and   granularity was found in the gastric antrum. Biopsies were taken with a   cold forceps for histology.  The examined duodenum was normal.  ?? PET CT Skull 10/11/2018  ?? Circumferential thickening of the distal esophagus with increased FDG uptake, likely correlating to site of known malignancy.  ?? Focal uptake in the prostate to the right of midline likely represents prostate malignancy.  ?? Left para-aortic lymph nodes with mild FDG uptake, similar in size to prior MRI. These may be related to prostate cancer given low avidity.  ?? Redemonstrated multifocal osseous metastases; only the L1 vertebral body metastasis is metabolically active. Note that given low avidity, at least some of these may be related to prostate cancer rather than esophageal cancer.  ?? FDG uptake in the right posterior gluteal musculature, likely inflammatory; associated fluid collection on prior MRI has mostly resolved.    ASSESSMENT:   Brandon Olsen is a 73 y.o. male with a history of stage IV prostate cancer to bone and pelvic  nodes (now on Lupron and Xtandi with Xgeva, Gleason 7) with a new diagnosis of adenocarcinoma of the lower esophagus.     PLAN:  Patient was seen and examined today in conjunction with Dr. Lenise Arena. Imaging and pathology reviewed. We agree with Dr. Marigene Ehlers recommendation for EUS to determine stage and considering endoscopic resection if T1b. Patient and family verbalized their understanding of the plan and denies any further questions at this time.     Our contact information was given for any further questions or concerns.    Dr. Camillo Flaming, MD was present for the evaluation and is in agreement with the plan above.

## 2018-10-13 NOTE — Unmapped (Signed)
It was  medically necessary for me to see the patient in addition to the nurse practitioner. I discussed the findings, assessment and plan with the patient.      As noted, newly diagnosed distal esophageal cancer.  This appears to be early stage.  PET scan shows no obvious disease outside of the esophagus.  He was seen today in the multidisciplinary gastroesophageal cancer clinic.  Given the early stage, we have recommended endoscopic ultrasound to further evaluate this.  I will see him after that to discuss options for treatment.    Brandon Olsen. Lenise Arena, MD  [facility] Surgical [facility]  [address]  [address]  [place], [place]  [address]  Phone: [phone]  FAX: [phone]

## 2018-10-16 NOTE — Unmapped (Signed)
The patient's son Brandon Olsen has contacted the clinic regarding amendments needed to his FMLA request that was originally submitted on 10/08/2018.    ?? I have contacted and spoken with [name], the HR leave specialist from [facility] regarding the following:  ?? Now that the patient has been evaluated by the surgical oncology team, amendments can be made to the [facility] specific to the outlined plan of care.  ?? The FMLA documents have been revised and resubmitted via facsimile.  ?? The original version of the sent documents including the facsimile confirmation page are archived in the media section of the electronic medical record.    No other actions taken at this time.

## 2018-10-24 NOTE — Unmapped (Signed)
Patient contact the clinic this morning with regard to the following:    ?? Seeking a medication refill of his enzalutamide from the [facility].    ?? I have verified with Dr. Claude Manges at the plan of care includes continuing with the current dosing regimen of enzalutamide along with leuprolide.    ?? [employer] has been contacted and notified that it is appropriate to initiate the refill request for the patient's enzalutamide.  ?? Given the burden some co-pay amount of the medication, the support team from [facility] has submitted an application to qualify the patient for manufacturer assistance.  ?? A definitive outcome regarding the application approval or denial is being investigated by the dispensing pharmacy at this time.    ?? Otherwise, the patient remains tentatively scheduled to follow-up with Dr. Claude Manges on 11/26/2018 in conjunction with his next dose of leuprolide.    No other actions taken at this time and Dr. Claude Manges is aware.

## 2018-10-29 ENCOUNTER — Institutional Professional Consult (permissible substitution): Admit: 2018-10-29 | Discharge: 2018-10-30 | Payer: MEDICARE

## 2018-10-29 DIAGNOSIS — C7951 Secondary malignant neoplasm of bone: Secondary | ICD-10-CM

## 2018-10-29 DIAGNOSIS — C61 Malignant neoplasm of prostate: Principal | ICD-10-CM

## 2018-10-29 LAB — ALBUMIN: Albumin:MCnc:Pt:Ser/Plas:Qn:: 4.3

## 2018-10-29 LAB — CALCIUM: Calcium:MCnc:Pt:Ser/Plas:Qn:: 9.7

## 2018-10-29 LAB — CREATININE
CREATININE: 0.92 mg/dL (ref 0.70–1.30)
Creatinine:MCnc:Pt:Ser/Plas:Qn:: 0.92

## 2018-10-29 LAB — PHOSPHORUS: Phosphate:MCnc:Pt:Ser/Plas:Qn:: 3.4

## 2018-10-29 NOTE — Unmapped (Signed)
Hi,    Patient Brandon Olsen called requesting a medication refill for the following:    ? Medication: Xtandi  ? Dosage: 40 mg   ? Days left of medication: 5  ? Pharmacy: [facility]      The expected turnaround time is 3-4 business days       Check Indicates criteria has been reviewed and confirmed with the patient:    []  Preferred Name   []  DOB and/or MR#  []  Preferred Contact Method  []  Phone Number(s)   []  Preferred Pharmacy   []  MyChart     Thank you,  Kelli Hope   [facility]  [phone]

## 2018-10-29 NOTE — Unmapped (Signed)
The patient has been notified by the [facility] Medication Assistance team as outlined in the referral section of the electronic medical record.    ?? The patient and his wife both been advised they are to contact the pharmaceutical manufacturer for to [employer] at [phone] to coordinate procurement/delivery of the medication.    ?? I have spoken with Dario Ave from the pharmacy team who has confirmed speaking with the patient and his wife earlier today and that they are clear regarding the needed actions to schedule delivery.    No other actions taken at this time and Dr. Claude Manges is aware.

## 2018-10-30 NOTE — Unmapped (Signed)
02/18- pt will no longer be able to fill with [facility],they will have to fill @  Medication (Brand/Generic): Xtandi  Patient Assistance Program: [employer]  Patient ID: [id number]  Coverage Dates: 10/26/2018 through 09/12/2019

## 2018-10-30 NOTE — Unmapped (Signed)
Labs completed via phlebotomy/lab.     Pt received Xgeva injection in left deltoid - pt tolerated well.    AVS received at scheduling.    Pt stable and ambulated independently from clinic.    Pt continues to ask questions about getting his medicine from manufacturer. I wrote the number down for him and reinforced what he needed to do.

## 2018-10-31 ENCOUNTER — Encounter
Admit: 2018-10-31 | Discharge: 2018-10-31 | Payer: MEDICARE | Attending: Certified Registered" | Primary: Certified Registered"

## 2018-10-31 ENCOUNTER — Ambulatory Visit: Admit: 2018-10-31 | Discharge: 2018-10-31 | Payer: MEDICARE | Attending: Surgery | Primary: Surgery

## 2018-10-31 ENCOUNTER — Ambulatory Visit: Admit: 2018-10-31 | Discharge: 2018-10-31 | Payer: MEDICARE

## 2018-10-31 DIAGNOSIS — C155 Malignant neoplasm of lower third of esophagus: Principal | ICD-10-CM

## 2018-10-31 DIAGNOSIS — C159 Malignant neoplasm of esophagus, unspecified: Principal | ICD-10-CM

## 2018-11-07 ENCOUNTER — Ambulatory Visit
Admit: 2018-11-07 | Discharge: 2018-11-08 | Payer: MEDICARE | Attending: Hematology & Oncology | Primary: Hematology & Oncology

## 2018-11-07 DIAGNOSIS — C159 Malignant neoplasm of esophagus, unspecified: Principal | ICD-10-CM

## 2018-11-07 MED ORDER — ONDANSETRON HCL 8 MG TABLET
ORAL_TABLET | Freq: Three times a day (TID) | ORAL | prn refills | 0.00000 days | Status: CP | PRN
Start: 2018-11-07 — End: ?

## 2018-11-07 MED ORDER — PROCHLORPERAZINE MALEATE 10 MG TABLET
ORAL_TABLET | Freq: Four times a day (QID) | ORAL | prn refills | 0 days | Status: CP | PRN
Start: 2018-11-07 — End: ?

## 2018-11-13 ENCOUNTER — Ambulatory Visit: Admit: 2018-11-13 | Discharge: 2018-11-13 | Payer: MEDICARE

## 2018-11-13 DIAGNOSIS — C169 Malignant neoplasm of stomach, unspecified: Principal | ICD-10-CM

## 2018-11-13 DIAGNOSIS — J449 Chronic obstructive pulmonary disease, unspecified: Principal | ICD-10-CM

## 2018-11-13 DIAGNOSIS — C61 Malignant neoplasm of prostate: Principal | ICD-10-CM

## 2018-11-13 DIAGNOSIS — C159 Malignant neoplasm of esophagus, unspecified: Principal | ICD-10-CM

## 2018-11-14 ENCOUNTER — Ambulatory Visit
Admit: 2018-11-14 | Discharge: 2018-12-11 | Payer: MEDICARE | Attending: Hematology & Oncology | Primary: Hematology & Oncology

## 2018-11-14 ENCOUNTER — Ambulatory Visit: Admit: 2018-11-14 | Discharge: 2018-12-11 | Payer: MEDICARE

## 2018-11-14 ENCOUNTER — Ambulatory Visit
Admit: 2018-11-14 | Discharge: 2018-12-11 | Payer: MEDICARE | Attending: Radiation Oncology | Primary: Radiation Oncology

## 2018-11-14 ENCOUNTER — Encounter
Admit: 2018-11-14 | Discharge: 2018-12-11 | Payer: MEDICARE | Attending: Radiation Oncology | Primary: Radiation Oncology

## 2018-11-14 DIAGNOSIS — Z833 Family history of diabetes mellitus: Principal | ICD-10-CM

## 2018-11-14 DIAGNOSIS — C61 Malignant neoplasm of prostate: Principal | ICD-10-CM

## 2018-11-14 DIAGNOSIS — Z7951 Long term (current) use of inhaled steroids: Principal | ICD-10-CM

## 2018-11-14 DIAGNOSIS — Z79899 Other long term (current) drug therapy: Principal | ICD-10-CM

## 2018-11-14 DIAGNOSIS — C7952 Secondary malignant neoplasm of bone marrow: Principal | ICD-10-CM

## 2018-11-14 DIAGNOSIS — C160 Malignant neoplasm of cardia: Principal | ICD-10-CM

## 2018-11-14 DIAGNOSIS — J449 Chronic obstructive pulmonary disease, unspecified: Principal | ICD-10-CM

## 2018-11-14 DIAGNOSIS — Z87891 Personal history of nicotine dependence: Principal | ICD-10-CM

## 2018-11-14 DIAGNOSIS — N529 Male erectile dysfunction, unspecified: Principal | ICD-10-CM

## 2018-11-14 DIAGNOSIS — M170 Bilateral primary osteoarthritis of knee: Principal | ICD-10-CM

## 2018-11-14 DIAGNOSIS — C775 Secondary and unspecified malignant neoplasm of intrapelvic lymph nodes: Principal | ICD-10-CM

## 2018-11-14 DIAGNOSIS — Z8249 Family history of ischemic heart disease and other diseases of the circulatory system: Principal | ICD-10-CM

## 2018-11-14 DIAGNOSIS — Z791 Long term (current) use of non-steroidal anti-inflammatories (NSAID): Principal | ICD-10-CM

## 2018-11-14 DIAGNOSIS — C7951 Secondary malignant neoplasm of bone: Principal | ICD-10-CM

## 2018-11-14 DIAGNOSIS — K649 Unspecified hemorrhoids: Principal | ICD-10-CM

## 2018-11-14 DIAGNOSIS — Z510 Encounter for antineoplastic radiation therapy: Principal | ICD-10-CM

## 2018-11-14 DIAGNOSIS — Z8042 Family history of malignant neoplasm of prostate: Principal | ICD-10-CM

## 2018-11-14 DIAGNOSIS — Z832 Family history of diseases of the blood and blood-forming organs and certain disorders involving the immune mechanism: Principal | ICD-10-CM

## 2018-11-14 DIAGNOSIS — C159 Malignant neoplasm of esophagus, unspecified: Principal | ICD-10-CM

## 2018-11-14 DIAGNOSIS — Z5111 Encounter for antineoplastic chemotherapy: Principal | ICD-10-CM

## 2018-11-14 DIAGNOSIS — K4090 Unilateral inguinal hernia, without obstruction or gangrene, not specified as recurrent: Principal | ICD-10-CM

## 2018-11-14 DIAGNOSIS — Q279 Congenital malformation of peripheral vascular system, unspecified: Principal | ICD-10-CM

## 2018-11-14 DIAGNOSIS — Q2549 Other congenital malformations of aorta: Principal | ICD-10-CM

## 2018-11-14 DIAGNOSIS — Z7982 Long term (current) use of aspirin: Principal | ICD-10-CM

## 2018-11-14 DIAGNOSIS — Q649 Congenital malformation of urinary system, unspecified: Principal | ICD-10-CM

## 2018-11-15 ENCOUNTER — Ambulatory Visit: Admit: 2018-11-15 | Discharge: 2018-12-11 | Payer: MEDICARE

## 2018-11-26 DIAGNOSIS — C7951 Secondary malignant neoplasm of bone: Principal | ICD-10-CM

## 2018-11-26 DIAGNOSIS — Z832 Family history of diseases of the blood and blood-forming organs and certain disorders involving the immune mechanism: Principal | ICD-10-CM

## 2018-11-26 DIAGNOSIS — Z791 Long term (current) use of non-steroidal anti-inflammatories (NSAID): Principal | ICD-10-CM

## 2018-11-26 DIAGNOSIS — C775 Secondary and unspecified malignant neoplasm of intrapelvic lymph nodes: Principal | ICD-10-CM

## 2018-11-26 DIAGNOSIS — Z7982 Long term (current) use of aspirin: Principal | ICD-10-CM

## 2018-11-26 DIAGNOSIS — Z8042 Family history of malignant neoplasm of prostate: Principal | ICD-10-CM

## 2018-11-26 DIAGNOSIS — Z87891 Personal history of nicotine dependence: Principal | ICD-10-CM

## 2018-11-26 DIAGNOSIS — Z5111 Encounter for antineoplastic chemotherapy: Principal | ICD-10-CM

## 2018-11-26 DIAGNOSIS — Z79899 Other long term (current) drug therapy: Principal | ICD-10-CM

## 2018-11-26 DIAGNOSIS — Z8249 Family history of ischemic heart disease and other diseases of the circulatory system: Principal | ICD-10-CM

## 2018-11-26 DIAGNOSIS — J449 Chronic obstructive pulmonary disease, unspecified: Principal | ICD-10-CM

## 2018-11-26 DIAGNOSIS — Z833 Family history of diabetes mellitus: Principal | ICD-10-CM

## 2018-11-26 DIAGNOSIS — M170 Bilateral primary osteoarthritis of knee: Principal | ICD-10-CM

## 2018-11-26 DIAGNOSIS — Z7951 Long term (current) use of inhaled steroids: Principal | ICD-10-CM

## 2018-11-26 DIAGNOSIS — C159 Malignant neoplasm of esophagus, unspecified: Principal | ICD-10-CM

## 2018-11-26 DIAGNOSIS — Z510 Encounter for antineoplastic radiation therapy: Principal | ICD-10-CM

## 2018-11-26 DIAGNOSIS — C61 Malignant neoplasm of prostate: Principal | ICD-10-CM

## 2018-11-26 DIAGNOSIS — C160 Malignant neoplasm of cardia: Principal | ICD-10-CM

## 2018-12-03 DIAGNOSIS — M170 Bilateral primary osteoarthritis of knee: Principal | ICD-10-CM

## 2018-12-03 DIAGNOSIS — C160 Malignant neoplasm of cardia: Principal | ICD-10-CM

## 2018-12-03 DIAGNOSIS — Z510 Encounter for antineoplastic radiation therapy: Principal | ICD-10-CM

## 2018-12-03 DIAGNOSIS — C61 Malignant neoplasm of prostate: Principal | ICD-10-CM

## 2018-12-03 DIAGNOSIS — Z7982 Long term (current) use of aspirin: Principal | ICD-10-CM

## 2018-12-03 DIAGNOSIS — J449 Chronic obstructive pulmonary disease, unspecified: Principal | ICD-10-CM

## 2018-12-03 DIAGNOSIS — Z79899 Other long term (current) drug therapy: Principal | ICD-10-CM

## 2018-12-03 DIAGNOSIS — Z87891 Personal history of nicotine dependence: Principal | ICD-10-CM

## 2018-12-03 DIAGNOSIS — Z7951 Long term (current) use of inhaled steroids: Principal | ICD-10-CM

## 2018-12-03 DIAGNOSIS — Z5111 Encounter for antineoplastic chemotherapy: Principal | ICD-10-CM

## 2018-12-03 DIAGNOSIS — Z832 Family history of diseases of the blood and blood-forming organs and certain disorders involving the immune mechanism: Principal | ICD-10-CM

## 2018-12-03 DIAGNOSIS — Z833 Family history of diabetes mellitus: Principal | ICD-10-CM

## 2018-12-03 DIAGNOSIS — Z8249 Family history of ischemic heart disease and other diseases of the circulatory system: Principal | ICD-10-CM

## 2018-12-03 DIAGNOSIS — C7951 Secondary malignant neoplasm of bone: Principal | ICD-10-CM

## 2018-12-03 DIAGNOSIS — C159 Malignant neoplasm of esophagus, unspecified: Principal | ICD-10-CM

## 2018-12-03 DIAGNOSIS — Z791 Long term (current) use of non-steroidal anti-inflammatories (NSAID): Principal | ICD-10-CM

## 2018-12-03 DIAGNOSIS — C775 Secondary and unspecified malignant neoplasm of intrapelvic lymph nodes: Principal | ICD-10-CM

## 2018-12-03 DIAGNOSIS — Z8042 Family history of malignant neoplasm of prostate: Principal | ICD-10-CM

## 2018-12-10 ENCOUNTER — Encounter
Admit: 2018-12-10 | Discharge: 2018-12-11 | Payer: MEDICARE | Attending: Radiation Oncology | Primary: Radiation Oncology

## 2018-12-10 DIAGNOSIS — Z832 Family history of diseases of the blood and blood-forming organs and certain disorders involving the immune mechanism: Principal | ICD-10-CM

## 2018-12-10 DIAGNOSIS — C160 Malignant neoplasm of cardia: Principal | ICD-10-CM

## 2018-12-10 DIAGNOSIS — Z5111 Encounter for antineoplastic chemotherapy: Principal | ICD-10-CM

## 2018-12-10 DIAGNOSIS — Z7982 Long term (current) use of aspirin: Principal | ICD-10-CM

## 2018-12-10 DIAGNOSIS — Z8042 Family history of malignant neoplasm of prostate: Principal | ICD-10-CM

## 2018-12-10 DIAGNOSIS — C775 Secondary and unspecified malignant neoplasm of intrapelvic lymph nodes: Principal | ICD-10-CM

## 2018-12-10 DIAGNOSIS — Z7951 Long term (current) use of inhaled steroids: Principal | ICD-10-CM

## 2018-12-10 DIAGNOSIS — Z833 Family history of diabetes mellitus: Principal | ICD-10-CM

## 2018-12-10 DIAGNOSIS — Z510 Encounter for antineoplastic radiation therapy: Principal | ICD-10-CM

## 2018-12-10 DIAGNOSIS — M170 Bilateral primary osteoarthritis of knee: Principal | ICD-10-CM

## 2018-12-10 DIAGNOSIS — J449 Chronic obstructive pulmonary disease, unspecified: Principal | ICD-10-CM

## 2018-12-10 DIAGNOSIS — Z791 Long term (current) use of non-steroidal anti-inflammatories (NSAID): Principal | ICD-10-CM

## 2018-12-10 DIAGNOSIS — C159 Malignant neoplasm of esophagus, unspecified: Principal | ICD-10-CM

## 2018-12-10 DIAGNOSIS — Z8249 Family history of ischemic heart disease and other diseases of the circulatory system: Principal | ICD-10-CM

## 2018-12-10 DIAGNOSIS — C7951 Secondary malignant neoplasm of bone: Principal | ICD-10-CM

## 2018-12-10 DIAGNOSIS — Z79899 Other long term (current) drug therapy: Principal | ICD-10-CM

## 2018-12-10 DIAGNOSIS — Z87891 Personal history of nicotine dependence: Principal | ICD-10-CM

## 2018-12-10 DIAGNOSIS — C61 Malignant neoplasm of prostate: Principal | ICD-10-CM

## 2018-12-12 ENCOUNTER — Ambulatory Visit: Admit: 2018-12-12 | Discharge: 2019-01-10 | Payer: MEDICARE

## 2018-12-12 ENCOUNTER — Encounter
Admit: 2018-12-12 | Discharge: 2019-01-10 | Payer: MEDICARE | Attending: Radiation Oncology | Primary: Radiation Oncology

## 2018-12-12 ENCOUNTER — Ambulatory Visit
Admit: 2018-12-12 | Discharge: 2019-01-10 | Payer: MEDICARE | Attending: Hematology & Oncology | Primary: Hematology & Oncology

## 2018-12-13 DIAGNOSIS — C159 Malignant neoplasm of esophagus, unspecified: Secondary | ICD-10-CM

## 2018-12-13 DIAGNOSIS — Z5111 Encounter for antineoplastic chemotherapy: Secondary | ICD-10-CM

## 2018-12-13 DIAGNOSIS — C160 Malignant neoplasm of cardia: Principal | ICD-10-CM

## 2018-12-17 DIAGNOSIS — C159 Malignant neoplasm of esophagus, unspecified: Principal | ICD-10-CM

## 2018-12-24 DIAGNOSIS — C61 Malignant neoplasm of prostate: Secondary | ICD-10-CM

## 2018-12-24 DIAGNOSIS — C159 Malignant neoplasm of esophagus, unspecified: Principal | ICD-10-CM

## 2018-12-27 DIAGNOSIS — L089 Local infection of the skin and subcutaneous tissue, unspecified: Principal | ICD-10-CM

## 2018-12-27 DIAGNOSIS — C159 Malignant neoplasm of esophagus, unspecified: Secondary | ICD-10-CM

## 2018-12-27 MED ORDER — DOXYCYCLINE HYCLATE 100 MG CAPSULE
ORAL_CAPSULE | Freq: Two times a day (BID) | ORAL | 0 refills | 0 days | Status: CP
Start: 2018-12-27 — End: 2019-01-28

## 2018-12-31 DIAGNOSIS — C159 Malignant neoplasm of esophagus, unspecified: Principal | ICD-10-CM

## 2019-01-28 ENCOUNTER — Ambulatory Visit: Admit: 2019-01-28 | Discharge: 2019-01-28 | Payer: MEDICARE

## 2019-01-28 ENCOUNTER — Ambulatory Visit
Admit: 2019-01-28 | Discharge: 2019-01-28 | Payer: MEDICARE | Attending: Hematology & Oncology | Primary: Hematology & Oncology

## 2019-01-28 DIAGNOSIS — C61 Malignant neoplasm of prostate: Secondary | ICD-10-CM

## 2019-01-28 DIAGNOSIS — C159 Malignant neoplasm of esophagus, unspecified: Principal | ICD-10-CM

## 2019-02-16 ENCOUNTER — Ambulatory Visit: Admit: 2019-02-16 | Discharge: 2019-02-17 | Payer: MEDICARE

## 2019-02-16 DIAGNOSIS — Z1159 Encounter for screening for other viral diseases: Principal | ICD-10-CM

## 2019-02-23 ENCOUNTER — Ambulatory Visit: Admit: 2019-02-23 | Discharge: 2019-02-24 | Payer: MEDICARE

## 2019-02-23 DIAGNOSIS — Z1159 Encounter for screening for other viral diseases: Principal | ICD-10-CM

## 2019-02-26 ENCOUNTER — Encounter: Admit: 2019-02-26 | Discharge: 2019-02-26 | Payer: MEDICARE | Attending: Registered Nurse | Primary: Registered Nurse

## 2019-02-26 ENCOUNTER — Ambulatory Visit: Admit: 2019-02-26 | Discharge: 2019-02-26 | Payer: MEDICARE

## 2019-02-26 DIAGNOSIS — K2210 Ulcer of esophagus without bleeding: Principal | ICD-10-CM

## 2019-03-06 ENCOUNTER — Ambulatory Visit
Admit: 2019-03-06 | Discharge: 2019-03-07 | Payer: MEDICARE | Attending: Hematology & Oncology | Primary: Hematology & Oncology

## 2019-03-06 DIAGNOSIS — C159 Malignant neoplasm of esophagus, unspecified: Principal | ICD-10-CM

## 2019-03-06 DIAGNOSIS — C61 Malignant neoplasm of prostate: Secondary | ICD-10-CM

## 2019-04-13 ENCOUNTER — Ambulatory Visit: Admit: 2019-04-13 | Discharge: 2019-04-14 | Payer: MEDICARE | Attending: Family | Primary: Family

## 2019-04-13 DIAGNOSIS — Z1159 Encounter for screening for other viral diseases: Principal | ICD-10-CM

## 2019-04-16 ENCOUNTER — Encounter: Admit: 2019-04-16 | Discharge: 2019-04-16 | Payer: MEDICARE | Attending: Registered Nurse | Primary: Registered Nurse

## 2019-04-16 ENCOUNTER — Ambulatory Visit: Admit: 2019-04-16 | Discharge: 2019-04-16 | Payer: MEDICARE

## 2019-04-16 DIAGNOSIS — Z08 Encounter for follow-up examination after completed treatment for malignant neoplasm: Principal | ICD-10-CM

## 2019-04-22 ENCOUNTER — Institutional Professional Consult (permissible substitution): Admit: 2019-04-22 | Discharge: 2019-04-22 | Payer: MEDICARE

## 2019-04-22 ENCOUNTER — Ambulatory Visit: Admit: 2019-04-22 | Discharge: 2019-04-22 | Payer: MEDICARE

## 2019-04-22 ENCOUNTER — Ambulatory Visit
Admit: 2019-04-22 | Discharge: 2019-04-22 | Payer: MEDICARE | Attending: Hematology & Oncology | Primary: Hematology & Oncology

## 2019-04-22 DIAGNOSIS — C61 Malignant neoplasm of prostate: Principal | ICD-10-CM

## 2019-04-22 DIAGNOSIS — C159 Malignant neoplasm of esophagus, unspecified: Principal | ICD-10-CM

## 2019-05-02 ENCOUNTER — Ambulatory Visit
Admit: 2019-05-02 | Discharge: 2019-05-13 | Payer: MEDICARE | Attending: Radiation Oncology | Primary: Radiation Oncology

## 2019-05-02 DIAGNOSIS — C159 Malignant neoplasm of esophagus, unspecified: Principal | ICD-10-CM

## 2019-05-21 ENCOUNTER — Ambulatory Visit: Admit: 2019-05-21 | Discharge: 2019-05-21 | Payer: MEDICARE

## 2019-05-21 ENCOUNTER — Institutional Professional Consult (permissible substitution): Admit: 2019-05-21 | Discharge: 2019-05-21 | Payer: MEDICARE

## 2019-05-21 ENCOUNTER — Ambulatory Visit
Admit: 2019-05-21 | Discharge: 2019-05-21 | Payer: MEDICARE | Attending: Hematology & Oncology | Primary: Hematology & Oncology

## 2019-05-21 DIAGNOSIS — C159 Malignant neoplasm of esophagus, unspecified: Secondary | ICD-10-CM

## 2019-05-21 DIAGNOSIS — C61 Malignant neoplasm of prostate: Secondary | ICD-10-CM

## 2019-05-21 DIAGNOSIS — Z7982 Long term (current) use of aspirin: Secondary | ICD-10-CM

## 2019-05-21 DIAGNOSIS — Z5112 Encounter for antineoplastic immunotherapy: Secondary | ICD-10-CM

## 2019-05-21 DIAGNOSIS — Z8042 Family history of malignant neoplasm of prostate: Secondary | ICD-10-CM

## 2019-05-21 DIAGNOSIS — Z87891 Personal history of nicotine dependence: Secondary | ICD-10-CM

## 2019-05-21 DIAGNOSIS — J449 Chronic obstructive pulmonary disease, unspecified: Secondary | ICD-10-CM

## 2019-05-21 MED ORDER — PREDNISONE 5 MG TABLET
ORAL_TABLET | Freq: Two times a day (BID) | ORAL | 11 refills | 30 days | Status: CP
Start: 2019-05-21 — End: ?

## 2019-05-21 MED ORDER — ABIRATERONE 250 MG TABLET
ORAL_TABLET | Freq: Every day | ORAL | 11 refills | 30.00000 days | Status: CP
Start: 2019-05-21 — End: ?

## 2019-05-22 DIAGNOSIS — C61 Malignant neoplasm of prostate: Secondary | ICD-10-CM

## 2019-06-26 ENCOUNTER — Ambulatory Visit: Admit: 2019-06-26 | Discharge: 2019-06-26 | Payer: MEDICARE

## 2019-06-26 ENCOUNTER — Institutional Professional Consult (permissible substitution): Admit: 2019-06-26 | Discharge: 2019-06-26 | Payer: MEDICARE

## 2019-06-26 ENCOUNTER — Ambulatory Visit
Admit: 2019-06-26 | Discharge: 2019-06-26 | Payer: MEDICARE | Attending: Hematology & Oncology | Primary: Hematology & Oncology

## 2019-06-26 DIAGNOSIS — C61 Malignant neoplasm of prostate: Principal | ICD-10-CM

## 2019-07-03 DIAGNOSIS — C61 Malignant neoplasm of prostate: Principal | ICD-10-CM

## 2019-07-03 MED ORDER — PREDNISONE 5 MG TABLET: 5 mg | tablet | Freq: Two times a day (BID) | 11 refills | 30 days | Status: AC

## 2019-07-12 ENCOUNTER — Ambulatory Visit: Admit: 2019-07-12 | Discharge: 2019-07-13 | Payer: MEDICARE | Attending: Family | Primary: Family

## 2019-07-15 ENCOUNTER — Ambulatory Visit: Admit: 2019-07-15 | Discharge: 2019-07-15 | Payer: MEDICARE

## 2019-07-15 ENCOUNTER — Encounter: Admit: 2019-07-15 | Discharge: 2019-07-15 | Payer: MEDICARE | Attending: Anesthesiology | Primary: Anesthesiology

## 2019-07-31 ENCOUNTER — Institutional Professional Consult (permissible substitution): Admit: 2019-07-31 | Discharge: 2019-08-01 | Payer: MEDICARE

## 2019-07-31 ENCOUNTER — Ambulatory Visit
Admit: 2019-07-31 | Discharge: 2019-08-01 | Payer: MEDICARE | Attending: Hematology & Oncology | Primary: Hematology & Oncology

## 2019-07-31 DIAGNOSIS — C159 Malignant neoplasm of esophagus, unspecified: Principal | ICD-10-CM

## 2019-07-31 DIAGNOSIS — C61 Malignant neoplasm of prostate: Principal | ICD-10-CM

## 2019-07-31 MED ORDER — FAMOTIDINE 40 MG TABLET
ORAL_TABLET | Freq: Every evening | ORAL | 11 refills | 30 days | Status: CP
Start: 2019-07-31 — End: 2020-07-30

## 2019-08-07 DIAGNOSIS — N3941 Urge incontinence: Principal | ICD-10-CM

## 2019-08-07 MED ORDER — TAMSULOSIN 0.4 MG CAPSULE
ORAL_CAPSULE | 0 refills | 0 days | Status: CP
Start: 2019-08-07 — End: ?

## 2019-08-28 ENCOUNTER — Ambulatory Visit
Admit: 2019-08-28 | Discharge: 2019-08-28 | Payer: MEDICARE | Attending: Hematology & Oncology | Primary: Hematology & Oncology

## 2019-08-28 ENCOUNTER — Institutional Professional Consult (permissible substitution): Admit: 2019-08-28 | Discharge: 2019-08-28 | Payer: MEDICARE

## 2019-08-28 DIAGNOSIS — C61 Malignant neoplasm of prostate: Principal | ICD-10-CM

## 2019-08-28 DIAGNOSIS — C159 Malignant neoplasm of esophagus, unspecified: Principal | ICD-10-CM

## 2019-08-28 MED ORDER — FUROSEMIDE 40 MG TABLET
ORAL_TABLET | Freq: Every day | ORAL | 11 refills | 30.00000 days | Status: CP
Start: 2019-08-28 — End: 2020-08-27

## 2019-10-29 ENCOUNTER — Institutional Professional Consult (permissible substitution): Admit: 2019-10-29 | Discharge: 2019-10-29 | Payer: MEDICARE

## 2019-10-29 DIAGNOSIS — C61 Malignant neoplasm of prostate: Principal | ICD-10-CM

## 2019-10-29 DIAGNOSIS — C159 Malignant neoplasm of esophagus, unspecified: Principal | ICD-10-CM

## 2019-10-30 DIAGNOSIS — C159 Malignant neoplasm of esophagus, unspecified: Principal | ICD-10-CM

## 2019-10-30 DIAGNOSIS — C61 Malignant neoplasm of prostate: Principal | ICD-10-CM

## 2019-11-02 ENCOUNTER — Ambulatory Visit: Admit: 2019-11-02 | Discharge: 2019-11-03 | Payer: MEDICARE

## 2019-11-06 ENCOUNTER — Ambulatory Visit: Admit: 2019-11-06 | Discharge: 2019-11-19 | Payer: MEDICARE

## 2019-11-06 ENCOUNTER — Ambulatory Visit
Admit: 2019-11-06 | Discharge: 2019-11-19 | Payer: MEDICARE | Attending: Hematology & Oncology | Primary: Hematology & Oncology

## 2019-11-06 ENCOUNTER — Ambulatory Visit
Admit: 2019-11-06 | Discharge: 2019-11-10 | Payer: MEDICARE | Attending: Radiation Oncology | Primary: Radiation Oncology

## 2019-11-06 DIAGNOSIS — C159 Malignant neoplasm of esophagus, unspecified: Principal | ICD-10-CM

## 2019-11-10 ENCOUNTER — Ambulatory Visit: Payer: Medicare Other

## 2019-11-13 MED ORDER — PROCHLORPERAZINE MALEATE 10 MG TABLET
ORAL_TABLET | Freq: Four times a day (QID) | ORAL | prn refills | 8 days | Status: CP | PRN
Start: 2019-11-13 — End: ?

## 2019-11-13 MED ORDER — DEXAMETHASONE 4 MG TABLET
ORAL_TABLET | ORAL | 6 refills | 42 days | Status: CP
Start: 2019-11-13 — End: ?

## 2019-11-13 MED ORDER — PREDNISONE 5 MG TABLET
ORAL_TABLET | Freq: Two times a day (BID) | ORAL | 6 refills | 30 days | Status: CP
Start: 2019-11-13 — End: ?

## 2019-11-15 MED ORDER — IBUPROFEN 800 MG TABLET
ORAL_TABLET | 0 refills | 0 days | Status: CP
Start: 2019-11-15 — End: ?

## 2019-11-20 DIAGNOSIS — N3941 Urge incontinence: Principal | ICD-10-CM

## 2019-11-22 MED ORDER — TAMSULOSIN 0.4 MG CAPSULE
ORAL_CAPSULE | 0 refills | 0 days | Status: CP
Start: 2019-11-22 — End: ?

## 2019-11-26 ENCOUNTER — Institutional Professional Consult (permissible substitution): Admit: 2019-11-26 | Discharge: 2019-11-26 | Payer: MEDICARE

## 2019-11-26 ENCOUNTER — Ambulatory Visit
Admit: 2019-11-26 | Discharge: 2019-11-26 | Payer: MEDICARE | Attending: Hematology & Oncology | Primary: Hematology & Oncology

## 2019-11-26 ENCOUNTER — Ambulatory Visit: Admit: 2019-11-26 | Discharge: 2019-11-26 | Payer: MEDICARE

## 2019-11-26 DIAGNOSIS — C159 Malignant neoplasm of esophagus, unspecified: Principal | ICD-10-CM

## 2019-11-26 DIAGNOSIS — C61 Malignant neoplasm of prostate: Principal | ICD-10-CM

## 2019-11-26 DIAGNOSIS — C7951 Secondary malignant neoplasm of bone: Principal | ICD-10-CM

## 2019-12-10 ENCOUNTER — Other Ambulatory Visit: Admit: 2019-12-10 | Discharge: 2019-12-10 | Payer: MEDICARE

## 2019-12-10 ENCOUNTER — Ambulatory Visit
Admit: 2019-12-10 | Discharge: 2019-12-10 | Payer: MEDICARE | Attending: Hematology & Oncology | Primary: Hematology & Oncology

## 2019-12-10 DIAGNOSIS — C159 Malignant neoplasm of esophagus, unspecified: Principal | ICD-10-CM

## 2019-12-10 DIAGNOSIS — C7951 Secondary malignant neoplasm of bone: Principal | ICD-10-CM

## 2019-12-10 DIAGNOSIS — C61 Malignant neoplasm of prostate: Principal | ICD-10-CM

## 2019-12-31 ENCOUNTER — Institutional Professional Consult (permissible substitution)
Admit: 2019-12-31 | Discharge: 2019-12-31 | Payer: MEDICARE | Attending: Hematology & Oncology | Primary: Hematology & Oncology

## 2019-12-31 DIAGNOSIS — C7951 Secondary malignant neoplasm of bone: Principal | ICD-10-CM

## 2019-12-31 DIAGNOSIS — C61 Malignant neoplasm of prostate: Principal | ICD-10-CM

## 2019-12-31 DIAGNOSIS — C159 Malignant neoplasm of esophagus, unspecified: Principal | ICD-10-CM

## 2019-12-31 MED ORDER — DAROLUTAMIDE 300 MG TABLET
ORAL_TABLET | Freq: Two times a day (BID) | ORAL | 11 refills | 30 days | Status: CP
Start: 2019-12-31 — End: ?
  Filled 2020-01-10: qty 120, 30d supply, fill #0

## 2020-01-08 NOTE — Unmapped (Signed)
[facility] Specialty Medication Onboarding    Specialty Medication: NUBEQA  Prior Authorization: Approved   Financial Assistance: Yes - grant approved as secondary   Final Copay/Day Supply: $0 / 30 DAYS    Insurance Restrictions: Yes - max 1 month supply     Notes to Pharmacist:     The triage team has completed the benefits investigation and has determined that the patient is able to fill this medication at [facility]. Please contact the patient to complete the onboarding or follow up with the prescribing physician as needed.

## 2020-01-09 NOTE — Unmapped (Signed)
[facility]   Patient Onboarding/Medication Counseling    Brandon Olsen is aware to stop Xtandi when starting [place]  Will not be taking both meds at same time.    Brandon Olsen is a 74 y.o. male with prostate cancer who I am counseling today on initiation of therapy.  I am speaking to the patient.    Was a Nurse, learning disability used for this call? No    Verified patient's date of birth / HIPAA.    Specialty medication(s) to be sent: Hematology/Oncology: Nubeqa      Non-specialty medications/supplies to be sent: none      Medications not needed at this time: none       Nubeqa (Darolutamide)    Medication & Administration     Dosage: Take 2 tablets (600 mg total) by mouth 2 (two) times a day with meals. Take with food. Swallow tablets whole    Administration:   ??? Take with food  ??? Swallow whole  ??? Take with calcium and vitamin D    Adherence/Missed dose instructions:  ??? Take a missed dose as soon as you think about it and go back to your normal time.  ??? Do not take 2 doses at the same time or extra doses.    Goals of Therapy     ??? Prevent prostate cancer progression    Side Effects & Monitoring Parameters     ??? Common side effects  ??? Fatigue, loss of strength or energy  ??? Pain in arms or legs  ??? Rash    ??? The following side effects should be reported to the provider:  ??? Allergic reaction  ??? Infections (fever, chills, cough, sore throat)    Contraindications, Warnings, & Precautions     ??? Bone marrow suppression  ??? Hepatic impairment - patients with moderate hepatic impairment have increased exposure to the drug. A reduced dose is recommended   ??? Renal impairment  - patients with severe renal impairment who are not receiving hemodialysis have increased exposure to the drug. A reduced dose is recommended.  ??? Males with male partners of reproductive potential should use effective contraception during treatment and for 1 week after the last dose.    Drug/Food Interactions     ??? Medication list reviewed in Epic. The patient was instructed to inform the care team before taking any new medications or supplements. No drug interactions identified. - Recommend not taking Saw Palmetto and any additional herbal products while on this medication.    Storage, Handling Precautions, & Disposal     ??? Store room temperature  ??? Keep away from children and pets  ??? This drug is considered hazardous and should be handled as little as possible.  If someone else helps with medication administration, they should wear gloves.    Current Medications (including OTC/herbals), Comorbidities and Allergies     Current Outpatient Medications   Medication Sig Dispense Refill   ??? ADVAIR DISKUS 250-50 mcg/dose diskus      ??? albuterol (VENTOLIN HFA) 90 mcg/actuation inhaler Inhale.     ??? ascorbic acid (VITAMIN C) 1000 MG tablet Take 1,000 mg by mouth daily.     ??? aspirin (ECOTRIN) 81 MG tablet Take 81 mg by mouth. Frequency:QD   Dosage:81   MG  Instructions:  Note:Dose: 81MG      ??? calcium carbonate (OS-CAL) 1,250 mg (500 mg elem calcium) tablet Take 1 tablet by mouth. (Patient not taking: Reported on 12/31/2019)     ???  cannabidiol, CBD, extract 100 mg/mL Soln Take by mouth daily at 10am.     ??? cholecalciferol, vitamin D3, 1,000 unit tablet Take by mouth.     ??? cod liver oil Oil Take by mouth.     ??? darolutamide (NUBEQA) 300 mg tablet Take 2 tablets (600 mg total) by mouth 2 (two) times a day with meals. Take with food. Swallow tablets whole. 120 tablet 11   ??? famotidine (PEPCID) 40 MG tablet Take 1 tablet (40 mg total) by mouth nightly. 30 tablet 11   ??? furosemide (LASIX) 40 MG tablet Take 1 tablet (40 mg total) by mouth daily. As needed for swelling. 30 tablet 11   ??? garlic 1 mg cap Take by mouth.     ??? ginger, zingiber officinalis, (GINGER EXTRACT) 250 mg cap Take by mouth.     ??? glucosamine sulfate (GLUCOSAMINE) 500 mg Tab Take 2 tablets by mouth once daily.      ??? ibuprofen (MOTRIN) 800 MG tablet TAKE 1 TABLET BY MOUTH EVERY 8 HOURS AS NEEDED FOR PAIN 90 tablet 0   ??? LYCOPENE ORAL Take by mouth.     ??? multivitamin-Ca-iron-minerals Tab Take 1 tablet by mouth.     ??? NON FORMULARY Holy Basil     ??? nutritional supplement-fiber (QUINOA-KALE-HEMP) Liqd Take 1 Dose by mouth every other day. Hemp oil     ??? omeprazole (PRILOSEC) 40 MG capsule TAKE 1 CAPSULE BY MOUTH ONCE DAILY 30 MINUTES BEFORE BREAKFAST     ??? saw palmetto 160 mg cap Take 160 mg by mouth.     ??? tamsulosin (FLOMAX) 0.4 mg capsule Take 1 capsule by mouth once daily 90 capsule 0   ??? UNABLE TO FIND Med Name: Taking moringa 1,000 mg daily, Baobab po daily and Sour Sop leaves tea daily to fight cancer     ??? UNABLE TO FIND Med Name: black seed oil     ??? UNABLE TO FIND Take 1 tablet by mouth daily with evening meal. Med Name: Prostate Complete     ??? UNABLE TO FIND Beet root capsule     ??? vitamin E 400 UNIT capsule Take 400 Units by mouth daily.     ??? zinc-pumpkin seed oil-saw palm 7.5-40-160 mg cap Take by mouth.       No current facility-administered medications for this visit.       No Known Allergies    Patient Active Problem List   Diagnosis   ??? Impotence of organic origin   ??? Malignant neoplasm of prostate (CMS-HCC)   ??? Congenital anomaly of aorta   ??? Secondary malignant neoplasm of bone (CMS-HCC)   ??? Inguinal hernia   ??? Congenital anomaly of urinary system   ??? Chronic airway obstruction (CMS-HCC)   ??? Hyperlipidemia   ??? Gynecomastia, male   ??? Bladder outlet obstruction   ??? Adenocarcinoma of esophagus (CMS-HCC)       Reviewed and up to date in Epic.    Appropriateness of Therapy     Is medication and dose appropriate based on diagnosis? Yes    Prescription has been clinically reviewed: Yes    Baseline Quality of Life Assessment      How many days over the past month did your prostate cancer  keep you from your normal activities? For example, brushing your teeth or getting up in the morning. 0    Financial Information     Medication Assistance provided: Prior Authorization and [employer]  Anticipated copay of $0 / 30 DAYS reviewed with patient. Verified delivery address.    Delivery Information     Scheduled delivery date: 01/10/20    Expected start date: 01/10/20    Medication will be delivered via Same Day Courier to the prescription address in [facility].  This shipment will not require a signature.      Explained the services we provide at [facility] and that each month we would call to set up refills.  Stressed importance of returning phone calls so that we could ensure they receive their medications in time each month.  Informed patient that we should be setting up refills 7-10 days prior to when they will run out of medication.  A pharmacist will reach out to perform a clinical assessment periodically.  Informed patient that a welcome packet and a drug information handout will be sent.      Patient verbalized understanding of the above information as well as how to contact the pharmacy at [phone] option 4 with any questions/concerns.  The pharmacy is open Monday through Friday 8:30am-4:30pm.  A pharmacist is available 24/7 via pager to answer any clinical questions they may have.    Patient Specific Needs     - Does the patient have any physical, cognitive, or cultural barriers? No    - Patient prefers to have medications discussed with  Patient     - Is the patient or caregiver able to read and understand education materials at a high school level or above? Yes    - Patient's primary language is  English     - Is the patient high risk? Yes, patient is taking oral chemotherapy. Appropriateness of therapy has been assessed.     - Does the patient require a Care Management Plan? No     - Does the patient require physician intervention or other additional services (i.e. nutrition, smoking cessation, social work)? No      Kyl Givler [facility] Specialty Pharmacist

## 2020-01-09 NOTE — Unmapped (Signed)
Hi,     Patient contacted the Communication Center requesting to speak with the care team of [name] to discuss:    I got a letter from my insurance that my new medication (nubeqa) was denied. I may need a grant. I informed patient that Dr. Philomena Course submitted an appeal on 01/06/20.    Please contact patient at [phone].    Check Indicates criteria has been reviewed and confirmed with the patient:    [x]  Preferred Name   [x]  DOB and/or MR#  [x]  Preferred Contact Method  [x]  Phone Number(s)   []  MyChart     Thank you,   Laverna Peace  [facility]   [phone]

## 2020-01-10 MED FILL — NUBEQA 300 MG TABLET: 30 days supply | Qty: 120 | Fill #0 | Status: AC

## 2020-01-16 NOTE — Unmapped (Signed)
Spoke directly w/pt to inform him of his virtual (phone) visit for next wk; per IB Jaynie Bream) 5/12 @1 :30PM  atf

## 2020-01-17 NOTE — Unmapped (Signed)
Hi,     Alessander contacted the [employer] regarding the following:    - States he needs to reschedule his phone appointment to after 3:00 on 01/22/20 or another day after 3 pm.     Please contact Christina at [phone] .    Thanks in advance,    Drema Balzarine  [facility]   [phone]

## 2020-01-18 ENCOUNTER — Ambulatory Visit
Admit: 2020-01-18 | Discharge: 2020-01-19 | Payer: MEDICARE | Attending: Nurse Practitioner | Primary: Nurse Practitioner

## 2020-01-18 NOTE — Unmapped (Signed)
COVID Pre-Procedure Intake Form     Assessment     Brandon Olsen is a 74 y.o. male presenting to [facility] for COVID testing.     Plan     If no testing performed, pt counseled on routine care for respiratory illness.  If testing performed, COVID sent.  Patient directed to Home given findings during today's visit.    Subjective     Brandon Olsen is a 74 y.o. male who [facility] with complaints of the following:    Exposure History: In the last 21 days?     Have you traveled outside of [place]? No               Have you been in close contact with someone confirmed by a test to have COVID? (Close contact is within 6 feet for at least 10 minutes) No       Have you worked in a health care facility? No     Lived or worked facility like a nursing home, group home, or assisted living?    No         Are you scheduled to have surgery or a procedure in the next 3 days? Yes               Are you scheduled to receive cancer chemotherapy within the next 7 days?    No     Have you ever been tested before for COVID-19 with a swab of your nose? 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     Right now,  do you have any of the following that developed over the past 7 days (as stated by patient on intake form):    Subjective fever (felt feverish) No   Chills (especially repeated shaking chills) No   Severe fatigue (felt very tired) No   Muscle aches No   Runny nose No   Sore throat No   Loss of taste or smell No   Cough (new onset or worsening of chronic cough) No   Shortness of breath No   Nausea or vomiting No   Headache No   Abdominal Pain No   Diarrhea (3 or more loose stools in last 24 hours) No       Scribe's Attestation: Matt Holmes, FNP obtained and performed the history, physical exam and medical decision making elements that were entered into the chart.  Signed by Alleen Borne serving as Scribe, on 01/18/2020 10:37 AM      The documentation recorded by the scribe accurately reflects the service I personally performed and the decisions made by me. Matt Holmes, DNP, AGACNP-BC, NP-C  Jan 18, 2020 11:33 AM

## 2020-01-20 NOTE — Unmapped (Signed)
Pt wants to move his appt to a later time. Appt. has been modified; left vm informing pt.  atf

## 2020-01-21 ENCOUNTER — Ambulatory Visit
Admit: 2020-01-21 | Discharge: 2020-01-21 | Payer: MEDICARE | Attending: Hematology & Oncology | Primary: Hematology & Oncology

## 2020-01-21 ENCOUNTER — Encounter
Admit: 2020-01-21 | Discharge: 2020-01-21 | Payer: MEDICARE | Attending: Certified Registered" | Primary: Certified Registered"

## 2020-01-21 ENCOUNTER — Other Ambulatory Visit: Admit: 2020-01-21 | Discharge: 2020-01-21 | Payer: MEDICARE

## 2020-01-21 ENCOUNTER — Ambulatory Visit: Admit: 2020-01-21 | Discharge: 2020-01-21 | Payer: MEDICARE

## 2020-01-21 DIAGNOSIS — C7951 Secondary malignant neoplasm of bone: Principal | ICD-10-CM

## 2020-01-21 DIAGNOSIS — C61 Malignant neoplasm of prostate: Principal | ICD-10-CM

## 2020-01-21 LAB — CBC W/ AUTO DIFF
BASOPHILS ABSOLUTE COUNT: 0 10*9/L (ref 0.0–0.1)
BASOPHILS RELATIVE PERCENT: 0.2 %
EOSINOPHILS ABSOLUTE COUNT: 0.1 10*9/L (ref 0.0–0.4)
EOSINOPHILS RELATIVE PERCENT: 2.4 %
HEMATOCRIT: 35.4 % — ABNORMAL LOW (ref 41.0–53.0)
HEMOGLOBIN: 11.7 g/dL — ABNORMAL LOW (ref 13.5–17.5)
MEAN CORPUSCULAR HEMOGLOBIN CONC: 33 g/dL (ref 31.0–37.0)
MEAN CORPUSCULAR HEMOGLOBIN: 31.1 pg (ref 26.0–34.0)
MEAN CORPUSCULAR VOLUME: 94.5 fL (ref 80.0–100.0)
MEAN PLATELET VOLUME: 6.6 fL — ABNORMAL LOW (ref 7.0–10.0)
MONOCYTES ABSOLUTE COUNT: 0.3 10*9/L (ref 0.2–0.8)
MONOCYTES RELATIVE PERCENT: 5.8 %
NEUTROPHILS ABSOLUTE COUNT: 3.8 10*9/L (ref 2.0–7.5)
NEUTROPHILS RELATIVE PERCENT: 69.1 %
PLATELET COUNT: 265 10*9/L (ref 150–440)
RED BLOOD CELL COUNT: 3.74 10*12/L — ABNORMAL LOW (ref 4.50–5.90)
RED CELL DISTRIBUTION WIDTH: 13.3 % (ref 12.0–15.0)
WBC ADJUSTED: 5.5 10*9/L (ref 4.5–11.0)

## 2020-01-21 LAB — CREATININE: CREATININE: 0.68 mg/dL — ABNORMAL LOW (ref 0.70–1.30)

## 2020-01-21 LAB — PHOSPHORUS: Phosphate:MCnc:Pt:Ser/Plas:Qn:: 3

## 2020-01-21 LAB — CALCIUM: Calcium:MCnc:Pt:Ser/Plas:Qn:: 9.4

## 2020-01-21 LAB — ALBUMIN: Albumin:MCnc:Pt:Ser/Plas:Qn:: 3.9

## 2020-01-21 LAB — EGFR CKD-EPI NON-AA MALE
Glomerular filtration rate/1.73 sq M.predicted.non black:ArVRat:Pt:Ser/Plas/Bld:Qn:Creatinine-based formula (CKD-EPI): >90

## 2020-01-21 LAB — PROSTATE SPECIFIC ANTIGEN: Prostate specific Ag:MCnc:Pt:Ser/Plas:Qn:: 393 — ABNORMAL HIGH

## 2020-01-21 LAB — MEAN CORPUSCULAR VOLUME: Erythrocyte mean corpuscular volume:EntVol:Pt:RBC:Qn:Automated count: 94.5

## 2020-01-21 NOTE — Unmapped (Signed)
Lab Results   Component Value Date    PSA 411.00 (H) 12/31/2019    PSA 421.00 (H) 12/10/2019    PSA 384.00 (H) 11/26/2019    PSA 280.00 (H) 10/29/2019    PSA 46.30 (H) 08/28/2019    PSA 153.00 (H) 07/31/2019     Please call [phone] to reach my nurse navigator Mauricia Area for any issues.    For emergencies on Nights, Weekends and Holidays  Call [phone] and ask for the hematology/oncology on call.    Griffin Basil, MD, PhD  Associate Professor of Medicine  Division of Hematology-Oncology    [facility]  Genitourinary Oncology Clinic  Nurse Navigator: Mauricia Area  Fax: [phone]

## 2020-01-21 NOTE — Unmapped (Signed)
Pt's port already accessed upon arrival. Labs collected, port de-accessed.

## 2020-01-21 NOTE — Unmapped (Signed)
[facility] Visit Note    Patient Name: Brandon Olsen  Patient Age: 74 y.o.  Encounter Date: 01/21/2020  Attending Provider:  Kortni Hasten E. Philomena Course, MD  Referring physician: Self, Referred    Assessment  Patient Active Problem List   Diagnosis   ??? Impotence of organic origin   ??? Malignant neoplasm of prostate (CMS-HCC)   ??? Congenital anomaly of aorta   ??? Secondary malignant neoplasm of bone (CMS-HCC)   ??? Inguinal hernia   ??? Congenital anomaly of urinary system   ??? Chronic airway obstruction (CMS-HCC)   ??? Hyperlipidemia   ??? Gynecomastia, male   ??? Bladder outlet obstruction   ??? Adenocarcinoma of esophagus (CMS-HCC)       A 74 y.o. 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 Dx with Gleason 4+3=7 prostate cancer treated with cryoablation then documented bone mets in 2011 treated with ADT, progressed to CRPC 01/2017 asymptomatic. Subsequently treated with enzalutamide + ADT with progression and most recently abiraterone/pred/ADT 05/21/19 now with biochemical and scan progression with new lesions in T7, T12, and increase in pelvic and RP LAD. He is here for transfer of care from Dr Claude Manges and discussion of treatment options.    In 11/2019, we discussed that he has had clear progression of disease by both PSA and imaging (bone and pelvic/RP LAD) suggesting we need to transition him from abiraterone/pred/ADT. He is thankfully asymptomatic from his disease despite a PSA of 384 and clear bone metastases. We emphasized again that he has stage IV disease which is incurable but treatable. Based on current progression, it is more likely that he will die from  Prostate cancer than esophageal cancer (last EGD showed no evidence of esophageal cancer). Next line of care is docetaxel. Unfortunately radium is contraindicated due to pelvic and RP LAD (can be used in bone-only metastatic disease). We discussed docetaxel 75mg /m2 every three weeks for ten cycles. He has no neuropathy from Taxol and tolerated carbo/Taxol quite well. We discussed common side effects of docetaxel including N/V, alopecia, diarrhea, swelling, hypersensitivity reactions, and myelosuppression. Finally we discussed that [name] goals of care are prolongation of survival and maintaining QOL.     Started on darolutamide, tolerating well. PSA is slightly down.    1. Castration Resistant Prostate Cancer with Bone and Pelvic/RP LN Metastases.  - Continue with Darolutamide  - Continue with ADT, Lupron 22.5 mg given last 11/26/19; next due 02/26/20 or later  - Continue denosumab, given today on 01/21/2020. Continue calcium/Vitamin D.   - No somatic test has been done as tumor was from 2007  - Germline testing: negative 02/16/18    2. Esophageal Adenocarcinoma, Stage III T3N0: s/p definitive chemoradiation (carbo/Taxol) per CROSS trial. Most recent EGD 07/15/19 with NED. Per [facility], needs EGD every 3-6 months for two years then every 6 months for the third year then as clinically indicated.     3. Return in 6 weeks, for Eligard, denosumab injections, labs, clinic visit    I personally spent 40 minutes face-to-face and non-face-to-face in the care of this patient, which includes all pre, intra, and post visit time on the date of service.    Reason for Visit  Follow up of metastatic castration resistant prostate cancer    History of Present Illness:  Oncology History Overview Note   - 2007, diagnosed with prostate cancer, Gleason 4+3=7, PSA 5.7. Treated with cryoablation.  - 02/2010, PET scan showed bone mets in femur and sacrum. Started  on hormonal therapy with Lupron.  - 10/2016, PSA nadir of 0.38.  - 01/2017, second consecutive PSA rise to 2.21, CRPC progression documented.  - 4 /2019, PSA 44.2. CT and MRI showed borderline retroperitoneal and pelvic adenopathy and bone mets. Started enzalutamide + ADT.  - 9/19, PSA nadir 33.4  - 9/20, PSA 112, switched to abiraterone/pred/ADT  - 12/20, PSA nadir 46.3  - 2/21, PSA 384, BS with L frontal bone, multiple new vertebral mets and L 11th rib, CT with increase in pelvic and RP LAD  - 3/21, stopped abiraterone/prednisone, with recommendation to start docetaxel. Pt re-started enzalutamide on his own  - 5/21, enzalutamide stopped and darolutamide started     Malignant neoplasm of prostate (CMS-HCC)   01/13/2006 Biopsy    Gleason's 7 ( 4  3) right face, right mid medial, right mid lateral in left mid lateral high-grade PIN.  PSA 5.7 with 6% free PSA      01/24/2006 -  Cancer Staged    Bone scan:  No evidence of metastatic disease     02/02/2006 -  Cancer Staged    CT AP:  Horseshoe kidney present     03/22/2006 Surgery    Cryoablation     12/02/2009 -  Cancer Staged    MRI Pelvis:  No definite abnormality noted within the prostate.   2. Possible rectal lesion along the anterior wall of the rectum. Direct   visualization of the rectum is suggested for further evaluation.      02/25/2010 -  Cancer Staged    PET scan:  Findings concerning for bony metastatic involvement in the anterior right   proximal femur and in the left side of the sacrum as described. Other   areas   are in regions of degenerative changes especially in the lumbar and   cervical   spine areas.     10/15/2014 - 12/26/2017 Chemotherapy    Bicalutamide     09/01/2017 -  Cancer Staged    CT AP:  Bilateral prominent subcentimeter pelvic sidewall lymph nodes which are indeterminate but early metastatic disease cannot be excluded. Attention on follow-up.    Multiple hepatic hypodensities, these have CT appearance of simple cysts and biliary hamartomas. This could be further evaluated with MRI.    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    A 2.6 cm hypodense lesion is detected in the subcutaneous tissues of the right gluteal region. This lesion centrally demonstrates low attenuation close to water. Could be the sequela of fat necrosis or a small fluid collection secondary to injection. The lesion demonstrates no obvious rim enhancement or associated stranding. Correlation with clinical findings is recommended to exclude an abscess.  ??     09/01/2017 -  Cancer Staged    Bone scan: No evidence of osseous metastatic disease         12/12/2017 -  Cancer Staged    MRI AP:  --Irregular prostate mass with extracapsular extension consistent with primary malignancy.  --Metastatic bilateral iliac and lower para-aortic lymphadenopathy.  --Vertebral and pelvic bone metastases.  --Peripherally enhancing fluid collection in the lateral aspect of the right gluteus maximus is favored to be posttraumatic in nature. Abscess could have this appearance in appropriate clinical setting.     12/12/2017 -  Cancer Staged    LD Screening Chest CT:  New lytic lesions in the thoracic spine, compatible with metastases, as suggested on same-day MRI of the abdomen and pelvis.    -No pulmonary  nodules identified.     12/25/2017 -  Chemotherapy    Xtandi     02/16/2018 -  Cancer Staged    Prostate panel: No mutations identified. Tested genes: ATM, BRCA1, BRCA2, CHEK2, EPCAM*, MLH1, MSH2, MSH6, NBN, PMS2, TP53, HOXB13 (sequence change only)       02/27/2018 Endocrine/Hormone Therapy    OP LEUPROLIDE (22.5 MG EVERY 3 MONTHS)  Plan Provider: Towanda Malkin, MD     06/26/2019 - 07/31/2019 Endocrine/Hormone Therapy    OP LEUPROLIDE (7.5 MG EVERY MONTH)  Plan Provider: Towanda Malkin, MD     08/28/2019 - 11/26/2019 Endocrine/Hormone Therapy    OP LEUPROLIDE (ELIGARD) 22.5 MG EVERY 3 MONTHS  Plan Provider: Towanda Malkin, MD     11/06/2019 -  Cancer Staged    CT CAP:    No pulmonary metastasis.      Unchanged thoracic vertebral body lytic lesions compared to 01/28/2019.  Interval increase in pelvic and retroperitoneal lymphadenopathy, concerning for progressive metastatic disease, likely originating from prostate.     -Ill-defined posterior soft tissue of the prostate and osseous lesions of the axial and appendicular skeleton are better evaluated on prior MRI of the abdomen and pelvis. L1 vertebral body lytic lesion look unchanged today compared to 10/11/2018 PET/CT.     -Additional chronic and incidental findings as above.     11/06/2019 -  Cancer Staged    Bone scan:  New focal increase in radiotracer uptake in multiple osseous structures such as the left frontal bone, multiple thoracic/lumbar vertebrae and left 11th rib. These are suspicious for potential sites of metastatic disease however degenerative/posttraumatic changes cannot be excluded. R     11/26/2019 - 11/26/2019 Endocrine/Hormone Therapy    OP LEUPROLIDE (LUPRON) 22.5 MG EVERY 3 MONTHS  Plan Provider: Towanda Malkin, MD     02/26/2020 Endocrine/Hormone Therapy    OP LEUPROLIDE (ELIGARD) 45 MG EVERY 6 MONTHS  Plan Provider: Maurie Boettcher, MD     Adenocarcinoma of esophagus (CMS-HCC)   08/31/2018 Initial Diagnosis    Adenocarcinoma of esophagus (CMS-HCC)     08/31/2018 Biopsy    EGD/Colonoscopy:  Distal esophagus: Fragment of adenocarcinoma, and fragments of gastric oxyntic mucosa with healing erosive gastritis.  Stomach antrum: Healing erosive gastritis, negative for H. pylori, dysplasia or malignancy. 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       10/11/2018 -  Cancer Staged    PET CT:  - Circumferential thickening of the distal esophagus with increased FDG uptake, likely correlating to site of known malignancy.  - Focal uptake in the prostate to the right of midline likely represents prostate malignancy.  - Left para-aortic lymph nodes with mild FDG uptake, similar in size to prior MRI. These may be related to prostate cancer given low avidity.  - Redemonstrated multifocal osseous metastases; only the L1 vertebral body metastasis is metabolically active. Note that given low avidity, at least some of these may be related to prostate cancer rather than esophageal cancer.  - FDG uptake in the right posterior gluteal musculature, likely inflammatory; associated fluid collection on prior MRI has mostly resolved. 10/24/2018 -  Cancer Staged    EUS:  Malignant esophageal tumor was found in the lower third                      of the esophagus.                     -  A mass was found in the lower third of the esophagus.                      This was staged T3 (early) N0 Mx by endosonographic                      criteria.                     - One benign lymph node was visualized in the middle                      paraesophageal mediastinum (level 32M).                     - No specimens collected.     11/26/2018 -  Chemotherapy    OP GI CARBOPLATIN/PACLITAXEL (XRT)  PACLItaxel 50 mg/m2 and CARBOplatin AUC 2: held last week of radiation due to low Segs/Plt       01/28/2019 -  Cancer Staged    CT Chest:    Diffuse distal esophageal wall thickening without discrete mass.  ??  No evidence of pulmonary metastases.  ??  Multilevel lytic vertebral body lesions, better characterized on previous PET/CT and abdominal MRI             02/26/2019 Biopsy    Esophagus, distal, biopsy  - Scant fragments of poorly differentiated invasive adenocarcinoma in a background of squamocolumnar mucosa with extensive ulceration, acute and chronic inflammation, and edema  - No intestinal metaplasia identified     04/16/2019 Biopsy    Gastroesophageal junction, biopsy:   - Cardio-oxyntic type and squamocolumnar junction mucosa with erosion, glandular atrophy and reactive epithelial changes  - Fragments of inflammatory granulation tissue consistent with ulcer  - No intestinal metaplasia, dysplasia or carcinoma identified       Previous therapy  Androgen deprivation with Leuprolide  Enzalutamide  Abiraterone    Current therapy  Darolutamide    Interval history    The patient returns for follow up, accompanied by daughter. The patient is doing well. Pt has been on darolutamide for a week or so and hasn't had any problems. Pt is still working and he wants to get both his injections at the same time, to minimize time off from work.    Allergies:  No Known Allergies Current Medications:    Current Outpatient Medications:   ???  albuterol (VENTOLIN HFA) 90 mcg/actuation inhaler, Inhale 2 puffs Four (4) times a day. , Disp: , Rfl:   ???  ascorbic acid (VITAMIN C) 1000 MG tablet, Take 1,000 mg by mouth daily., Disp: , Rfl:   ???  aspirin (ECOTRIN) 81 MG tablet, Take 81 mg by mouth. Frequency:QD   Dosage:81   MG  Instructions:  Note:Dose: 81MG , Disp: , Rfl:   ???  cod liver oil Oil, Take by mouth., Disp: , Rfl:   ???  darolutamide (NUBEQA) 300 mg tablet, Take 2 tablets (600 mg total) by mouth 2 (two) times a day with meals. Take with food. Swallow tablets whole., Disp: 120 tablet, Rfl: 11  ???  famotidine (PEPCID) 40 MG tablet, Take 1 tablet (40 mg total) by mouth nightly., Disp: 30 tablet, Rfl: 11  ???  furosemide (LASIX) 40 MG tablet, Take 1 tablet (40 mg total) by mouth daily. As needed for swelling., Disp: 30 tablet, Rfl: 11  ???  garlic 1 mg cap, Take by mouth  daily. , Disp: , Rfl:   ???  ginger, zingiber officinalis, (GINGER EXTRACT) 250 mg cap, Take by mouth., Disp: , Rfl:   ???  glucosamine sulfate (GLUCOSAMINE) 500 mg Tab, Take 2 tablets by mouth once daily. , Disp: , Rfl:   ???  ibuprofen (MOTRIN) 800 MG tablet, TAKE 1 TABLET BY MOUTH EVERY 8 HOURS AS NEEDED FOR PAIN, Disp: 90 tablet, Rfl: 0  ???  LYCOPENE ORAL, Take by mouth daily. , Disp: , Rfl:   ???  multivitamin-Ca-iron-minerals Tab, Take 1 tablet by mouth., Disp: , Rfl:   ???  NON FORMULARY, 1 capsule daily. [place] , Disp: , Rfl:   ???  omeprazole (PRILOSEC) 40 MG capsule, TAKE 1 CAPSULE BY MOUTH ONCE DAILY 30 MINUTES BEFORE BREAKFAST, Disp: , Rfl:   ???  saw palmetto 160 mg cap, Take 160 mg by mouth., Disp: , Rfl:   ???  tamsulosin (FLOMAX) 0.4 mg capsule, Take 1 capsule by mouth once daily, Disp: 90 capsule, Rfl: 0  ???  UNABLE TO FIND, Med Name: Taking moringa 1,000 mg daily, Baobab po daily and Sour Sop leaves tea daily to fight cancer, Disp: , Rfl:   ???  UNABLE TO FIND, Med Name: black seed oil, Disp: , Rfl:   ???  UNABLE TO FIND, Take 1 tablet by mouth daily with evening meal. Med Name: Prostate Complete, Disp: , Rfl:   ???  UNABLE TO FIND, Beet root capsule, Disp: , Rfl:   ???  UNABLE TO FIND, Take 1 capsule by mouth daily. Med Name: pumpkin seed oil, Disp: , Rfl:   ???  vitamin E 400 UNIT capsule, Take 400 Units by mouth daily., Disp: , Rfl:     Past Medical History and Social History  Past Medical History:   Diagnosis Date   ??? Congenital vascular abnormality    ??? COPD (chronic obstructive pulmonary disease) (CMS-HCC)    ??? Impotence of organic origin    ??? Inguinal hernia without mention of obstruction or gangrene, unilateral or unspecified, (not specified as recurrent)    ??? Malignant neoplasm of prostate (CMS-HCC)     01/16/2006 Gleason's 7 ( 4  3) right face, right mid medial, right mid lateral in left mid lateral high-grade PIN.  PSA 5.7 with 6% free PSA    ??? 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 Unspecified congenital anomaly of urinary system    ??? Unspecified hemorrhoids without mention of complication       Past Surgical History:   Procedure Laterality Date   ??? HEMORRHOID SURGERY     ??? IR INSERT PORT AGE GREATER THAN 5 YRS  11/13/2018    IR INSERT PORT AGE GREATER THAN 5 YRS 11/13/2018 Maree Erie, MD IMG [facility]   ??? PR ENDOSCOPIC US EXAM, ESOPH N/A 10/31/2018    Procedure: UGI ENDOSCOPY; WITH ENDOSCOPIC ULTRASOUND EXAMINATION LIMITED TO THE ESOPHAGUS;  Surgeon: Chriss Driver, MD;  Location: [facility];  Service: Gastroenterology   ??? PR UPPER GI ENDOSCOPY,BIOPSY N/A 02/26/2019    Procedure: UGI ENDOSCOPY; WITH BIOPSY, SINGLE OR MULTIPLE;  Surgeon: Chriss Driver, MD;  [facility]: [facility];  Service: Gastroenterology   ??? PR UPPER GI ENDOSCOPY,BIOPSY N/A 04/16/2019    Procedure: UGI ENDOSCOPY; WITH BIOPSY, SINGLE OR MULTIPLE;  Surgeon: Chriss Driver, MD;  Location: [facility];  Service: Gastroenterology   ??? PR  UPPER GI ENDOSCOPY,BIOPSY N/A 07/15/2019    Procedure: UGI ENDOSCOPY; WITH BIOPSY, SINGLE OR MULTIPLE;  Surgeon: Maris Berger, MD;  Location: [facility];  Service: Gastroenterology   ??? PR UPPER GI ENDOSCOPY,DIAGNOSIS N/A 01/21/2020    Procedure: UGI ENDO, INCLUDE ESOPHAGUS, STOMACH, & DUODENUM &/OR JEJUNUM; DX W/WO COLLECTION SPECIMN, BY BRUSH OR WASH;  Surgeon: Neysa Hotter, MD;  Location: [facility];  Service: Gastroenterology   ??? PROSTATE CRYOABLATION      03/23/2006 left nerve sparing   ??? Seminal Vessicle Biopsy      12/22/2009        Social History     Occupational History   ??? Not on file   Tobacco Use   ??? Smoking status: Former Smoker     Packs/day: 1.00     Years: 50.00     Pack years: 50.00     Types: Cigars   ??? Smokeless tobacco: Never Used   ??? Tobacco comment: 1 cigar a day to every two day   Vaping Use   ??? Vaping Use: Never used   Substance and Sexual Activity   ??? Alcohol use: Yes     Alcohol/week: 19.0 standard drinks     Types: 12 Cans of beer, 7 Shots of liquor per week   ??? Drug use: Not Currently     Types: Marijuana     Comment: Rarely   ??? Sexual activity: Yes     Partners: Female   Pt is married. He is accompanied by his son.     Family History  Family History   Problem Relation Age of Onset   ??? Prostate cancer Brother    ??? Heart disease Mother    ??? Diabetes Mother    ??? Diabetes Sister    ??? Hypertension Sister    ??? Heart disease Brother    ??? Sarcoidosis Son    ??? Cancer Paternal Uncle    ??? Kidney cancer Neg Hx    ??? GU problems Neg Hx    ??? Urolithiasis Neg Hx    H/o prostate cancer in brother, who is alive with cancer at age 50 currently.  Pt has a son and a daughter, aged 34 and 63.      Review of Systems:  A comprehensive review of 10 systems was negative except for pertinent positives noted in HPI.    Physical Exam:    VITAL SIGNS:  BP 133/71  - Pulse 66  - Temp 36.3 ??C (97.4 ??F) (Oral)  - Resp 16  - Ht 175.3 cm (5' 9.02)  - Wt (!) 102.7 kg (226 lb 6.4 oz)  - SpO2 100%  - BMI 33.42 kg/m??   GENERAL: Well-developed, well-nourished patient in no acute distress.  PSYCHIATRIC: Alert and oriented.  Normal mood and affect.      Results/Orders:    Lab on 01/21/2020   Component Date Value Ref Range Status   ??? PSA 01/21/2020 393.00* 0.00 - 4.00 ng/mL Final   ??? Phosphorus 01/21/2020 3.0  2.9 - 4.7 mg/dL Final   ??? Creatinine 01/21/2020 0.68* 0.70 - 1.30 mg/dL Final   ??? EGFR CKD-EPI Non-African American,* 01/21/2020 >90  >=60 mL/min/1.6m2 Final   ??? EGFR CKD-EPI African American, Male 01/21/2020 >90  >=60 mL/min/1.50m2 Final   ??? Calcium 01/21/2020 9.4  8.5 - 10.2 mg/dL Final   ??? Albumin 16/06/9603 3.9  3.5 - 5.0 g/dL Final   ??? WBC 54/05/8118 5.5  4.5 -  11.0 10*9/L Final   ??? RBC 01/21/2020 3.74* 4.50 - 5.90 10*12/L Final   ??? HGB 01/21/2020 11.7* 13.5 - 17.5 g/dL Final   ??? HCT 16/06/9603 35.4* 41.0 - 53.0 % Final   ??? MCV 01/21/2020 94.5  80.0 - 100.0 fL Final   ??? MCH 01/21/2020 31.1  26.0 - 34.0 pg Final   ??? MCHC 01/21/2020 33.0  31.0 - 37.0 g/dL Final   ??? RDW 54/05/8118 13.3  12.0 - 15.0 % Final   ??? MPV 01/21/2020 6.6* 7.0 - 10.0 fL Final   ??? Platelet 01/21/2020 265  150 - 440 10*9/L Final   ??? Neutrophils % 01/21/2020 69.1  % Final   ??? Lymphocytes % 01/21/2020 20.6  % Final   ??? Monocytes % 01/21/2020 5.8  % Final   ??? Eosinophils % 01/21/2020 2.4  % Final   ??? Basophils % 01/21/2020 0.2  % Final   ??? Absolute Neutrophils 01/21/2020 3.8  2.0 - 7.5 10*9/L Final   ??? Absolute Lymphocytes 01/21/2020 1.1* 1.5 - 5.0 10*9/L Final   ??? Absolute Monocytes 01/21/2020 0.3  0.2 - 0.8 10*9/L Final   ??? Absolute Eosinophils 01/21/2020 0.1  0.0 - 0.4 10*9/L Final   ??? Absolute Basophils 01/21/2020 0.0  0.0 - 0.1 10*9/L Final   ??? Large Unstained Cells 01/21/2020 2  0 - 4 % Final   Office Visit on 01/18/2020   Component Date Value Ref Range Status   ??? SARS-CoV-2 PCR 01/18/2020 Not Detected  Not Detected Final       Lab Results   Component Value Date    PSA 393.00 (H) 01/21/2020    PSA 411.00 (H) 12/31/2019 PSA 421.00 (H) 12/10/2019    PSA 384.00 (H) 11/26/2019    PSA 280.00 (H) 10/29/2019    PSA 46.30 (H) 08/28/2019    PSA 153.00 (H) 07/31/2019    PSA 155.00 (H) 06/26/2019    PSA 112.00 (H) 05/21/2019    PSA 92.90 (H) 04/22/2019       Administrations This Visit     denosumab (XGEVA) injection 120 mg     Admin Date  01/21/2020 Action  Given Dose  120 mg Route  Subcutaneous Administered By  Bridgette Habermann, LPN                  Orders placed or performed in visit on 01/21/20   ??? Treatment conditions   ??? Patient education (specify)   ??? Treatment conditions   ??? Patient education (specify)       Imaging results:  NM Bone Scan 11/03/19:  New focal increase in radiotracer uptake in multiple osseous structures with include but not limited to: Left parietal bone, T7, T9, L1 and posterior left 11th rib.  ??  Degenerative changes noted in the bilateral shoulders, right hip, bilateral knees and right foot.  ??  Focal increase in radiotracer uptake in the mandible left of midline which may represent dental disease.  ??  Normal physiologic radiotracer uptake noted in the region of the diffuse kidney and urinary bladder.    CT CAP 11/02/19:  HEPATOBILIARY: Redemonstration of numerous hypoattenuating hepatic lesions measuring up to 2.8 cm (1:29), grossly unchanged from prior favor to represent hepatic cysts versus biliary hamartomas. Additional subcentimeter hypoattenuating lesions are too small to characterize accurately on CT but grossly unchanged from prior. The gallbladder is present and otherwise unremarkable. No biliary dilatation.    SPLEEN: Unremarkable.  PANCREAS: Unremarkable.  ??  ADRENALS: Mild thickening of  the left adrenal gland, unchanged from prior. Unremarkable right adrenal gland.  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 No hydronephrosis.  ??  BLADDER: Unremarkable.  PELVIC/REPRODUCTIVE ORGANS: Heterogeneously enhancing prostate with ill-defined soft tissue posteriorly. ??  GI TRACT: No dilated or thick walled loops of bowel. Duodenal diverticulosis. Colonic diverticulosis without evidence of diverticulitis. Normal appendix.  ??  PERITONEUM/RETROPERITONEUM AND MESENTERY: No free air or fluid.  LYMPH NODES: Enlarged retroperitoneal lymph nodes (for example, 1:54) including a lymph node conglomerate measuring up to 4.7 x 2.8 cm (1:66). An additional 1.6 cm enlarged left internal iliac lymph node (1.5).  ??  VESSELS: The aorta is normal in caliber.  No significant calcified atherosclerotic disease. The portal venous system is patent. The hepatic veins and IVC are unremarkable.  ??  BONES AND SOFT TISSUES: Grossly unchanged lytic lesion of the L1 vertebral body (1:46). Other previously noted osseous metastases seen on prior MRI are not well evaluated here. No new suspicious lytic or blastic osseous lesions. Multilevel degenerative changes of the spine. Bilateral small fat-containing inguinal hernias.

## 2020-01-22 ENCOUNTER — Institutional Professional Consult (permissible substitution)
Admit: 2020-01-22 | Discharge: 2020-01-23 | Payer: MEDICARE | Attending: Pharmacist Clinician (PhC)/ Clinical Pharmacy Specialist | Primary: Pharmacist Clinician (PhC)/ Clinical Pharmacy Specialist

## 2020-01-22 NOTE — Unmapped (Signed)
Clinical Pharmacist Practitioner: [facility]- Telephone Encounter    I spent 25 minutes on the phone with the patient on the date of service. I spent an additional 15 minutes on pre- and post-visit activities on the date of service.     The patient was physically located in [place] or a state in which I am permitted to provide care. The patient and/or parent/guardian understood that s/he may incur co-pays and cost sharing, and agreed to the telemedicine visit. The visit was reasonable and appropriate under the circumstances given the patient's presentation at the time.    The patient and/or parent/guardian has been advised of the potential risks and limitations of this mode of treatment (including, but not limited to, the absence of in-person examination) and has agreed to be treated using telemedicine. The patient's/patient's family's questions regarding telemedicine have been answered.     If the visit was completed in an ambulatory setting, the patient and/or parent/guardian has also been advised to contact their provider???s office for worsening conditions, and seek emergency medical treatment and/or call 911 if the patient deems either necessary.      Patient Name: Brandon Olsen  Patient Age: 74 y.o.    Assessment and recommendations:  A 74 y.o. 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 Dx with Gleason 4+3=7 prostate cancer treated with cryoablation then documented bone mets in 2011 treated with ADT, progressed to CRPC 01/2017 asymptomatic. Subsequently treated with enzalutamide + ADT with progression and most recently abiraterone/pred/ADT 05/21/19 now with biochemical and scan progression with new lesions in T7, T12, and increase in pelvic and RP LAD. He has had clear progression of disease by both PSA and imaging (bone and pelvic/RP LAD), he is asymptomatic from his disease despite a PSA of 384 and clear bone metastases. Discussion was had about docetaxel, he has no neuropathy from previous Taxol and tolerated carbo/Taxol quite well during treatment for esophageal adenocarcinoma. Patient decided against chemotherapy so will continue AR directed therapy with darolutamide.    1. Castration Resistant Prostate Cancer with Bone and Pelvic/RP LN Metastases- started darolutamide on 01/10/20. Has tolerated well with no new or worsening AR associated side effects. Baseline PSA 411 prior to start has come down to 393 on 01/21/20.   - Continue darolutamide 600 mg BID  - Continue with ADT, Lupron 22.5 mg given last 11/26/19; next due 02/26/20 or later  - Germline testing: negative 02/16/18  ??  2. Esophageal Adenocarcinoma, Stage III T3N0: s/p definitive chemoradiation (carbo/Taxol) per CROSS trial. Most recent EGD 07/15/19 with NED. Per [facility], needs EGD every 3-6 months for two years then every 6 months for the third year then as clinically indicated. Has an EGD coming up.     3. Bone health- On denosumab for SRE prevention, has not been taking any calcium or vitamin D supplementation recently.  - Recommended calcium citrate plus vitamin D as patient is on PPI  - Last Xgeva 11/26/19; next due 01/07/20 or later     4. Medication Management- reviewed and updated medication list including all supplements. Discussed risk benefit of taking multiple supplements. Also reports not being able to afford some of his prescription medications. Discussed DDI potential with CBD, moringa and saw palmetto. He is taking 12 supplements per day in addition to prescription medications.  - Recommended discontinuing the bulk of supplements except aspirin, calcium/vit D and multivitamin, Brandon Olsen will think about this.     Follow- up: Dr Philomena Course on 6/22,  CPP visit in 2 months.    ______________________________________________________________________    Reason for visit:  Darolutamide monitoring and side effect management    Current Drug and Dose: Darolutamide 600 mg BID    Date of Initiation: 01/10/2020 Oral Agent Toxicities:  none    Interval History:  Feeling well so far since starting darolutamide. He has not changes in his baseline ADT symptoms. Reports long standing hot flashes but very mild, only once per day. He reports no worsening fatigue or changes in energy level.    Adherence: Taking 2 tablets twice daily with food, no missed doses    Drug Interactions: potential DDI with supplements    Oncology History Overview Note   - 2007, diagnosed with prostate cancer, Gleason 4+3=7, PSA 5.7. Treated with cryoablation.  - 02/2010, PET scan showed bone mets in femur and sacrum. Started on hormonal therapy with Lupron.  - 10/2016, PSA nadir of 0.38.  - 01/2017, second consecutive PSA rise to 2.21, CRPC progression documented.  - 4 /2019, PSA 44.2. CT and MRI showed borderline retroperitoneal and pelvic adenopathy and bone mets. Started enzalutamide + ADT.  - 9/19, PSA nadir 33.4  - 9/20, PSA 112, switched to abiraterone/pred/ADT  - 12/20, PSA nadir 46.3  - 2/21, PSA 384, BS with L frontal bone, multiple new vertebral mets and L 11th rib, CT with increase in pelvic and RP LAD  - 3/21, stopped abiraterone/prednisone, with recommendation to start docetaxel. Pt re-started enzalutamide on his own     Malignant neoplasm of prostate (CMS-HCC)   01/13/2006 Biopsy    Gleason's 7 ( 4  3) right face, right mid medial, right mid lateral in left mid lateral high-grade PIN.  PSA 5.7 with 6% free PSA      01/24/2006 -  Cancer Staged    Bone scan:  No evidence of metastatic disease     02/02/2006 -  Cancer Staged    CT AP:  Horseshoe kidney present     03/22/2006 Surgery    Cryoablation     12/02/2009 -  Cancer Staged    MRI Pelvis:  No definite abnormality noted within the prostate.   2. Possible rectal lesion along the anterior wall of the rectum. Direct   visualization of the rectum is suggested for further evaluation.      02/25/2010 -  Cancer Staged    PET scan:  Findings concerning for bony metastatic involvement in the anterior right proximal femur and in the left side of the sacrum as described. Other   areas   are in regions of degenerative changes especially in the lumbar and   cervical   spine areas.     10/15/2014 - 12/26/2017 Chemotherapy    Bicalutamide     09/01/2017 -  Cancer Staged    CT AP:  Bilateral prominent subcentimeter pelvic sidewall lymph nodes which are indeterminate but early metastatic disease cannot be excluded. Attention on follow-up.    Multiple hepatic hypodensities, these have CT appearance of simple cysts and biliary hamartomas. This could be further evaluated with MRI.    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    A 2.6 cm hypodense lesion is detected in the subcutaneous tissues of the right gluteal region. This lesion centrally demonstrates low attenuation close to water. Could be the sequela of fat necrosis or a small fluid collection secondary to injection. The lesion demonstrates no obvious rim enhancement or associated stranding. Correlation with clinical findings is recommended to exclude an abscess.  ??  09/01/2017 -  Cancer Staged    Bone scan: No evidence of osseous metastatic disease         12/12/2017 -  Cancer Staged    MRI AP:  --Irregular prostate mass with extracapsular extension consistent with primary malignancy.  --Metastatic bilateral iliac and lower para-aortic lymphadenopathy.  --Vertebral and pelvic bone metastases.  --Peripherally enhancing fluid collection in the lateral aspect of the right gluteus maximus is favored to be posttraumatic in nature. Abscess could have this appearance in appropriate clinical setting.     12/12/2017 -  Cancer Staged    LD Screening Chest CT:  New lytic lesions in the thoracic spine, compatible with metastases, as suggested on same-day MRI of the abdomen and pelvis.    -No pulmonary nodules identified.     12/25/2017 -  Chemotherapy    Xtandi     02/16/2018 -  Cancer Staged    Prostate panel: No mutations identified. Tested genes: ATM, BRCA1, BRCA2, CHEK2, EPCAM*, MLH1, MSH2, MSH6, NBN, PMS2, TP53, HOXB13 (sequence change only)       02/27/2018 Endocrine/Hormone Therapy    OP LEUPROLIDE (22.5 MG EVERY 3 MONTHS)  Plan Provider: Towanda Malkin, MD     06/26/2019 - 07/31/2019 Endocrine/Hormone Therapy    OP LEUPROLIDE (7.5 MG EVERY MONTH)  Plan Provider: Towanda Malkin, MD     08/28/2019 - 11/26/2019 Endocrine/Hormone Therapy    OP LEUPROLIDE (ELIGARD) 22.5 MG EVERY 3 MONTHS  Plan Provider: Towanda Malkin, MD     11/06/2019 -  Cancer Staged    CT CAP:    No pulmonary metastasis.      Unchanged thoracic vertebral body lytic lesions compared to 01/28/2019.  Interval increase in pelvic and retroperitoneal lymphadenopathy, concerning for progressive metastatic disease, likely originating from prostate.     -Ill-defined posterior soft tissue of the prostate and osseous lesions of the axial and appendicular skeleton are better evaluated on prior MRI of the abdomen and pelvis. L1 vertebral body lytic lesion look unchanged today compared to 10/11/2018 PET/CT.     -Additional chronic and incidental findings as above.     11/06/2019 -  Cancer Staged    Bone scan:  New focal increase in radiotracer uptake in multiple osseous structures such as the left frontal bone, multiple thoracic/lumbar vertebrae and left 11th rib. These are suspicious for potential sites of metastatic disease however degenerative/posttraumatic changes cannot be excluded. R     11/26/2019 - 11/26/2019 Endocrine/Hormone Therapy    OP LEUPROLIDE (LUPRON) 22.5 MG EVERY 3 MONTHS  Plan Provider: Towanda Malkin, MD     02/26/2020 Endocrine/Hormone Therapy    OP LEUPROLIDE (ELIGARD) 45 MG EVERY 6 MONTHS  Plan Provider: Maurie Boettcher, MD     Adenocarcinoma of esophagus (CMS-HCC)   08/31/2018 Initial Diagnosis    Adenocarcinoma of esophagus (CMS-HCC)     08/31/2018 Biopsy    EGD/Colonoscopy:  Distal esophagus: Fragment of adenocarcinoma, and fragments of gastric oxyntic mucosa with healing erosive gastritis.  Stomach antrum: Healing erosive gastritis, negative for H. pylori, dysplasia or malignancy. 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       10/11/2018 -  Cancer Staged    PET CT:  - Circumferential thickening of the distal esophagus with increased FDG uptake, likely correlating to site of known malignancy.  - Focal uptake in the prostate to the right of midline likely represents prostate malignancy.  - Left para-aortic lymph nodes with mild FDG uptake, similar in size to prior  MRI. These may be related to prostate cancer given low avidity.  - Redemonstrated multifocal osseous metastases; only the L1 vertebral body metastasis is metabolically active. Note that given low avidity, at least some of these may be related to prostate cancer rather than esophageal cancer.  - FDG uptake in the right posterior gluteal musculature, likely inflammatory; associated fluid collection on prior MRI has mostly resolved.     10/24/2018 -  Cancer Staged    EUS:  Malignant esophageal tumor was found in the lower third                      of the esophagus.                     - A mass was found in the lower third of the esophagus.                      This was staged T3 (early) N0 Mx by endosonographic                      criteria.                     - One benign lymph node was visualized in the middle                      paraesophageal mediastinum (level 67M).                     - No specimens collected.     11/26/2018 -  Chemotherapy    OP GI CARBOPLATIN/PACLITAXEL (XRT)  PACLItaxel 50 mg/m2 and CARBOplatin AUC 2: held last week of radiation due to low Segs/Plt       01/28/2019 -  Cancer Staged    CT Chest:    Diffuse distal esophageal wall thickening without discrete mass.  ??  No evidence of pulmonary metastases.  ??  Multilevel lytic vertebral body lesions, better characterized on previous PET/CT and abdominal MRI             02/26/2019 Biopsy    Esophagus, distal, biopsy  - Scant fragments of poorly differentiated invasive adenocarcinoma in a background of squamocolumnar mucosa with extensive ulceration, acute and chronic inflammation, and edema  - No intestinal metaplasia identified     04/16/2019 Biopsy    Gastroesophageal junction, biopsy:   - Cardio-oxyntic type and squamocolumnar junction mucosa with erosion, glandular atrophy and reactive epithelial changes  - Fragments of inflammatory granulation tissue consistent with ulcer  - No intestinal metaplasia, dysplasia or carcinoma identified     11/13/2019 -  Chemotherapy    OP PROSTATE DOCETAXEL/PREDNISONE  Docetaxel 75 mg/m2 evry 21 days         Vital Signs for this encounter:  There were no vitals taken for this visit.  Wt Readings from Last 3 Encounters:   01/21/20 (!) 108.9 kg (240 lb)   01/21/20 (!) 102.7 kg (226 lb 6.4 oz)   12/31/19 (!) 102.4 kg (225 lb 11.2 oz)       Medications:  No current facility-administered medications for this visit.     No current outpatient medications on file.     Facility-Administered Medications Ordered in Other Visits   Medication Dose Route Frequency Provider Last Rate Last Admin   ??? lactated Ringers infusion  10 mL/hr Intravenous Continuous Neysa Hotter, MD 10 mL/hr at  01/21/20 0806 10 mL/hr at 01/21/20 0806       LABS:  Lab Results   Component Value Date    WBC 5.5 01/21/2020    HGB 11.7 (L) 01/21/2020    HCT 35.4 (L) 01/21/2020    PLT 265 01/21/2020       Lab Results   Component Value Date    NA 137 12/31/2019    K 4.4 12/31/2019    CL 100 12/31/2019    CO2 28.0 12/31/2019    BUN 15 12/31/2019    CREATININE 0.68 (L) 01/21/2020    GLU 96 12/31/2019    CALCIUM 9.4 01/21/2020    MG 1.8 12/31/2018    PHOS 3.0 01/21/2020       Lab Results   Component Value Date    BILITOT 0.7 12/31/2019    BILIDIR 0.10 12/10/2019    PROT 6.8 12/31/2019    ALBUMIN 3.9 01/21/2020    ALT 13 12/31/2019    AST 27 12/31/2019    ALKPHOS 59 12/31/2019     I spent 25 minutes in direct patient care.    Laverna Peace PharmD, BCOP, CPP Hematology/Oncology Pharmacist  P: [phone]

## 2020-01-30 NOTE — Unmapped (Signed)
[facility] Clinical Assessment & Refill Coordination Note    Mr. Olsen reported stopping Saw Palmetto supplement.  He asked about pumpkin seed supplements.  He was advised to not start to take any additional supplements/herbal products without discussing with pharmacy or provider.  He reports eating pumpkin seeds but not taking a supplement.  He takes a multivitamin.    Brandon Olsen, DOB: 1945/12/14  Phone: [phone] (home)     All above HIPAA information was verified with patient.     Was a Nurse, learning disability used for this call? No    Specialty Medication(s):   Hematology/Oncology: Nubeqa     Current Outpatient Medications   Medication Sig Dispense Refill   ??? albuterol (VENTOLIN HFA) 90 mcg/actuation inhaler Inhale 2 puffs Four (4) times a day.      ??? ascorbic acid (VITAMIN C) 1000 MG tablet Take 1,000 mg by mouth daily.     ??? aspirin (ECOTRIN) 81 MG tablet Take 81 mg by mouth. Frequency:QD   Dosage:81   MG  Instructions:  Note:Dose: 81MG      ??? cod liver oil Oil Take by mouth.     ??? darolutamide (NUBEQA) 300 mg tablet Take 2 tablets (600 mg total) by mouth 2 (two) times a day with meals. Take with food. Swallow tablets whole. 120 tablet 11   ??? famotidine (PEPCID) 40 MG tablet Take 1 tablet (40 mg total) by mouth nightly. 30 tablet 11   ??? furosemide (LASIX) 40 MG tablet Take 1 tablet (40 mg total) by mouth daily. As needed for swelling. 30 tablet 11   ??? garlic 1 mg cap Take by mouth daily.      ??? ginger, zingiber officinalis, (GINGER EXTRACT) 250 mg cap Take by mouth.     ??? glucosamine sulfate (GLUCOSAMINE) 500 mg Tab Take 2 tablets by mouth once daily.      ??? ibuprofen (MOTRIN) 800 MG tablet TAKE 1 TABLET BY MOUTH EVERY 8 HOURS AS NEEDED FOR PAIN 90 tablet 0   ??? LYCOPENE ORAL Take by mouth daily.      ??? multivitamin-Ca-iron-minerals Tab Take 1 tablet by mouth.     ??? NON FORMULARY 1 capsule daily. [place]      ??? omeprazole (PRILOSEC) 40 MG capsule TAKE 1 CAPSULE BY MOUTH ONCE DAILY 30 MINUTES BEFORE BREAKFAST     ??? saw palmetto 160 mg cap Take 160 mg by mouth.     ??? tamsulosin (FLOMAX) 0.4 mg capsule Take 1 capsule by mouth once daily 90 capsule 0   ??? UNABLE TO FIND Med Name: Taking moringa 1,000 mg daily, Baobab po daily and Sour Sop leaves tea daily to fight cancer     ??? UNABLE TO FIND Med Name: black seed oil     ??? UNABLE TO FIND Take 1 tablet by mouth daily with evening meal. Med Name: Prostate Complete     ??? UNABLE TO FIND Beet root capsule     ??? UNABLE TO FIND Take 1 capsule by mouth daily. Med Name: pumpkin seed oil     ??? vitamin E 400 UNIT capsule Take 400 Units by mouth daily.       No current facility-administered medications for this visit.        Changes to medications: Brandon reports no changes at this time.    No Known Allergies    Changes to allergies: No    SPECIALTY MEDICATION ADHERENCE     Nubeqa 300 mg: 12-13 days  of medicine on hand     Medication Adherence    Patient reported X missed doses in the last month: 0  Specialty Medication: Nubeqa 300 - 2 tablets BID with food  Patient is on additional specialty medications: No  Informant: patient  Confirmed plan for next specialty medication refill: delivery by pharmacy          Specialty medication(s) dose(s) confirmed: Regimen is correct and unchanged.     Are there any concerns with adherence? No    Adherence counseling provided? Not needed    CLINICAL MANAGEMENT AND INTERVENTION      Clinical Benefit Assessment:    Do you feel the medicine is effective or helping your condition? Yes    Clinical Benefit counseling provided? Not needed    Adverse Effects Assessment:    Are you experiencing any side effects? No    Are you experiencing difficulty administering your medicine? No    Quality of Life Assessment:    How many days over the past month did your prostate cancer  keep you from your normal activities? For example, brushing your teeth or getting up in the morning. 0    Have you discussed this with your provider? Not needed Therapy Appropriateness:    Is therapy appropriate? Yes, therapy is appropriate and should be continued    DISEASE/MEDICATION-SPECIFIC INFORMATION      N/A    PATIENT SPECIFIC NEEDS     - Does the patient have any physical, cognitive, or cultural barriers? No    - Is the patient high risk? Yes, patient is taking oral chemotherapy. Appropriateness of therapy as been assessed.     - Does the patient require a Care Management Plan? No     - Does the patient require physician intervention or other additional services (i.e. nutrition, smoking cessation, social work)? No      SHIPPING     Specialty Medication(s) to be Shipped:   Hematology/Oncology: Nubeqa    Other medication(s) to be shipped: none     Changes to insurance: No    Delivery Scheduled: Yes, Expected medication delivery date: 02/07/20.     Medication will be delivered via Next Day Courier to the confirmed prescription address in [facility].    The patient will receive a drug information handout for each medication shipped and additional FDA Medication Guides as required.  Verified that patient has previously received a Conservation officer, historic buildings.    All of the patient's questions and concerns have been addressed.    Breck Coons [facility] Specialty Pharmacist

## 2020-02-06 MED FILL — NUBEQA 300 MG TABLET: ORAL | 30 days supply | Qty: 120 | Fill #1

## 2020-02-06 MED FILL — NUBEQA 300 MG TABLET: 30 days supply | Qty: 120 | Fill #1 | Status: AC

## 2020-02-11 MED ORDER — BUDESONIDE-FORMOTEROL HFA 80 MCG-4.5 MCG/ACTUATION AEROSOL INHALER
0 days
Start: 2020-02-11 — End: ?

## 2020-02-13 ENCOUNTER — Ambulatory Visit
Admit: 2020-02-13 | Discharge: 2020-03-11 | Payer: MEDICARE | Attending: Radiation Oncology | Primary: Radiation Oncology

## 2020-02-13 DIAGNOSIS — C159 Malignant neoplasm of esophagus, unspecified: Principal | ICD-10-CM

## 2020-02-13 NOTE — Unmapped (Unsigned)
Pt states he feels good. Eating well and continues to work.

## 2020-02-14 NOTE — Unmapped (Signed)
RADIATION ONCOLOGY FOLLOW-UP VISIT NOTE     Encounter Date: 02/13/2020  Patient Name: Brandon Olsen  Medical Record Number: [id number]    DIAGNOSIS:  74yo with cT3N0 adenocarcinoma of the GE junction s/p chemoRT (50.4Gy finished 01/02/2019). ??He also has a history of metastatic prostate cancer with bone metastases with likely progression, currently managed with lupron and enzalutamide    DURATION SINCE COMPLETION OF RADIOTHERAPY:  1 year, 2 months (01/02/2019)    ASSESSMENT:  Disease Status: No evidence of recurrent esophageal cancer on recent EGD although no biopsies obtained.  No evidence of recurrence on last CT C/A/P (10/2019)    RECOMMENDATIONS:  1. FOLLOW-UP:  There is no evidence of recurrent prostate cancer.  His EGD was terminated due to desaturations so biopsies were not obtained, but there were not any concerning masses noted prior to aborting the procedure.  His last cross sectional imaging from a few months ago also showed no evidence of disease.  He continues to follow with medical oncology for his metastatic prostate cancer which unfortunately has been progressing.  He is currently on ADT and darolutamide.  We have previously discussed surveillance options for the esophageal cancer which could include additional imaging and endoscopy.  His recent endoscopy was complicated as noted above and he is very hesitant to repeat this- we will follow with imaging alone for the time being.  Repeat CT C/A/P in 4 months.  2. Nutrition:  No issues- weight is stable  3. Swallowing:  No issues  4. Prostate cancer:  Continue with Dr. Philomena Course as above.    INTERVAL HISTORY:      Doing well today from an esophageal cancer standpoint.  He continues to have minimal swallowing issue.  Occasionally solid food (mostly dry things like chicken) will get stuck but this is not common.  No pain with swallowing.  No hematemesis, no dark colored stools.  His appetite is good and weight is stable.  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  He had an endoscopy on 5/15- this showed no nodularity or ulceration, no stenosis in the esophagus.  Biopsies were not obtained as he had desaturations and the remainder of the endoscopy was aborted.      He has now established with Dr. Philomena Course for management of his metastatic prostate cancer.  He is currently receiving ADT and Darolutamide.  His last PSA was a few weeks ago and was 393 (last check before that was 411).  He does not have any painful bone lesions.      REVIEW OF SYSTEMS:  A comprehensive review of 10 systems was negative except for pertinent positives noted in HPI.    PAST MEDICAL HISTORY/FAMILY HISTORY/SOCIAL HISTORY:  Reviewed in EPIC    ALLERGIES/MEDICATIONS:  Reviewed in EPIC    PHYSICAL EXAM:  Vital Signs for this encounter:   BP 136/68  - Pulse 98  - Temp 35.9 ??C (96.6 ??F) (Temporal)  - Wt (!) 101.7 kg (224 lb 1.6 oz)  - SpO2 95%  - BMI 33.08 kg/m??   Karnofsky/Lansky Performance Status: 80, Normal activity with effort; some signs or symptoms of disease (ECOG equivalent 1)  General:   No acute distress, alert and oriented X 4   Head: Normocephalic, without obvious abnormality, atraumatic  Eyes: EOMI, no scleral icterus  Lungs: normal work of breathing  Heart: regular rate and rhythm, S1, S2 normal, no murmur, click, rub or gallop  Abdomen: soft, non-tender, non-distedned  Extremities: extremities normal, atraumatic,  no cyanosis or edema  Lymph nodes: No palpable supraclavicular or cervical LN  Neurologic: Grossly normal    RADIOLOGY:  None to review    Labs:    No results found for: WBC, HGB, HCT, PLT, LDH, CREATININE, AST, ALT, MG    Rayetta Humphrey, MD  Assistant Professor  [facility] Dept of Radiation Oncology  02/14/2020

## 2020-02-24 DIAGNOSIS — N3941 Urge incontinence: Principal | ICD-10-CM

## 2020-02-24 MED ORDER — TAMSULOSIN 0.4 MG CAPSULE
ORAL_CAPSULE | 0 refills | 0 days
Start: 2020-02-24 — End: ?

## 2020-02-26 DIAGNOSIS — N3941 Urge incontinence: Principal | ICD-10-CM

## 2020-02-26 MED ORDER — TAMSULOSIN 0.4 MG CAPSULE: 0 mg | capsule | Freq: Every day | 3 refills | 90 days | Status: AC

## 2020-02-26 MED ORDER — TAMSULOSIN 0.4 MG CAPSULE
ORAL_CAPSULE | Freq: Every day | ORAL | 3 refills | 90.00000 days | Status: CP
Start: 2020-02-26 — End: 2020-02-26

## 2020-02-26 NOTE — Unmapped (Signed)
Spoke with pt- sent a prescription for flomax to local pharmacy.

## 2020-02-26 NOTE — Unmapped (Signed)
Patient previously had this medication ordered by Dr Claude Manges and is requesting a refill.

## 2020-02-26 NOTE — Unmapped (Signed)
[facility] Refill Coordination Note    Specialty Medication(s) to be Shipped:   Hematology/Oncology: Brandon Olsen    Other medication(s) to be shipped: n/a     Brandon Olsen, DOB: 04/14/1946  Phone: [phone] (home)       All above HIPAA information was verified with patient.     Was a Nurse, learning disability used for this call? No    Completed refill call assessment today to schedule patient's medication shipment from the [facility] [phone]).       Specialty medication(s) and dose(s) confirmed: Regimen is correct and unchanged.   Changes to medications: Brandon Olsen reports no changes at this time.  Changes to insurance: No  Questions for the pharmacist: No    Confirmed patient received Welcome Packet with first shipment. The patient will receive a drug information handout for each medication shipped and additional FDA Medication Guides as required.       DISEASE/MEDICATION-SPECIFIC INFORMATION        N/A    SPECIALTY MEDICATION ADHERENCE     Medication Adherence    Patient reported X missed doses in the last month: 0  Specialty Medication: Nubeqa 300mg   Patient is on additional specialty medications: No  Informant: patient                Nubeqa 300 mg: 14 days of medicine on hand         SHIPPING     Shipping address confirmed in Epic.     Delivery Scheduled: Yes, Expected medication delivery date: 03/09/20.     Medication will be delivered via Same Day Courier to the prescription address in [employer] [place].    Brandon Olsen   [facility] Specialty Technician

## 2020-03-03 ENCOUNTER — Other Ambulatory Visit: Admit: 2020-03-03 | Discharge: 2020-03-03 | Payer: MEDICARE

## 2020-03-03 ENCOUNTER — Ambulatory Visit
Admit: 2020-03-03 | Discharge: 2020-03-03 | Payer: MEDICARE | Attending: Hematology & Oncology | Primary: Hematology & Oncology

## 2020-03-03 ENCOUNTER — Institutional Professional Consult (permissible substitution): Admit: 2020-03-03 | Discharge: 2020-03-03 | Payer: MEDICARE

## 2020-03-03 DIAGNOSIS — C61 Malignant neoplasm of prostate: Principal | ICD-10-CM

## 2020-03-03 LAB — CBC W/ AUTO DIFF
BASOPHILS ABSOLUTE COUNT: 0 10*9/L (ref 0.0–0.1)
BASOPHILS RELATIVE PERCENT: 0.3 %
EOSINOPHILS ABSOLUTE COUNT: 0.2 10*9/L (ref 0.0–0.4)
EOSINOPHILS RELATIVE PERCENT: 5.2 %
HEMATOCRIT: 35.6 % — ABNORMAL LOW (ref 41.0–53.0)
HEMOGLOBIN: 11.3 g/dL — ABNORMAL LOW (ref 13.5–17.5)
LARGE UNSTAINED CELLS: 2 % (ref 0–4)
LYMPHOCYTES ABSOLUTE COUNT: 1.1 10*9/L — ABNORMAL LOW (ref 1.5–5.0)
LYMPHOCYTES RELATIVE PERCENT: 25.3 %
MEAN CORPUSCULAR HEMOGLOBIN CONC: 31.7 g/dL (ref 31.0–37.0)
MEAN CORPUSCULAR HEMOGLOBIN: 30.4 pg (ref 26.0–34.0)
MEAN CORPUSCULAR VOLUME: 95.8 fL (ref 80.0–100.0)
MEAN PLATELET VOLUME: 6.4 fL — ABNORMAL LOW (ref 7.0–10.0)
MONOCYTES ABSOLUTE COUNT: 0.3 10*9/L (ref 0.2–0.8)
MONOCYTES RELATIVE PERCENT: 5.9 %
NEUTROPHILS ABSOLUTE COUNT: 2.8 10*9/L (ref 2.0–7.5)
NEUTROPHILS RELATIVE PERCENT: 61.6 %
RED BLOOD CELL COUNT: 3.72 10*12/L — ABNORMAL LOW (ref 4.50–5.90)
RED CELL DISTRIBUTION WIDTH: 13.4 % (ref 12.0–15.0)
WBC ADJUSTED: 4.5 10*9/L (ref 4.5–11.0)

## 2020-03-03 LAB — CALCIUM: Calcium:MCnc:Pt:Ser/Plas:Qn:: 9.7

## 2020-03-03 LAB — PHOSPHORUS: Phosphate:MCnc:Pt:Ser/Plas:Qn:: 3.6

## 2020-03-03 LAB — CREATININE
Creatinine:MCnc:Pt:Ser/Plas:Qn:: 0.67 — ABNORMAL LOW
EGFR CKD-EPI AA MALE: >90 mL/min/{1.73_m2} (ref >=60)
EGFR CKD-EPI NON-AA MALE: >90 mL/min/{1.73_m2} (ref >=60)

## 2020-03-03 LAB — MEAN CORPUSCULAR HEMOGLOBIN: Erythrocyte mean corpuscular hemoglobin:EntMass:Pt:RBC:Qn:Automated count: 30.4

## 2020-03-03 LAB — ALBUMIN: Albumin:MCnc:Pt:Ser/Plas:Qn:: 3.8

## 2020-03-03 LAB — PROSTATE SPECIFIC ANTIGEN: Prostate specific Ag:MCnc:Pt:Ser/Plas:Qn:: 456 — ABNORMAL HIGH

## 2020-03-03 MED ADMIN — leuprolide (6 month) (ELIGARD) injection 45 mg: 45 mg | SUBCUTANEOUS | @ 16:00:00 | Stop: 2020-03-03

## 2020-03-03 NOTE — Unmapped (Signed)
Pt received Eligard injection in right upper abdomen.Tolerated it well. Applied guaze and band aid. Agricultural engineer given. No adverse reaction noted.

## 2020-03-03 NOTE — Unmapped (Unsigned)
Port accessed and labs drawn with no complications by Dalbert Batman.  Port flushed with saline and heparin. Needle removed.

## 2020-03-03 NOTE — Unmapped (Signed)
[facility] Visit Note    Patient Name: Brandon Olsen  Patient Age: 74 y.o.  Encounter Date: 03/03/2020  Attending Provider:  Nolan Lasser E. Philomena Course, MD  Referring physician: Self, Referred    Assessment  Patient Active Problem List   Diagnosis   ??? Impotence of organic origin   ??? Malignant neoplasm of prostate (CMS-HCC)   ??? Congenital anomaly of aorta   ??? Secondary malignant neoplasm of bone (CMS-HCC)   ??? Inguinal hernia   ??? Congenital anomaly of urinary system   ??? Chronic airway obstruction (CMS-HCC)   ??? Hyperlipidemia   ??? Gynecomastia, male   ??? Bladder outlet obstruction   ??? Adenocarcinoma of esophagus (CMS-HCC)       A 74 y.o. 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 Dx with Gleason 4+3=7 prostate cancer treated with cryoablation then documented bone mets in 2011 treated with ADT, progressed to CRPC 01/2017 asymptomatic. Subsequently treated with enzalutamide + ADT with progression and most recently abiraterone/pred/ADT 05/21/19 now with biochemical and scan progression with new lesions in T7, T12, and increase in pelvic and RP LAD. He is here for transfer of care from Dr Claude Manges and discussion of treatment options.    In 11/2019, we discussed that he has had clear progression of disease by both PSA and imaging (bone and pelvic/RP LAD) suggesting we need to transition him from abiraterone/pred/ADT. He is thankfully asymptomatic from his disease despite a PSA of 384 and clear bone metastases. We emphasized again that he has stage IV disease which is incurable but treatable. Based on current progression, it is more likely that he will die from  Prostate cancer than esophageal cancer (last EGD showed no evidence of esophageal cancer). Next line of care is docetaxel. Unfortunately radium is contraindicated due to pelvic and RP LAD (can be used in bone-only metastatic disease). We discussed docetaxel 75mg /m2 every three weeks for ten cycles. He has no neuropathy from Taxol and tolerated carbo/Taxol quite well. We discussed common side effects of docetaxel including N/V, alopecia, diarrhea, swelling, hypersensitivity reactions, and myelosuppression. Finally we discussed that Mr. Gulley goals of care are prolongation of survival and maintaining QOL.     On darolutamide, PSA is up for the first time.  Darolutamide is unlikely to work long-term, since it is the third AR signaling inhibitor (after enzalutamide, abiraterone). Docetaxel would be the next option.    1. Castration Resistant Prostate Cancer with Bone and Pelvic/RP LN Metastases.  - Continue with Darolutamide for now.  - Continue with ADT, Eligard 45 mg given today on 6/22, next due 12/22 or later  - On denosumab, given last on 01/21/2020. Hold denosumab today, since pt is seeing an oral surgeon due to concern for ?ONJ.  Continue calcium/Vitamin D.   - No somatic test has been done as tumor was from 2007  - Germline testing: negative 02/16/18    2. Esophageal Adenocarcinoma, Stage III T3N0: s/p definitive chemoradiation (carbo/Taxol) per CROSS trial. Most recent EGD 07/15/19 with NED. Per [facility], needs EGD every 3-6 months for two years then every 6 months for the third year then as clinically indicated.     3. Return in 3 months, for labs, clinic visit    I personally spent 45 minutes face-to-face and non-face-to-face in the care of this patient, which includes all pre, intra, and post visit time on the date of service.      Reason for Visit  Follow up of metastatic castration resistant  prostate cancer    History of Present Illness:  Oncology History Overview Note   - 2007, diagnosed with prostate cancer, Gleason 4+3=7, PSA 5.7. Treated with cryoablation.  - 02/2010, PET scan showed bone mets in femur and sacrum. Started on hormonal therapy with Lupron.  - 10/2016, PSA nadir of 0.38.  - 01/2017, second consecutive PSA rise to 2.21, CRPC progression documented.  - 4 /2019, PSA 44.2. CT and MRI showed borderline retroperitoneal and pelvic adenopathy and bone mets. Started enzalutamide + ADT.  - 9/19, PSA nadir 33.4  - 9/20, PSA 112, switched to abiraterone/pred/ADT  - 12/20, PSA nadir 46.3  - 2/21, PSA 384, BS with L frontal bone, multiple new vertebral mets and L 11th rib, CT with increase in pelvic and RP LAD  - 3/21, stopped abiraterone/prednisone, with recommendation to start docetaxel. Pt re-started enzalutamide on his own  - 5/21, enzalutamide stopped and darolutamide started     Malignant neoplasm of prostate (CMS-HCC)   01/13/2006 Biopsy    Gleason's 7 ( 4  3) right face, right mid medial, right mid lateral in left mid lateral high-grade PIN.  PSA 5.7 with 6% free PSA      01/24/2006 -  Cancer Staged    Bone scan:  No evidence of metastatic disease     02/02/2006 -  Cancer Staged    CT AP:  Horseshoe kidney present     03/22/2006 Surgery    Cryoablation     12/02/2009 -  Cancer Staged    MRI Pelvis:  No definite abnormality noted within the prostate.   2. Possible rectal lesion along the anterior wall of the rectum. Direct   visualization of the rectum is suggested for further evaluation.      02/25/2010 -  Cancer Staged    PET scan:  Findings concerning for bony metastatic involvement in the anterior right   proximal femur and in the left side of the sacrum as described. Other   areas   are in regions of degenerative changes especially in the lumbar and   cervical   spine areas.     10/15/2014 - 12/26/2017 Chemotherapy    Bicalutamide     09/01/2017 -  Cancer Staged    CT AP:  Bilateral prominent subcentimeter pelvic sidewall lymph nodes which are indeterminate but early metastatic disease cannot be excluded. Attention on follow-up.    Multiple hepatic hypodensities, these have CT appearance of simple cysts and biliary hamartomas. This could be further evaluated with MRI.    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    A 2.6 cm hypodense lesion is detected in the subcutaneous tissues of the right gluteal region. This lesion centrally demonstrates low attenuation close to water. Could be the sequela of fat necrosis or a small fluid collection secondary to injection. The lesion demonstrates no obvious rim enhancement or associated stranding. Correlation with clinical findings is recommended to exclude an abscess.  ??     09/01/2017 -  Cancer Staged    Bone scan: No evidence of osseous metastatic disease         12/12/2017 -  Cancer Staged    MRI AP:  --Irregular prostate mass with extracapsular extension consistent with primary malignancy.  --Metastatic bilateral iliac and lower para-aortic lymphadenopathy.  --Vertebral and pelvic bone metastases.  --Peripherally enhancing fluid collection in the lateral aspect of the right gluteus maximus is favored to be posttraumatic in nature. Abscess could have this appearance in appropriate clinical setting.  12/12/2017 -  Cancer Staged    LD Screening Chest CT:  New lytic lesions in the thoracic spine, compatible with metastases, as suggested on same-day MRI of the abdomen and pelvis.    -No pulmonary nodules identified.     12/25/2017 -  Chemotherapy    Xtandi     02/16/2018 -  Cancer Staged    Prostate panel: No mutations identified. Tested genes: ATM, BRCA1, BRCA2, CHEK2, EPCAM*, MLH1, MSH2, MSH6, NBN, PMS2, TP53, HOXB13 (sequence change only)       02/27/2018 Endocrine/Hormone Therapy    OP LEUPROLIDE (22.5 MG EVERY 3 MONTHS)  Plan Provider: Towanda Malkin, MD     06/26/2019 - 07/31/2019 Endocrine/Hormone Therapy    OP LEUPROLIDE (7.5 MG EVERY MONTH)  Plan Provider: Towanda Malkin, MD     08/28/2019 - 11/26/2019 Endocrine/Hormone Therapy    OP LEUPROLIDE (ELIGARD) 22.5 MG EVERY 3 MONTHS  Plan Provider: Towanda Malkin, MD     11/06/2019 -  Cancer Staged    CT CAP:    No pulmonary metastasis.      Unchanged thoracic vertebral body lytic lesions compared to 01/28/2019.  Interval increase in pelvic and retroperitoneal lymphadenopathy, concerning for progressive metastatic disease, likely originating from prostate.     -Ill-defined posterior soft tissue of the prostate and osseous lesions of the axial and appendicular skeleton are better evaluated on prior MRI of the abdomen and pelvis. L1 vertebral body lytic lesion look unchanged today compared to 10/11/2018 PET/CT.     -Additional chronic and incidental findings as above.     11/06/2019 -  Cancer Staged    Bone scan:  New focal increase in radiotracer uptake in multiple osseous structures such as the left frontal bone, multiple thoracic/lumbar vertebrae and left 11th rib. These are suspicious for potential sites of metastatic disease however degenerative/posttraumatic changes cannot be excluded. R     11/26/2019 - 11/26/2019 Endocrine/Hormone Therapy    OP LEUPROLIDE (LUPRON) 22.5 MG EVERY 3 MONTHS  Plan Provider: Towanda Malkin, MD     03/03/2020 Endocrine/Hormone Therapy    OP LEUPROLIDE (ELIGARD) 45 MG EVERY 6 MONTHS  Plan Provider: Maurie Boettcher, MD     Adenocarcinoma of esophagus (CMS-HCC)   08/31/2018 Initial Diagnosis    Adenocarcinoma of esophagus (CMS-HCC)     08/31/2018 Biopsy    EGD/Colonoscopy:  Distal esophagus: Fragment of adenocarcinoma, and fragments of gastric oxyntic mucosa with healing erosive gastritis.  Stomach antrum: Healing erosive gastritis, negative for H. pylori, dysplasia or malignancy. 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       10/11/2018 -  Cancer Staged    PET CT:  - Circumferential thickening of the distal esophagus with increased FDG uptake, likely correlating to site of known malignancy.  - Focal uptake in the prostate to the right of midline likely represents prostate malignancy.  - Left para-aortic lymph nodes with mild FDG uptake, similar in size to prior MRI. These may be related to prostate cancer given low avidity.  - Redemonstrated multifocal osseous metastases; only the L1 vertebral body metastasis is metabolically active. Note that given low avidity, at least some of these may be related to prostate cancer rather than esophageal cancer.  - FDG uptake in the right posterior gluteal musculature, likely inflammatory; associated fluid collection on prior MRI has mostly resolved.     10/24/2018 -  Cancer Staged    EUS:  Malignant esophageal tumor was found in the lower third  of the esophagus.                     - A mass was found in the lower third of the esophagus.                      This was staged T3 (early) N0 Mx by endosonographic                      criteria.                     - One benign lymph node was visualized in the middle                      paraesophageal mediastinum (level 48M).                     - No specimens collected.     11/26/2018 -  Chemotherapy    OP GI CARBOPLATIN/PACLITAXEL (XRT)  PACLItaxel 50 mg/m2 and CARBOplatin AUC 2: held last week of radiation due to low Segs/Plt       01/28/2019 -  Cancer Staged    CT Chest:    Diffuse distal esophageal wall thickening without discrete mass.  ??  No evidence of pulmonary metastases.  ??  Multilevel lytic vertebral body lesions, better characterized on previous PET/CT and abdominal MRI             02/26/2019 Biopsy    Esophagus, distal, biopsy  - Scant fragments of poorly differentiated invasive adenocarcinoma in a background of squamocolumnar mucosa with extensive ulceration, acute and chronic inflammation, and edema  - No intestinal metaplasia identified     04/16/2019 Biopsy    Gastroesophageal junction, biopsy:   - Cardio-oxyntic type and squamocolumnar junction mucosa with erosion, glandular atrophy and reactive epithelial changes  - Fragments of inflammatory granulation tissue consistent with ulcer  - No intestinal metaplasia, dysplasia or carcinoma identified       Previous therapy  Androgen deprivation with Leuprolide  Enzalutamide  Abiraterone    Current therapy  Darolutamide    Interval history    The patient returns for follow up, accompanied by son. Pt notes he is doing well. He does note that he has had sensitivity in his teeth in the lower jaw (on left side).  He has seen a dentist (Larosa Pennix [place]....[phone]) and is being referred to an oral surgeon.  He's not sure of the reason.  Otherwise, no other complaints or issues noted.    Allergies:  No Known Allergies    Current Medications:    Current Outpatient Medications:   ???  albuterol (VENTOLIN HFA) 90 mcg/actuation inhaler, Inhale 2 puffs Four (4) times a day. , Disp: , Rfl:   ???  ascorbic acid (VITAMIN C) 1000 MG tablet, Take 1,000 mg by mouth daily. , Disp: , Rfl:   ???  aspirin (ECOTRIN) 81 MG tablet, Take 81 mg by mouth. Frequency:QD   Dosage:81   MG  Instructions:  Note:Dose: 81MG , Disp: , Rfl:   ???  budesonide-formoteroL (SYMBICORT) 80-4.5 mcg/actuation inhaler, INHALE 1 PUFF BY MOUTH TWICE DAILY, Disp: , Rfl:   ???  cod liver oil Oil, Take by mouth. , Disp: , Rfl:   ???  darolutamide (NUBEQA) 300 mg tablet, Take 2 tablets (600 mg total) by mouth 2 (two) times a day with meals. Take with food. Swallow tablets whole., Disp:  120 tablet, Rfl: 11  ???  famotidine (PEPCID) 40 MG tablet, Take 1 tablet (40 mg total) by mouth nightly., Disp: 30 tablet, Rfl: 11  ???  furosemide (LASIX) 40 MG tablet, Take 1 tablet (40 mg total) by mouth daily. As needed for swelling., Disp: 30 tablet, Rfl: 11  ???  garlic 1 mg cap, Take by mouth daily. , Disp: , Rfl:   ???  ginger, zingiber officinalis, (GINGER EXTRACT) 250 mg cap, Take by mouth., Disp: , Rfl:   ???  glucosamine sulfate (GLUCOSAMINE) 500 mg Tab, Take 2 tablets by mouth once daily. , Disp: , Rfl:   ???  ibuprofen (MOTRIN) 800 MG tablet, TAKE 1 TABLET BY MOUTH EVERY 8 HOURS AS NEEDED FOR PAIN, Disp: 90 tablet, Rfl: 0  ???  LYCOPENE ORAL, Take by mouth daily. , Disp: , Rfl:   ???  multivitamin-Ca-iron-minerals Tab, Take 1 tablet by mouth., Disp: , Rfl:   ???  NON FORMULARY, 1 capsule daily. [place] , Disp: , Rfl:   ???  omeprazole (PRILOSEC) 40 MG capsule, TAKE 1 CAPSULE BY MOUTH ONCE DAILY 30 MINUTES BEFORE BREAKFAST, Disp: , Rfl:   ???  saw palmetto 160 mg cap, Take 160 mg by mouth., Disp: , Rfl:   ???  tamsulosin (FLOMAX) 0.4 mg capsule, Take 1 capsule (0.4 mg total) by mouth daily., Disp: 90 capsule, Rfl: 3  ???  UNABLE TO FIND, Med Name: Taking moringa 1,000 mg daily, Baobab po daily and Sour Sop leaves tea daily to fight cancer, Disp: , Rfl:   ???  UNABLE TO FIND, Med Name: black seed oil, Disp: , Rfl:   ???  UNABLE TO FIND, Take 1 tablet by mouth daily with evening meal. Med Name: Prostate Complete, Disp: , Rfl:   ???  UNABLE TO FIND, Beet root capsule, Disp: , Rfl:   ???  UNABLE TO FIND, Take 1 capsule by mouth daily. Med Name: pumpkin seed oil, Disp: , Rfl:   ???  vitamin E 400 UNIT capsule, Take 400 Units by mouth daily., Disp: , Rfl:     Past Medical History and Social History  Past Medical History:   Diagnosis Date   ??? Congenital vascular abnormality    ??? COPD (chronic obstructive pulmonary disease) (CMS-HCC)    ??? Impotence of organic origin    ??? Inguinal hernia without mention of obstruction or gangrene, unilateral or unspecified, (not specified as recurrent)    ??? Malignant neoplasm of prostate (CMS-HCC)     01/16/2006 Gleason's 7 ( 4  3) right face, right mid medial, right mid lateral in left mid lateral high-grade PIN.  PSA 5.7 with 6% free PSA    ??? 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 Unspecified congenital anomaly of urinary system    ??? Unspecified hemorrhoids without mention of complication       Past Surgical History:   Procedure Laterality Date   ??? HEMORRHOID SURGERY     ??? IR INSERT PORT AGE GREATER THAN 5 YRS  11/13/2018    IR INSERT PORT AGE GREATER THAN 5 YRS 11/13/2018 Maree Erie, MD IMG [facility]   ??? PR ENDOSCOPIC US EXAM, ESOPH N/A 10/31/2018    Procedure: UGI ENDOSCOPY; WITH ENDOSCOPIC ULTRASOUND EXAMINATION LIMITED TO THE ESOPHAGUS;  Surgeon: Chriss Driver, MD;  Location: [facility];  Service: Gastroenterology   ??? PR UPPER GI ENDOSCOPY,BIOPSY N/A 02/26/2019  Procedure: UGI ENDOSCOPY; WITH BIOPSY, SINGLE OR MULTIPLE;  Surgeon: Chriss Driver, MD;  Location: [facility];  Service: Gastroenterology   ??? PR UPPER GI ENDOSCOPY,BIOPSY N/A 04/16/2019    Procedure: UGI ENDOSCOPY; WITH BIOPSY, SINGLE OR MULTIPLE;  Surgeon: Chriss Driver, MD;  Location: [facility];  Service: Gastroenterology   ??? PR UPPER GI ENDOSCOPY,BIOPSY N/A 07/15/2019    Procedure: UGI ENDOSCOPY; WITH BIOPSY, SINGLE OR MULTIPLE;  Surgeon: Maris Berger, MD;  Location: [facility];  Service: Gastroenterology   ??? PR UPPER GI ENDOSCOPY,DIAGNOSIS N/A 01/21/2020    Procedure: UGI ENDO, INCLUDE ESOPHAGUS, STOMACH, & DUODENUM &/OR JEJUNUM; DX W/WO COLLECTION SPECIMN, BY BRUSH OR WASH;  Surgeon: Neysa Hotter, MD;  [facility]: [facility];  Service: Gastroenterology   ??? PROSTATE CRYOABLATION      03/23/2006 left nerve sparing   ??? Seminal Vessicle Biopsy      12/22/2009        Social History     Occupational History   ??? Not on file   Tobacco Use   ??? Smoking status: Current Some Day Smoker     Packs/day: 1.00     Years: 50.00     Pack years: 50.00     Types: Cigars   ??? Smokeless tobacco: Never Used   ??? Tobacco comment: 1 cigar a day to every two day   Vaping Use   ??? Vaping Use: Never used   Substance and Sexual Activity   ??? Alcohol use: Yes     Alcohol/week: 19.0 standard drinks     Types: 12 Cans of beer, 7 Shots of liquor per week   ??? Drug use: Not Currently     Types: Marijuana     Comment: Rarely   ??? Sexual activity: Yes     Partners: Female   Pt is married. He is accompanied by his son.     Family History  Family History   Problem Relation Age of Onset   ??? Prostate cancer Brother    ??? Heart disease Mother    ??? Diabetes Mother    ??? Diabetes Sister    ??? Hypertension Sister    ??? Heart disease Brother    ??? Sarcoidosis Son    ??? Cancer Paternal Uncle    ??? Kidney cancer Neg Hx    ??? GU problems Neg Hx    ??? Urolithiasis Neg Hx    H/o prostate cancer in brother, who is alive with cancer at age 54 currently.  Pt has a son and a daughter, aged 7 and 66.      Review of Systems:  A comprehensive review of 10 systems was negative except for pertinent positives noted in HPI.    Physical Examination:    VITAL SIGNS:  BP 137/78  - Pulse 67  - Temp 36.6 ??C (97.8 ??F) (Oral)  - Ht 175.3 cm (5' 9.02)  - Wt (!) 102.2 kg (225 lb 3.2 oz)  - SpO2 99%  - BMI 33.24 kg/m??   ECOG Performance Status:  GENERAL: Well-developed, well-nourished patient in no acute distress.  HEAD: Normocephalic and atraumatic.  EYES: Conjunctivae are normal. No scleral icterus.  MOUTH/THROAT: Oropharynx is clear and moist.  No mucosal lesions.  NECK: Supple, no thyromegaly.  LYMPHATICS: No palpable cervical, supraclavicular, or axillary adenopathy.  CARDIOVASCULAR: Normal rate, regular rhythm and normal heart sounds.  Exam reveals no gallop and no friction rub.  No murmur  heard.  PULMONARY/CHEST: Effort normal and breath sounds normal. No respiratory distress.  GASTROINTESTINAL/ABDOMINAL:  Soft. There is no distension. There is no tenderness. There is no rebound and no guarding.  MUSCULOSKELETAL: No clubbing, cyanosis, or lower extremity edema.  PSYCHIATRIC: Alert and oriented.  Normal mood and affect.  NEUROLOGIC: No focal motor deficit. Normal gait.  SKIN: Skin is warm, dry, and intact.      Results/Orders:    Lab on 03/03/2020   Component Date Value Ref Range Status   ??? PSA 03/03/2020 456.00* 0.00 - 4.00 ng/mL Final   ??? Phosphorus 03/03/2020 3.6  2.9 - 4.7 mg/dL Final   ??? Creatinine 03/03/2020 0.67* 0.70 - 1.30 mg/dL Final   ??? EGFR CKD-EPI Non-African American,* 03/03/2020 >90  >=60 mL/min/1.52m2 Final   ??? EGFR CKD-EPI African American, Male 03/03/2020 >90  >=60 mL/min/1.40m2 Final   ??? Calcium 03/03/2020 9.7  8.5 - 10.2 mg/dL Final   ??? Albumin 29/56/2130 3.8  3.5 - 5.0 g/dL Final   ??? WBC 86/57/8469 4.5  4.5 - 11.0 10*9/L Final   ??? RBC 03/03/2020 3.72* 4.50 - 5.90 10*12/L Final   ??? HGB 03/03/2020 11.3* 13.5 - 17.5 g/dL Final   ??? HCT 62/95/2841 35.6* 41.0 - 53.0 % Final   ??? MCV 03/03/2020 95.8  80.0 - 100.0 fL Final   ??? MCH 03/03/2020 30.4  26.0 - 34.0 pg Final   ??? MCHC 03/03/2020 31.7  31.0 - 37.0 g/dL Final   ??? RDW 32/44/0102 13.4  12.0 - 15.0 % Final   ??? MPV 03/03/2020 6.4* 7.0 - 10.0 fL Final   ??? Platelet 03/03/2020 310  150 - 440 10*9/L Final   ??? Neutrophils % 03/03/2020 61.6  % Final   ??? Lymphocytes % 03/03/2020 25.3  % Final   ??? Monocytes % 03/03/2020 5.9  % Final   ??? Eosinophils % 03/03/2020 5.2  % Final   ??? Basophils % 03/03/2020 0.3  % Final   ??? Absolute Neutrophils 03/03/2020 2.8  2.0 - 7.5 10*9/L Final   ??? Absolute Lymphocytes 03/03/2020 1.1* 1.5 - 5.0 10*9/L Final   ??? Absolute Monocytes 03/03/2020 0.3  0.2 - 0.8 10*9/L Final   ??? Absolute Eosinophils 03/03/2020 0.2  0.0 - 0.4 10*9/L Final   ??? Absolute Basophils 03/03/2020 0.0  0.0 - 0.1 10*9/L Final   ??? Large Unstained Cells 03/03/2020 2  0 - 4 % Final   ??? Hypochromasia 03/03/2020 Slight* Not Present Final       Lab Results   Component Value Date    PSA 456.00 (H) 03/03/2020    PSA 393.00 (H) 01/21/2020    PSA 411.00 (H) 12/31/2019    PSA 421.00 (H) 12/10/2019    PSA 384.00 (H) 11/26/2019    PSA 280.00 (H) 10/29/2019    PSA 46.30 (H) 08/28/2019    PSA 153.00 (H) 07/31/2019    PSA 155.00 (H) 06/26/2019    PSA 112.00 (H) 05/21/2019     Pre-treatment baseline PSA for darolutamide is 411 on 4/20.        Orders placed or performed in visit on 03/03/20   ??? CBC w/ Differential   ??? PSA (Prostate Specific Antigen)   ??? Phosphorus Level   ??? Creatinine   ??? Calcium   ??? Albumin   ??? CBC w/ Differential       Imaging results:  NM Bone Scan 11/03/19:  New focal increase in radiotracer uptake in multiple osseous structures with include but not limited  to: Left parietal bone, T7, T9, L1 and posterior left 11th rib.  ??  Degenerative changes noted in the bilateral shoulders, right hip, bilateral knees and right foot.  ??  Focal increase in radiotracer uptake in the mandible left of midline which may represent dental disease.  ??  Normal physiologic radiotracer uptake noted in the region of the diffuse kidney and urinary bladder.    CT CAP 11/02/19:  HEPATOBILIARY: Redemonstration of numerous hypoattenuating hepatic lesions measuring up to 2.8 cm (1:29), grossly unchanged from prior favor to represent hepatic cysts versus biliary hamartomas. Additional subcentimeter hypoattenuating lesions are too small to characterize accurately on CT but grossly unchanged from prior. The gallbladder is present and otherwise unremarkable. No biliary dilatation.    SPLEEN: Unremarkable.  PANCREAS: Unremarkable.  ??  ADRENALS: Mild thickening of the left adrenal gland, unchanged from prior. Unremarkable right adrenal gland.  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 No hydronephrosis.  ??  BLADDER: Unremarkable.  PELVIC/REPRODUCTIVE ORGANS: Heterogeneously enhancing prostate with ill-defined soft tissue posteriorly.  ??  GI TRACT: No dilated or thick walled loops of bowel. Duodenal diverticulosis. Colonic diverticulosis without evidence of diverticulitis. Normal appendix.  ??  PERITONEUM/RETROPERITONEUM AND MESENTERY: No free air or fluid.  LYMPH NODES: Enlarged retroperitoneal lymph nodes (for example, 1:54) including a lymph node conglomerate measuring up to 4.7 x 2.8 cm (1:66). An additional 1.6 cm enlarged left internal iliac lymph node (1.5).  ??  VESSELS: The aorta is normal in caliber.  No significant calcified atherosclerotic disease. The portal venous system is patent. The hepatic veins and IVC are unremarkable.  ??  BONES AND SOFT TISSUES: Grossly unchanged lytic lesion of the L1 vertebral body (1:46). Other previously noted osseous metastases seen on prior MRI are not well evaluated here. No new suspicious lytic or blastic osseous lesions. Multilevel degenerative changes of the spine. Bilateral small fat-containing inguinal hernias.

## 2020-03-03 NOTE — Unmapped (Addendum)
Lab Results   Component Value Date    PSA 393.00 (H) 01/21/2020    PSA 411.00 (H) 12/31/2019    PSA 421.00 (H) 12/10/2019    PSA 384.00 (H) 11/26/2019    PSA 280.00 (H) 10/29/2019    PSA 46.30 (H) 08/28/2019   Continue with the current medication.  Hormone injection today. We may need to get the scans, if PSA is increasing.    No Xgeva today, because of the concern about the jaw bone, for which you are seeing an oral surgeon.    Please call [phone] to reach my nurse navigator Mauricia Area for any issues.    For emergencies on Nights, Weekends and Holidays  Call [phone] and ask for the hematology/oncology on call.    Griffin Basil, MD, PhD  Associate Professor of Medicine  Division of Hematology-Oncology    [facility]  [facility]  Nurse Navigator: Mauricia Area  Fax: [phone]

## 2020-03-04 NOTE — Unmapped (Signed)
Called pt to let him per Dr. Philomena Course his PSA went up a bit from 393 to 456. Dr. Philomena Course wanted to let him know he would order scans at his next visit.Pt verbalizes understanding and will call back with any further questions or concerns.

## 2020-03-06 NOTE — Unmapped (Signed)
Returned call to pt. He was requesting to speak with NN Boneta Lucks. He states he has been taking Nubeqa since April 2021. His PSA now is rising. He was on [place] previously. He wanted to ask team if they felt he should go back on Xtandi?    Let him know I would relay his concerns to team for their recommendation.

## 2020-03-06 NOTE — Unmapped (Signed)
Hi,     Patient contacted the Communication Center regarding the following:    - Concerns regarding medication that drops the PSA is making their PSA higher and not changing anything, would like to discuss with Boneta Lucks.    Please contact patient at [phone].    Thanks in advance,    Christell Faith  [facility] Communication Center   [phone]

## 2020-03-06 NOTE — Unmapped (Signed)
Called to let pt know Dr. Philomena Course does not feel Brandon Olsen will help now in his situation but if pt wants to go back on it he can. He recommends docetaxel which pt has not wanted to try or can stay on current therapy which he felt would not work for long.     Pt states he will continue nubeqa for now and will think about his options.He will call back if he decides to try another treatment.He is not scheduled to come back to see Dr. Philomena Course until 06-09-20.

## 2020-03-09 MED FILL — NUBEQA 300 MG TABLET: 30 days supply | Qty: 120 | Fill #2 | Status: AC

## 2020-03-09 MED FILL — NUBEQA 300 MG TABLET: ORAL | 30 days supply | Qty: 120 | Fill #2

## 2020-03-26 NOTE — Unmapped (Signed)
[facility] Refill Coordination Note    Specialty Medication(s) to be Shipped:   Hematology/Oncology: Nubeqa    Other medication(s) to be shipped: n/a     Brandon Olsen, DOB: 13-May-1946  Phone: [phone] (home) [phone] (work)      All above HIPAA information was verified with patient.     Was a Nurse, learning disability used for this call? No    Completed refill call assessment today to schedule patient's medication shipment from the [facility] [phone]).       Specialty medication(s) and dose(s) confirmed: Regimen is correct and unchanged.   Changes to medications: Brandon Olsen reports no changes at this time.  Changes to insurance: No  Questions for the pharmacist: No    Confirmed patient received Welcome Packet with first shipment. The patient will receive a drug information handout for each medication shipped and additional FDA Medication Guides as required.       DISEASE/MEDICATION-SPECIFIC INFORMATION        N/A    SPECIALTY MEDICATION ADHERENCE     Medication Adherence    Patient reported X missed doses in the last month: 0  Specialty Medication: Nubeqa 300mg   Patient is on additional specialty medications: No  Informant: patient                Nubeqa 300 mg: 14 days of medicine on hand         SHIPPING     Shipping address confirmed in Epic.     Delivery Scheduled: Yes, Expected medication delivery date: 04/07/20.     Medication will be delivered via Same Day Courier to the prescription address in Epic [place].    Brandon Olsen Brandon Olsen   [facility] Specialty Technician

## 2020-04-04 NOTE — Unmapped (Signed)
I spoke with pt- he is doing well. He stated his shoulder gets hot, but it goes away. I told him to just keep an eye on it. He will call me if he has any issues prior to his next appt.

## 2020-04-06 ENCOUNTER — Institutional Professional Consult (permissible substitution)
Admit: 2020-04-06 | Discharge: 2020-04-07 | Payer: MEDICARE | Attending: Pharmacist Clinician (PhC)/ Clinical Pharmacy Specialist | Primary: Pharmacist Clinician (PhC)/ Clinical Pharmacy Specialist

## 2020-04-06 NOTE — Unmapped (Signed)
Clinical Pharmacist Practitioner: [facility]- Telephone Encounter    I spent 25 minutes on the phone with the patient on the date of service. I spent an additional 15 minutes on pre- and post-visit activities on the date of service.     The patient was physically located in [place] or a state in which I am permitted to provide care. The patient and/or parent/guardian understood that s/he may incur co-pays and cost sharing, and agreed to the telemedicine visit. The visit was reasonable and appropriate under the circumstances given the patient's presentation at the time.    The patient and/or parent/guardian has been advised of the potential risks and limitations of this mode of treatment (including, but not limited to, the absence of in-person examination) and has agreed to be treated using telemedicine. The patient's/patient's family's questions regarding telemedicine have been answered.     If the visit was completed in an ambulatory setting, the patient and/or parent/guardian has also been advised to contact their provider???s office for worsening conditions, and seek emergency medical treatment and/or call 911 if the patient deems either necessary.      Patient Name: Brandon Olsen  Patient Age: 74 y.o.    Assessment and recommendations:  A 74 y.o. 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 Dx with Gleason 4+3=7 prostate cancer treated with cryoablation then documented bone mets in 2011 treated with ADT, progressed to CRPC 01/2017 asymptomatic. Subsequently treated with enzalutamide + ADT with progression and most recently abiraterone/pred/ADT 05/21/19 now with biochemical and scan progression with new lesions in T7, T12, and increase in pelvic and RP LAD. He has had clear progression of disease by both PSA and imaging (bone and pelvic/RP LAD), he is asymptomatic from his disease despite a PSA of 384 and clear bone metastases. Discussion was had about docetaxel, he has no neuropathy from previous Taxol and tolerated carbo/Taxol quite well during treatment for esophageal adenocarcinoma. Patient decided against chemotherapy so will continue AR directed therapy with darolutamide.    1. Castration Resistant Prostate Cancer with Bone and Pelvic/RP LN Metastases- started darolutamide on 01/10/20. Has tolerated well with no new or worsening AR associated side effects. Baseline PSA 411, most recent 456 on 6/22. Discussed the plan to continue darolutamide for now, he will call if he develops any new or concerning symptoms.   - Will continue darolutamide 600 mg BID  - Continue with ADT, Lupron 45 mg given last 622/21; next due 08/30/20 or later  - Germline testing: negative 02/16/18  ??  2. Esophageal Adenocarcinoma, Stage III T3N0: s/p definitive chemoradiation (carbo/Taxol) per CROSS trial. Most recent EGD 07/15/19 with NED. Per NCCN, needs EGD every 3-6 months for two years then every 6 months for the third year then as clinically indicated. Has an EGD coming up.     3. 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   - Recommended calcium citrate plus vitamin D as patient is on PPI  - Discontinued for now pending eval for possible ONJ. Reports sensitivity with gums, oral surgeon will evaluate 7/28    4. Medication Management- reviewed and updated medication list including all supplements. Discussed risk benefit of taking multiple supplements. Also reports not being able to afford some of his prescription medications. Discussed DDI potential with CBD, moringa and saw palmetto. He is taking 12 supplements per day in addition to prescription medications.  - Recommended discontinuing the  bulk of supplements except aspirin, calcium/vit D and multivitamin, but patient thinks benefits outweigh risks.     Follow- up: Dr Lamont Dowdy on 06/09/20 ______________________________________________________________________    Reason for visit:  Darolutamide monitoring and side effect management    Current Drug and Dose: Darolutamide 600 mg BID    Date of Initiation: 01/10/2020    Oral Agent Toxicities:  none    Interval History:  Feeling well today. He has regular arthritis pains but nothing new since starting daroluatmide. 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 Some days are better than others. He reports some hot flashes but nothing bothersome. He has no change in his baseline ADT symptoms.      Adherence: Taking 2 tablets twice daily with food, no missed doses    Drug Interactions: potential DDI with supplements    Oncology History Overview Note   - 2007, diagnosed with prostate cancer, Gleason 4+3=7, PSA 5.7. Treated with cryoablation.  - 02/2010, PET scan showed bone mets in femur and sacrum. Started on hormonal therapy with Lupron.  - 10/2016, PSA nadir of 0.38.  - 01/2017, second consecutive PSA rise to 2.21, CRPC progression documented.  - 4 /2019, PSA 44.2. CT and MRI showed borderline retroperitoneal and pelvic adenopathy and bone mets. Started enzalutamide + ADT.  - 9/19, PSA nadir 33.4  - 9/20, PSA 112, switched to abiraterone/pred/ADT  - 12/20, PSA nadir 46.3  - 2/21, PSA 384, BS with L frontal bone, multiple new vertebral mets and L 11th rib, CT with increase in pelvic and RP LAD  - 3/21, stopped abiraterone/prednisone, with recommendation to start docetaxel. Pt re-started enzalutamide on his own  - 5/21, enzalutamide stopped and darolutamide started     Malignant neoplasm of prostate (CMS-HCC)   01/13/2006 Biopsy    Gleason's 7 ( 4  3) right face, right mid medial, right mid lateral in left mid lateral high-grade PIN.  PSA 5.7 with 6% free PSA      01/24/2006 -  Cancer Staged    Bone scan:  No evidence of metastatic disease     02/02/2006 -  Cancer Staged    CT AP:  Horseshoe kidney present     03/22/2006 Surgery    Cryoablation     12/02/2009 -  Cancer Staged    MRI Pelvis:  No definite abnormality noted within the prostate.   2. Possible rectal lesion along the anterior wall of the rectum. Direct   visualization of the rectum is suggested for further evaluation.      02/25/2010 -  Cancer Staged    PET scan:  Findings concerning for bony metastatic involvement in the anterior right   proximal femur and in the left side of the sacrum as described. Other   areas   are in regions of degenerative changes especially in the lumbar and   cervical   spine areas.     10/15/2014 - 12/26/2017 Chemotherapy    Bicalutamide     09/01/2017 -  Cancer Staged    CT AP:  Bilateral prominent subcentimeter pelvic sidewall lymph nodes which are indeterminate but early metastatic disease cannot be excluded. Attention on follow-up.    Multiple hepatic hypodensities, these have CT appearance of simple cysts and biliary hamartomas. This could be further evaluated with MRI.    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    A  2.6 cm hypodense lesion is detected in the subcutaneous tissues of the right gluteal region. This lesion centrally demonstrates low attenuation close to water. Could be the sequela of fat necrosis or a small fluid collection secondary to injection. The lesion demonstrates no obvious rim enhancement or associated stranding. Correlation with clinical findings is recommended to exclude an abscess.  ??     09/01/2017 -  Cancer Staged    Bone scan: No evidence of osseous metastatic disease         12/12/2017 -  Cancer Staged    MRI AP:  --Irregular prostate mass with extracapsular extension consistent with primary malignancy.  --Metastatic bilateral iliac and lower para-aortic lymphadenopathy.  --Vertebral and pelvic bone metastases.  --Peripherally enhancing fluid collection in the lateral aspect of the right gluteus maximus is favored to be posttraumatic in nature. Abscess could have this appearance in appropriate clinical setting.     12/12/2017 -  Cancer Staged    LD Screening Chest CT:  New lytic lesions in the thoracic spine, compatible with metastases, as suggested on same-day MRI of the abdomen and pelvis.    -No pulmonary nodules identified.     12/25/2017 -  Chemotherapy    Xtandi     02/16/2018 -  Cancer Staged    Prostate panel: No mutations identified. Tested genes: ATM, BRCA1, BRCA2, CHEK2, EPCAM*, MLH1, MSH2, MSH6, NBN, PMS2, TP53, HOXB13 (sequence change only)       02/27/2018 Endocrine/Hormone Therapy    OP LEUPROLIDE (22.5 MG EVERY 3 MONTHS)  Plan Provider: Towanda Malkin, MD     06/26/2019 - 07/31/2019 Endocrine/Hormone Therapy    OP LEUPROLIDE (7.5 MG EVERY MONTH)  Plan Provider: Towanda Malkin, MD     08/28/2019 - 11/26/2019 Endocrine/Hormone Therapy    OP LEUPROLIDE (ELIGARD) 22.5 MG EVERY 3 MONTHS  Plan Provider: Towanda Malkin, MD     11/06/2019 -  Cancer Staged    CT CAP:    No pulmonary metastasis.      Unchanged thoracic vertebral body lytic lesions compared to 01/28/2019.  Interval increase in pelvic and retroperitoneal lymphadenopathy, concerning for progressive metastatic disease, likely originating from prostate.     -Ill-defined posterior soft tissue of the prostate and osseous lesions of the axial and appendicular skeleton are better evaluated on prior MRI of the abdomen and pelvis. L1 vertebral body lytic lesion look unchanged today compared to 10/11/2018 PET/CT.     -Additional chronic and incidental findings as above.     11/06/2019 -  Cancer Staged    Bone scan:  New focal increase in radiotracer uptake in multiple osseous structures such as the left frontal bone, multiple thoracic/lumbar vertebrae and left 11th rib. These are suspicious for potential sites of metastatic disease however degenerative/posttraumatic changes cannot be excluded. R     11/26/2019 - 11/26/2019 Endocrine/Hormone Therapy    OP LEUPROLIDE (LUPRON) 22.5 MG EVERY 3 MONTHS  Plan Provider: Towanda Malkin, MD     03/03/2020 Endocrine/Hormone Therapy    OP LEUPROLIDE (ELIGARD) 45 MG EVERY 6 MONTHS  Plan Provider: Maurie Boettcher, MD     Adenocarcinoma of esophagus (CMS-HCC)   08/31/2018 Initial Diagnosis    Adenocarcinoma of esophagus (CMS-HCC)     08/31/2018 Biopsy    EGD/Colonoscopy:  Distal esophagus: Fragment of adenocarcinoma, and fragments of gastric oxyntic mucosa with healing erosive gastritis.  Stomach antrum: Healing erosive gastritis, negative for H. pylori, dysplasia or malignancy. 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  10/11/2018 -  Cancer Staged    PET CT:  - Circumferential thickening of the distal esophagus with increased FDG uptake, likely correlating to site of known malignancy.  - Focal uptake in the prostate to the right of midline likely represents prostate malignancy.  - Left para-aortic lymph nodes with mild FDG uptake, similar in size to prior MRI. These may be related to prostate cancer given low avidity.  - Redemonstrated multifocal osseous metastases; only the L1 vertebral body metastasis is metabolically active. Note that given low avidity, at least some of these may be related to prostate cancer rather than esophageal cancer.  - FDG uptake in the right posterior gluteal musculature, likely inflammatory; associated fluid collection on prior MRI has mostly resolved.     10/24/2018 -  Cancer Staged    EUS:  Malignant esophageal tumor was found in the lower third                      of the esophagus.                     - A mass was found in the lower third of the esophagus.                      This was staged T3 (early) N0 Mx by endosonographic                      criteria.                     - One benign lymph node was visualized in the middle                      paraesophageal mediastinum (level 56M).                     - No specimens collected.     11/26/2018 -  Chemotherapy    OP GI CARBOPLATIN/PACLITAXEL (XRT) PACLItaxel 50 mg/m2 and CARBOplatin AUC 2: held last week of radiation due to low Segs/Plt       01/28/2019 -  Cancer Staged    CT Chest:    Diffuse distal esophageal wall thickening without discrete mass.  ??  No evidence of pulmonary metastases.  ??  Multilevel lytic vertebral body lesions, better characterized on previous PET/CT and abdominal MRI             02/26/2019 Biopsy    Esophagus, distal, biopsy  - Scant fragments of poorly differentiated invasive adenocarcinoma in a background of squamocolumnar mucosa with extensive ulceration, acute and chronic inflammation, and edema  - No intestinal metaplasia identified     04/16/2019 Biopsy    Gastroesophageal junction, biopsy:   - Cardio-oxyntic type and squamocolumnar junction mucosa with erosion, glandular atrophy and reactive epithelial changes  - Fragments of inflammatory granulation tissue consistent with ulcer  - No intestinal metaplasia, dysplasia or carcinoma identified         Vital Signs for this encounter:  There were no vitals taken for this visit.  Wt Readings from Last 3 Encounters:   03/03/20 (!) 102.2 kg (225 lb 3.2 oz)   02/13/20 (!) 101.7 kg (224 lb 1.6 oz)   01/21/20 (!) 102.7 kg (226 lb 6.4 oz)       Medications:  Current Outpatient Medications   Medication Sig Dispense Refill   ??? albuterol (VENTOLIN HFA) 90  mcg/actuation inhaler Inhale 2 puffs Four (4) times a day.      ??? ascorbic acid (VITAMIN C) 1000 MG tablet Take 1,000 mg by mouth daily.      ??? aspirin (ECOTRIN) 81 MG tablet Take 81 mg by mouth. Frequency:QD   Dosage:81   MG  Instructions:  Note:Dose: 81MG      ??? budesonide-formoteroL (SYMBICORT) 80-4.5 mcg/actuation inhaler INHALE 1 PUFF BY MOUTH TWICE DAILY     ??? cod liver oil Oil Take by mouth.      ??? darolutamide (NUBEQA) 300 mg tablet Take 2 tablets (600 mg total) by mouth 2 (two) times a day with meals. Take with food. Swallow tablets whole. 120 tablet 11   ??? famotidine (PEPCID) 40 MG tablet Take 1 tablet (40 mg total) by mouth nightly. 30 tablet 11   ??? furosemide (LASIX) 40 MG tablet Take 1 tablet (40 mg total) by mouth daily. As needed for swelling. 30 tablet 11   ??? garlic 1 mg cap Take by mouth daily.      ??? ginger, zingiber officinalis, (GINGER EXTRACT) 250 mg cap Take by mouth.     ??? glucosamine sulfate (GLUCOSAMINE) 500 mg Tab Take 2 tablets by mouth once daily.      ??? ibuprofen (MOTRIN) 800 MG tablet TAKE 1 TABLET BY MOUTH EVERY 8 HOURS AS NEEDED FOR PAIN 90 tablet 0   ??? LYCOPENE ORAL Take by mouth daily.      ??? multivitamin-Ca-iron-minerals Tab Take 1 tablet by mouth.     ??? NON FORMULARY 1 capsule daily. [place]      ??? omeprazole (PRILOSEC) 40 MG capsule TAKE 1 CAPSULE BY MOUTH ONCE DAILY 30 MINUTES BEFORE BREAKFAST     ??? saw palmetto 160 mg cap Take 160 mg by mouth.     ??? tamsulosin (FLOMAX) 0.4 mg capsule Take 1 capsule (0.4 mg total) by mouth daily. 90 capsule 3   ??? UNABLE TO FIND Med Name: Taking moringa 1,000 mg daily, Baobab po daily and Sour Sop leaves tea daily to fight cancer     ??? UNABLE TO FIND Med Name: black seed oil     ??? UNABLE TO FIND Take 1 tablet by mouth daily with evening meal. Med Name: Prostate Complete     ??? UNABLE TO FIND Beet root capsule     ??? UNABLE TO FIND Take 1 capsule by mouth daily. Med Name: pumpkin seed oil     ??? vitamin E 400 UNIT capsule Take 400 Units by mouth daily.       No current facility-administered medications for this visit.       LABS:  Lab Results   Component Value Date    WBC 4.5 03/03/2020    HGB 11.3 (L) 03/03/2020    HCT 35.6 (L) 03/03/2020    PLT 310 03/03/2020       Lab Results   Component Value Date    NA 137 12/31/2019    K 4.4 12/31/2019    CL 100 12/31/2019    CO2 28.0 12/31/2019    BUN 15 12/31/2019    CREATININE 0.67 (L) 03/03/2020    GLU 96 12/31/2019    CALCIUM 9.7 03/03/2020    MG 1.8 12/31/2018    PHOS 3.6 03/03/2020       Lab Results   Component Value Date    BILITOT 0.7 12/31/2019    BILIDIR 0.10 12/10/2019    PROT 6.8 12/31/2019    ALBUMIN 3.8 03/03/2020  ALT 13 12/31/2019 AST 27 12/31/2019    ALKPHOS 59 12/31/2019     I spent 25 minutes in direct patient care.    Laverna Peace PharmD, BCOP, CPP  Hematology/Oncology Pharmacist  P: [phone]

## 2020-04-07 MED FILL — NUBEQA 300 MG TABLET: ORAL | 30 days supply | Qty: 120 | Fill #3

## 2020-04-07 MED FILL — NUBEQA 300 MG TABLET: 30 days supply | Qty: 120 | Fill #3 | Status: AC

## 2020-04-22 NOTE — Unmapped (Signed)
Attempted to return call. No answer or VM

## 2020-04-22 NOTE — Unmapped (Signed)
Hi,     Brandon Olsen has contacted the Communication Center in regards to the following symptom:     Pain: new onset, headache, itching sensation    Please contact Mr. Blanchfield at [phone]    A page or telephone call has been made to the corresponding clinic.     Thank you,  Kelli Hope   [facility] Communication Center   [phone]

## 2020-04-22 NOTE — Unmapped (Signed)
Hi,     Patient contacted the Communication Center regarding the following:    - Returning missed call from [place].    Please contact patient at [phone].    Thanks in advance,    Laverna Peace  [facility] Communication Center   [phone]

## 2020-04-22 NOTE — Unmapped (Signed)
Returned call to pt. States he had Xgeva shot on 01-21-20 while under the care of Dr. Claude Manges. When he saw Dr. Philomena Course on 03-03-20 he held the xgeva as pt had an appt to see an oral Careers adviser. States his dentist Dr. Spero Curb sent him to a Transport planner. Oral surgeon did not find any issues and has sent him back to be seen by Dr. Spero Curb in the morning.    Pt states he continues to have aching in his jaw and sometimes causes him to have a slight headache.He takes Ibuprofen 800 mg which helps some but continues to bother him. Describes as a nagging ache.Denies any oen wounds or sores in mouth.    Let him know I will notify team and see what they recommend. He will let use know of any issues dentist finds tomorrow.

## 2020-04-24 MED ORDER — MUPIROCIN 2 % TOPICAL OINTMENT
0 days
Start: 2020-04-24 — End: ?

## 2020-04-24 MED ORDER — NYSTATIN 100,000 UNIT/ML ORAL SUSPENSION
0 days
Start: 2020-04-24 — End: ?

## 2020-04-27 NOTE — Unmapped (Signed)
Hi,     Patient has contacted the Communication Center in regards to the following symptom:     Mouth sores/pain in mouth: unable to eat or drink and Pain: worsening    Please contact patient at [phone].    Check Indicates criteria has been reviewed and confirmed with the patient:    [x]  Preferred Name   [x]  DOB and/or MR#  [x]  Preferred Contact Method  [x]  Phone Number(s)   []  MyChart     A page or telephone call has been made to the corresponding clinic.     Thank you,  Christell Faith   [facility] Communication Center   [phone]

## 2020-04-27 NOTE — Unmapped (Signed)
Returned call to pt. States he saw dentist last week after seeing oral Careers adviser.Neither found any dental problems that would be causing right lower jaw pain/aching.States x-rays were done.    He states the aching continues.Rates pain 8-9/10 at times.Takes Ibuprofen and Tylenol which helps for a while but then the pain returns.Has not effected him eating.Denies any redness, sores, or swelling.States he does not want to hurt all the time.     He is not scheduled to see Dr. Philomena Course for visit and labs until 9-28. Let him know I would relay message to team for any recommendations.

## 2020-04-28 NOTE — Unmapped (Signed)
[facility] Refill Coordination Note    Specialty Medication(s) to be Shipped:   Hematology/Oncology: Nubeqa    Other medication(s) to be shipped: No additional medications requested for fill at this time     Altair Appenzeller, DOB: 1946-07-28  Phone: [phone] (home) [phone] (work)      All above HIPAA information was verified with patient.     Was a Nurse, learning disability used for this call? No    Completed refill call assessment today to schedule patient's medication shipment from the [facility] [phone]).       Specialty medication(s) and dose(s) confirmed: Regimen is correct and unchanged.   Changes to medications: Knoah reports no changes at this time.  Changes to insurance: No  Questions for the pharmacist: No    Confirmed patient received Welcome Packet with first shipment. The patient will receive a drug information handout for each medication shipped and additional FDA Medication Guides as required.       DISEASE/MEDICATION-SPECIFIC INFORMATION        N/A    SPECIALTY MEDICATION ADHERENCE     Medication Adherence    Patient reported X missed doses in the last month: 0  Specialty Medication: Nubeqa 300mg   Patient is on additional specialty medications: No  Informant: patient                Nubeqa 300 mg: 11 days of medicine on hand         SHIPPING     Shipping address confirmed in Epic.     Delivery Scheduled: Yes, Expected medication delivery date: 05/05/20.     Medication will be delivered via Same Day Courier to the prescription address in Epic [place].    [name]   [facility] Specialty Technician

## 2020-05-01 NOTE — Unmapped (Signed)
05/01/20  Per Onc chart, pt has ONJ osteonecrotosis of the jaw, which is likely the cause of his pain.  Attempted to call, for an update on the amount of motrin and tylenol currently taking.     Current med list:  Motrin 800 mg q 8hrs as needed  Ecotrin 81 mg daily.  (Tylenol not on med list)     Left VM to call if no improvement.    Lucia Gaskins RN   Per diem NN

## 2020-05-05 MED FILL — NUBEQA 300 MG TABLET: 30 days supply | Qty: 120 | Fill #4 | Status: AC

## 2020-05-05 MED FILL — NUBEQA 300 MG TABLET: ORAL | 30 days supply | Qty: 120 | Fill #4

## 2020-05-05 NOTE — Unmapped (Signed)
Called and spoke with [facility]. States they sent referral to [facility]- Dr. Clide Deutscher.Per pt , there was nothing dental found that would cause the jaw pain he is complaining of.He was sent sent back to Dr. Lucretia Kern. They have now referred him to [facility] in [place] but does not think pt has seen them yet. Also made a referral to ENT.    Requested they send documentation of findings to our team via fax [phone] Attn:Holly Eakes. States they will send the requested documentation.

## 2020-05-07 MED ORDER — TRAMADOL 50 MG TABLET
Freq: Four times a day (QID) | ORAL | 0 days | PRN
Start: 2020-05-07 — End: ?

## 2020-05-08 NOTE — Unmapped (Signed)
Returned call to pt. States he was seen by [facility] [place]. They did not find anything.    He was seen yesterday by his PCP Dr. Maryjane Hurter who did some jaw x-rays and prescribed tramadol.He also states some family members have had hand , foot , and mouth syndrome symptoms.     He will follow up with PCP after they receive x-ray results.He just wanted to let team know of his status.Let him know I will relay his info and call him back with any further recommendations.

## 2020-05-11 MED ORDER — HYDROCODONE 5 MG-ACETAMINOPHEN 325 MG TABLET
Freq: Four times a day (QID) | ORAL | 0.00000 days | PRN
Start: 2020-05-11 — End: ?

## 2020-05-12 NOTE — Unmapped (Signed)
Hi,     Oluwatimileyin has contacted the Communication Center in regards to the following symptom:     Pain: new onset     Aching pain in his jaw and puffiness in the middle of his chin.  Not sleeping due to it and very tired.    Please contact at [phone]    Check Indicates criteria has been reviewed and confirmed with the patient:    []  Preferred Name   [x]  DOB and/or MR#  [x]  Preferred Contact Method  [x]  Phone Number(s)   []  MyChart     A page or telephone call has been made to the corresponding clinic.     Thank you,  Vernie Ammons   [facility] Cancer Communication Center   [phone]

## 2020-05-12 NOTE — Unmapped (Signed)
Returned call to pt. States he continues to experience soreness/pain in middle of jaw bone.He states he is alternating Tylenol and Ibuprofen which does help.States it is keeping him up at night.    Pt has been evaluated by his dentist, oral surgeon, and saw his PCP last Thursday. He was given Tramadol but states it has not helped.     Recommended he follow up with Dr. Maryjane Hurter for results from jaw x-rays and let him know Tramadol does not seem to be helping.States he will call Dr. Maryjane Hurter. Let him know I will also notify team and let him know of any further recommendations.

## 2020-05-15 ENCOUNTER — Other Ambulatory Visit: Payer: Self-pay | Admitting: Family Medicine

## 2020-05-15 DIAGNOSIS — R6884 Jaw pain: Secondary | ICD-10-CM

## 2020-05-22 NOTE — Unmapped (Signed)
[facility] Refill Coordination Note    Specialty Medication(s) to be Shipped:   Hematology/Oncology: Nubeqa 300mg     Other medication(s) to be shipped: No additional medications requested for fill at this time     Brandon Olsen, DOB: Feb 15, 1946  Phone: [phone] (home) [phone] (work)      All above HIPAA information was verified with patient.     Was a Nurse, learning disability used for this call? No    Completed refill call assessment today to schedule patient's medication shipment from the [facility] [phone]).       Specialty medication(s) and dose(s) confirmed: Regimen is correct and unchanged.   Changes to medications: Crosley reports no changes at this time.  Changes to insurance: No  Questions for the pharmacist: No    Confirmed patient received Welcome Packet with first shipment. The patient will receive a drug information handout for each medication shipped and additional FDA Medication Guides as required.       DISEASE/MEDICATION-SPECIFIC INFORMATION        N/A    SPECIALTY MEDICATION ADHERENCE     Medication Adherence    Patient reported X missed doses in the last month: 0  Specialty Medication: Nubeqa 300mg   Patient is on additional specialty medications: No  Informant: patient                Nubeqa 300 mg: 16 days of medicine on hand         SHIPPING     Shipping address confirmed in Epic.     Delivery Scheduled: Yes, Expected medication delivery date: 06/04/20.     Medication will be delivered via Next Day Courier to the prescription address in [employer] [place].    [name]   [facility] Specialty Technician

## 2020-05-25 ENCOUNTER — Other Ambulatory Visit: Payer: Self-pay

## 2020-05-25 ENCOUNTER — Ambulatory Visit
Admission: RE | Admit: 2020-05-25 | Discharge: 2020-05-25 | Disposition: A | Discharge status: Home / Self Care | Payer: Medicare Other | Source: Ambulatory Visit | Attending: Family Medicine | Admitting: Family Medicine

## 2020-05-25 DIAGNOSIS — R6884 Jaw pain: Secondary | ICD-10-CM | POA: Insufficient documentation

## 2020-05-27 NOTE — Unmapped (Signed)
Lendell Caprice contacted the [employer] requesting to speak with the care team of Elvan Ebron to discuss:    Patient called to speak to Dr. Philomena Course about imaging results.    Please contact Filbert at [phone].        Check Indicates criteria has been reviewed and confirmed with the patient:    [x]  Preferred Name   [x]  DOB and/or MR#  [x]  Preferred Contact Method  [x]  Phone Number(s)   []  MyChart     Thank you,   Jacques Navy  [facility] Cancer Communication Center   [phone]

## 2020-05-27 NOTE — Unmapped (Signed)
Returned call to Brandon Olsen. States he had CT done for continued jaw pain yesterday.States a pathologic fracture was noted.      Per care everywhere pt's dentist Dr. Deedra Ehrich has referred pt to Dr. Metta Clines at the [facility]. Pt has not been notified of appt date yet. He wanted to make sure Dr. Philomena Course knew of results to see if he had any further recommendations.Let him know I will forward the info.

## 2020-06-01 NOTE — Unmapped (Signed)
Returned call to Oriental. States he has appt tomorrow with Dr. Metta Clines at the [facility].Pt will ask Dr. Metta Clines to send note of findings to [facility]. Will all back with any further needs.

## 2020-06-01 NOTE — Unmapped (Signed)
Attempted to return call. No answer or V/M available.

## 2020-06-01 NOTE — Unmapped (Signed)
Hi,     Brandon Olsen contacted the [employer] requesting to speak with the care team of Brandon Olsen to discuss:    Patient scheduled for an x-ray tomorrow, wanted to get clearance from oncology team. States he's seeing a provider named Dr. Thayer Ohm in [facility] tomorrow.    Please contact Brandon Olsen at [phone].    Thank you,   Kelli Hope  [facility] Cancer Communication Center   [phone]

## 2020-06-03 MED ORDER — DAROLUTAMIDE 300 MG TABLET
0 days
Start: 2020-06-03 — End: ?

## 2020-06-03 MED FILL — NUBEQA 300 MG TABLET: ORAL | 30 days supply | Qty: 120 | Fill #5

## 2020-06-03 MED FILL — NUBEQA 300 MG TABLET: 30 days supply | Qty: 120 | Fill #5 | Status: AC

## 2020-06-03 NOTE — Unmapped (Signed)
Returned call to pt. States he saw Dr. Metta Clines yesterday at the [facility].Dr. Metta Clines wants to do a bone bx of jaw next week.Pt wants to wait and discuss with Dr. Philomena Course or have Dr. Philomena Course discuss with Dr. Metta Clines.Pt is scheduled to see Dr. Philomena Course next Tuesday.    Let him know I will relay message to the team.

## 2020-06-03 NOTE — Unmapped (Signed)
Hi,     Paulette Lynch contacted the [employer] requesting to speak with the care team of Brandon Olsen to discuss:    Patient states his dentist sent him to Dr. Metta Clines in [place]. Another biopsy was performed, wanted to get part of bone sent for analysis.     Please contact Mr. Lollar at [phone].      Thank you,   Kelli Hope  [facility] Cancer Communication Center   [phone]

## 2020-06-05 NOTE — Unmapped (Signed)
Hi Dr. Philomena Course,    Dr. Leretha Dykes with [facility] has contacted the Communication Center requesting to speak with you directly regarding the following:    Requesting to speak with you regarding a mutual patient before the start of care at the [facility].     Dr. Leretha Dykes is available for a call back on June 11, 2020 at 1 pm.  The best number to call back is [phone]    A page has also been sent.    Thank you,  Jannette Spanner  [facility] Cancer Communication Center  [phone]

## 2020-06-09 ENCOUNTER — Ambulatory Visit
Admit: 2020-06-09 | Discharge: 2020-06-10 | Payer: MEDICARE | Attending: Hematology & Oncology | Primary: Hematology & Oncology

## 2020-06-09 ENCOUNTER — Other Ambulatory Visit: Admit: 2020-06-09 | Discharge: 2020-06-10 | Payer: MEDICARE

## 2020-06-09 DIAGNOSIS — C159 Malignant neoplasm of esophagus, unspecified: Principal | ICD-10-CM

## 2020-06-09 DIAGNOSIS — C61 Malignant neoplasm of prostate: Principal | ICD-10-CM

## 2020-06-09 DIAGNOSIS — C7951 Secondary malignant neoplasm of bone: Principal | ICD-10-CM

## 2020-06-09 LAB — CBC W/ AUTO DIFF
BASOPHILS ABSOLUTE COUNT: 0 10*9/L (ref 0.0–0.1)
BASOPHILS RELATIVE PERCENT: 0.4 %
EOSINOPHILS ABSOLUTE COUNT: 0.3 10*9/L (ref 0.0–0.4)
EOSINOPHILS RELATIVE PERCENT: 5.5 %
HEMATOCRIT: 33.2 % — ABNORMAL LOW (ref 41.0–53.0)
HEMOGLOBIN: 10.9 g/dL — ABNORMAL LOW (ref 13.5–17.5)
LARGE UNSTAINED CELLS: 3 % (ref 0–4)
LYMPHOCYTES ABSOLUTE COUNT: 0.9 10*9/L — ABNORMAL LOW (ref 1.5–5.0)
LYMPHOCYTES RELATIVE PERCENT: 16.5 %
MEAN CORPUSCULAR HEMOGLOBIN CONC: 32.7 g/dL (ref 31.0–37.0)
MEAN CORPUSCULAR HEMOGLOBIN: 30.2 pg (ref 26.0–34.0)
MEAN CORPUSCULAR VOLUME: 92.4 fL (ref 80.0–100.0)
MEAN PLATELET VOLUME: 7.4 fL (ref 7.0–10.0)
MONOCYTES ABSOLUTE COUNT: 0.3 10*9/L (ref 0.2–0.8)
MONOCYTES RELATIVE PERCENT: 5.1 %
NEUTROPHILS ABSOLUTE COUNT: 4 10*9/L (ref 2.0–7.5)
NEUTROPHILS RELATIVE PERCENT: 69.6 %
PLATELET COUNT: 419 10*9/L (ref 150–440)
RED BLOOD CELL COUNT: 3.59 10*12/L — ABNORMAL LOW (ref 4.50–5.90)
RED CELL DISTRIBUTION WIDTH: 15.4 % — ABNORMAL HIGH (ref 12.0–15.0)
WBC ADJUSTED: 5.7 10*9/L (ref 4.5–11.0)

## 2020-06-09 LAB — ALBUMIN: ALBUMIN: 3.4 g/dL (ref 3.4–5.0)

## 2020-06-09 LAB — CREATININE
CREATININE: 0.74 mg/dL
EGFR CKD-EPI AA MALE: >90 mL/min/{1.73_m2} (ref >=60)
EGFR CKD-EPI NON-AA MALE: 90 mL/min/{1.73_m2} (ref >=60)

## 2020-06-09 LAB — PSA: PROSTATE SPECIFIC ANTIGEN: 925.26 ng/mL — ABNORMAL HIGH (ref 0.00–4.00)

## 2020-06-09 LAB — PHOSPHORUS: PHOSPHORUS: 3.2 mg/dL (ref 2.4–5.1)

## 2020-06-09 LAB — CALCIUM: CALCIUM: 9.5 mg/dL (ref 8.7–10.4)

## 2020-06-09 MED ADMIN — heparin, porcine (PF) 100 unit/mL injection 500 Units: 500 [IU] | INTRAVENOUS | @ 13:00:00 | Stop: 2020-06-10

## 2020-06-09 NOTE — Unmapped (Signed)
Port accessed and labs drawn with no complications.  Port flushed with saline and heparin. Needle removed.   Patient ambulatory from lab.

## 2020-06-09 NOTE — Unmapped (Signed)
Lab Results   Component Value Date    PSA 456.00 (H) 03/03/2020    PSA 393.00 (H) 01/21/2020    PSA 411.00 (H) 12/31/2019    PSA 421.00 (H) 12/10/2019    PSA 384.00 (H) 11/26/2019    PSA 280.00 (H) 10/29/2019     Bone biopsy by Dr. Metta Clines would be the next step. I will discontinue denosumab.  Otherwise, continue with darolutamide for now.    Bone scan prior to next visit.    Please call [phone] to reach my nurse navigator Joaquin Bend or Lucia Gaskins for any issues.    For emergencies on Nights, Weekends and Holidays  Call [phone] and ask for the hematology/oncology on call.    Griffin Basil, MD, PhD  Associate Professor of Medicine  Division of Hematology-Oncology    [facility]  Genitourinary Oncology Clinic  Nurse Navigator: Nigel Berthold  Fax: [phone]

## 2020-06-09 NOTE — Unmapped (Signed)
[facility] Visit Note    Patient Name: Brandon Olsen  Patient Age: 74 y.o.  Encounter Date: 06/09/2020  Attending Provider:  Young E. Philomena Course, MD  Referring physician: Self, Referred    Assessment  Patient Active Problem List   Diagnosis   ??? Impotence of organic origin   ??? Malignant neoplasm of prostate (CMS-HCC)   ??? Congenital anomaly of aorta   ??? Secondary malignant neoplasm of bone (CMS-HCC)   ??? Inguinal hernia   ??? Congenital anomaly of urinary system   ??? Chronic airway obstruction (CMS-HCC)   ??? Hyperlipidemia   ??? Gynecomastia, male   ??? Bladder outlet obstruction   ??? Adenocarcinoma of esophagus (CMS-HCC)       A 74 y.o. 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 Dx with Gleason 4+3=7 prostate cancer treated with cryoablation then documented bone mets in 2011 treated with ADT, progressed to CRPC 01/2017 asymptomatic. Subsequently treated with enzalutamide + ADT with progression and most recently abiraterone/pred/ADT 05/21/19 now with biochemical and scan progression with new lesions in T7, T12, and increase in pelvic and RP LAD. He is here for transfer of care from Dr Brandon Olsen and discussion of treatment options.    In 11/2019, we discussed that he has had clear progression of disease by both PSA and imaging (bone and pelvic/RP LAD) suggesting we need to transition him from abiraterone/pred/ADT. He is thankfully asymptomatic from his disease despite a PSA of 384 and clear bone metastases. We emphasized again that he has stage IV disease which is incurable but treatable. Based on current progression, it is more likely that he will die from  Prostate cancer than esophageal cancer (last EGD showed no evidence of esophageal cancer). Next line of care is docetaxel. Unfortunately radium is contraindicated due to pelvic and RP LAD (can be used in bone-only metastatic disease). We discussed docetaxel 75mg /m2 every three weeks for ten cycles. He has no neuropathy from Taxol and tolerated carbo/Taxol quite well. We discussed common side effects of docetaxel including N/V, alopecia, diarrhea, swelling, hypersensitivity reactions, and myelosuppression. Finally we discussed that Brandon Olsen goals of care are prolongation of survival and maintaining QOL.     On darolutamide, PSA is up for the first time.  Darolutamide is unlikely to work long-term, since it is the third AR signaling inhibitor (after enzalutamide, abiraterone). Docetaxel would be the next option.    Patient continues on darolutamide, PSA has again increased (now 925.26). Denosumab held 4 months ago due to concern for ONJ. Personally discussed case with Brandon Olsen, who will pursue biopsy of the left temporal bone. DDx at this time include: ONJ, radiation necrosis, metastatic disease, and abscess.    1. Castration Resistant Prostate Cancer with Bone and Pelvic/RP LN Metastases.  - Continue with Darolutamide for now.  - Continue with ADT, Eligard 45 mg last given on 6/22, next due 12/22 or later  - Will discontinue denosumab, given last on 01/21/2020. Continue calcium/Vitamin D.   - No somatic test has been done as tumor was from 2007  - Germline testing: negative 02/16/18  - Repeat Bone Scan prior to next visit.    2. Esophageal Adenocarcinoma, Stage III T3N0: s/p definitive chemoradiation (carbo/Taxol) per CROSS trial. Most recent EGD 07/15/19 with NED. Per [facility], needs EGD every 3-6 months for two years then every 6 months for the third year then as clinically indicated.     3. Return in 3 months, for labs, clinic visit  I personally spent 45 minutes face-to-face and non-face-to-face in the care of this patient, which includes all pre, intra, and post visit time on the date of service.      Reason for Visit  Follow up of metastatic castration resistant prostate cancer    History of Present Illness:  Oncology History Overview Note   - 2007, diagnosed with prostate cancer, Gleason 4+3=7, PSA 5.7. Treated with cryoablation.  - 02/2010, PET scan showed bone mets in femur and sacrum. Started on hormonal therapy with Lupron.  - 10/2016, PSA nadir of 0.38.  - 01/2017, second consecutive PSA rise to 2.21, CRPC progression documented.  - 4 /2019, PSA 44.2. CT and MRI showed borderline retroperitoneal and pelvic adenopathy and bone mets. Started enzalutamide + ADT.  - 9/19, PSA nadir 33.4  - 9/20, PSA 112, switched to abiraterone/pred/ADT  - 12/20, PSA nadir 46.3  - 2/21, PSA 384, BS with L frontal bone, multiple new vertebral mets and L 11th rib, CT with increase in pelvic and RP LAD  - 3/21, stopped abiraterone/prednisone, with recommendation to start docetaxel. Pt re-started enzalutamide on his own  - 5/21, enzalutamide stopped and darolutamide started     Malignant neoplasm of prostate (CMS-HCC)   01/13/2006 Biopsy    Gleason's 7 ( 4  3) right face, right mid medial, right mid lateral in left mid lateral high-grade PIN.  PSA 5.7 with 6% free PSA      01/24/2006 -  Cancer Staged    Bone scan:  No evidence of metastatic disease     02/02/2006 -  Cancer Staged    CT AP:  Horseshoe kidney present     03/22/2006 Surgery    Cryoablation     12/02/2009 -  Cancer Staged    MRI Pelvis:  No definite abnormality noted within the prostate.   2. Possible rectal lesion along the anterior wall of the rectum. Direct   visualization of the rectum is suggested for further evaluation.      02/25/2010 -  Cancer Staged    PET scan:  Findings concerning for bony metastatic involvement in the anterior right   proximal femur and in the left side of the sacrum as described. Other   areas   are in regions of degenerative changes especially in the lumbar and   cervical   spine areas.     10/15/2014 - 12/26/2017 Chemotherapy    Bicalutamide     09/01/2017 -  Cancer Staged    CT AP:  Bilateral prominent subcentimeter pelvic sidewall lymph nodes which are indeterminate but early metastatic disease cannot be excluded. Attention on follow-up.    Multiple hepatic hypodensities, these have CT appearance of simple cysts and biliary hamartomas. This could be further evaluated with MRI.    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    A 2.6 cm hypodense lesion is detected in the subcutaneous tissues of the right gluteal region. This lesion centrally demonstrates low attenuation close to water. Could be the sequela of fat necrosis or a small fluid collection secondary to injection. The lesion demonstrates no obvious rim enhancement or associated stranding. Correlation with clinical findings is recommended to exclude an abscess.  ??     09/01/2017 -  Cancer Staged    Bone scan: No evidence of osseous metastatic disease         12/12/2017 -  Cancer Staged    MRI AP:  --Irregular prostate mass with extracapsular extension consistent with primary malignancy.  --  Metastatic bilateral iliac and lower para-aortic lymphadenopathy.  --Vertebral and pelvic bone metastases.  --Peripherally enhancing fluid collection in the lateral aspect of the right gluteus maximus is favored to be posttraumatic in nature. Abscess could have this appearance in appropriate clinical setting.     12/12/2017 -  Cancer Staged    LD Screening Chest CT:  New lytic lesions in the thoracic spine, compatible with metastases, as suggested on same-day MRI of the abdomen and pelvis.    -No pulmonary nodules identified.     12/25/2017 -  Chemotherapy    Xtandi     02/16/2018 -  Cancer Staged    Prostate panel: No mutations identified. Tested genes: ATM, BRCA1, BRCA2, CHEK2, EPCAM*, MLH1, MSH2, MSH6, NBN, PMS2, TP53, HOXB13 (sequence change only)       02/27/2018 Endocrine/Hormone Therapy    OP LEUPROLIDE (22.5 MG EVERY 3 MONTHS)  Plan Provider: Towanda Malkin, MD     06/26/2019 - 07/31/2019 Endocrine/Hormone Therapy    OP LEUPROLIDE (7.5 MG EVERY MONTH)  Plan Provider: Towanda Malkin, MD     08/28/2019 - 11/26/2019 Endocrine/Hormone Therapy    OP LEUPROLIDE (ELIGARD) 22.5 MG EVERY 3 MONTHS  Plan Provider: Towanda Malkin, MD     11/06/2019 -  Cancer Staged    CT CAP:    No pulmonary metastasis.      Unchanged thoracic vertebral body lytic lesions compared to 01/28/2019.  Interval increase in pelvic and retroperitoneal lymphadenopathy, concerning for progressive metastatic disease, likely originating from prostate.     -Ill-defined posterior soft tissue of the prostate and osseous lesions of the axial and appendicular skeleton are better evaluated on prior MRI of the abdomen and pelvis. L1 vertebral body lytic lesion look unchanged today compared to 10/11/2018 PET/CT.     -Additional chronic and incidental findings as above.     11/06/2019 -  Cancer Staged    Bone scan:  New focal increase in radiotracer uptake in multiple osseous structures such as the left frontal bone, multiple thoracic/lumbar vertebrae and left 11th rib. These are suspicious for potential sites of metastatic disease however degenerative/posttraumatic changes cannot be excluded. R     11/26/2019 - 11/26/2019 Endocrine/Hormone Therapy    OP LEUPROLIDE (LUPRON) 22.5 MG EVERY 3 MONTHS  Plan Provider: Towanda Malkin, MD     03/03/2020 Endocrine/Hormone Therapy    OP LEUPROLIDE (ELIGARD) 45 MG EVERY 6 MONTHS  Plan Provider: Maurie Boettcher, MD     Adenocarcinoma of esophagus (CMS-HCC)   08/31/2018 Initial Diagnosis    Adenocarcinoma of esophagus (CMS-HCC)     08/31/2018 Biopsy    EGD/Colonoscopy:  Distal esophagus: Fragment of adenocarcinoma, and fragments of gastric oxyntic mucosa with healing erosive gastritis.  Stomach antrum: Healing erosive gastritis, negative for H. pylori, dysplasia or malignancy. 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       10/11/2018 -  Cancer Staged    PET CT:  - Circumferential thickening of the distal esophagus with increased FDG uptake, likely correlating to site of known malignancy.  - Focal uptake in the prostate to the right of midline likely represents prostate malignancy.  - Left para-aortic lymph nodes with mild FDG uptake, similar in size to prior MRI. These may be related to prostate cancer given low avidity.  - Redemonstrated multifocal osseous metastases; only the L1 vertebral body metastasis is metabolically active. Note that given low avidity, at least some of these may be related to prostate cancer rather than esophageal cancer.  -  FDG uptake in the right posterior gluteal musculature, likely inflammatory; associated fluid collection on prior MRI has mostly resolved.     10/24/2018 -  Cancer Staged    EUS:  Malignant esophageal tumor was found in the lower third                      of the esophagus.                     - A mass was found in the lower third of the esophagus.                      This was staged T3 (early) N0 Mx by endosonographic                      criteria.                     - One benign lymph node was visualized in the middle                      paraesophageal mediastinum (level 39M).                     - No specimens collected.     11/26/2018 -  Chemotherapy    OP GI CARBOPLATIN/PACLITAXEL (XRT)  PACLItaxel 50 mg/m2 and CARBOplatin AUC 2: held last week of radiation due to low Segs/Plt       01/28/2019 -  Cancer Staged    CT Chest:    Diffuse distal esophageal wall thickening without discrete mass.  ??  No evidence of pulmonary metastases.  ??  Multilevel lytic vertebral body lesions, better characterized on previous PET/CT and abdominal MRI             02/26/2019 Biopsy    Esophagus, distal, biopsy  - Scant fragments of poorly differentiated invasive adenocarcinoma in a background of squamocolumnar mucosa with extensive ulceration, acute and chronic inflammation, and edema  - No intestinal metaplasia identified     04/16/2019 Biopsy    Gastroesophageal junction, biopsy:   - Cardio-oxyntic type and squamocolumnar junction mucosa with erosion, glandular atrophy and reactive epithelial changes  - Fragments of inflammatory granulation tissue consistent with ulcer  - No intestinal metaplasia, dysplasia or carcinoma identified       Previous therapy  Androgen deprivation with Leuprolide  Enzalutamide  Abiraterone    Current therapy  Darolutamide    Interval history  The patient is being seen for scheduled follow here in the clinic, accompanied by his daughter. Patient is conversant and states that he continues to experience left jaw discomfort that is nagging and responds well to ibuprofen 800 mg taken once or twice daily. He has not had any difficulty chewing and thus has not made any modifications to his diet. He has occasional night flashes that are mild in quality. He otherwise denies fever, chills, night sweats, chest pain, palpitations, abdominal pain, diarrhea and constipation. He is scheduled to see Dr. Metta Clines on in two days to discuss biopsy. No other issues were noted.       Allergies:  No Known Allergies    Current Medications:    Current Outpatient Medications:   ???  albuterol (VENTOLIN HFA) 90 mcg/actuation inhaler, Inhale 2 puffs Four (4) times a day. , Disp: , Rfl:   ???  ascorbic acid (VITAMIN C) 1000 MG tablet,  Take 1,000 mg by mouth daily. , Disp: , Rfl:   ???  aspirin (ECOTRIN) 81 MG tablet, Take 81 mg by mouth. Frequency:QD   Dosage:81   MG  Instructions:  Note:Dose: 81MG , Disp: , Rfl:   ???  budesonide-formoteroL (SYMBICORT) 80-4.5 mcg/actuation inhaler, INHALE 1 PUFF BY MOUTH TWICE DAILY, Disp: , Rfl:   ???  cholecalciferol, vitamin D3 25 mcg, 1,000 units,, 1,000 unit (25 mcg) tablet, Take by mouth., Disp: , Rfl:   ???  cod liver oil Oil, Take by mouth. , Disp: , Rfl:   ???  darolutamide (NUBEQA) 300 mg tablet, Take 2 tablets (600 mg total) by mouth 2 (two) times a day with meals. Take with food. Swallow tablets whole., Disp: 120 tablet, Rfl: 11  ???  famotidine (PEPCID) 40 MG tablet, Take 1 tablet (40 mg total) by mouth nightly., Disp: 30 tablet, Rfl: 11  ???  furosemide (LASIX) 40 MG tablet, Take 1 tablet (40 mg total) by mouth daily. As needed for swelling., Disp: 30 tablet, Rfl: 11  ???  garlic 1 mg cap, Take by mouth daily. , Disp: , Rfl:   ???  garlic 1,000 mg cap, Take by mouth., Disp: , Rfl:   ???  ginger, zingiber officinalis, (GINGER EXTRACT) 250 mg cap, Take by mouth., Disp: , Rfl:   ???  glucosamine sulfate (GLUCOSAMINE) 500 mg Tab, Take 2 tablets by mouth once daily. , Disp: , Rfl:   ???  HYDROcodone-acetaminophen (NORCO) 5-325 mg per tablet, Take 1 tablet by mouth every six (6) hours as needed., Disp: , Rfl:   ???  ibuprofen (MOTRIN) 800 MG tablet, TAKE 1 TABLET BY MOUTH EVERY 8 HOURS AS NEEDED FOR PAIN, Disp: 90 tablet, Rfl: 0  ???  LYCOPENE ORAL, Take by mouth daily. , Disp: , Rfl:   ???  multivitamin capsule, Take 1 capsule by mouth., Disp: , Rfl:   ???  multivitamin-Ca-iron-minerals Tab, Take 1 tablet by mouth., Disp: , Rfl:   ???  mupirocin (BACTROBAN) 2 % ointment, APPLY TOPICALLY THREE TIMES DAILY FOR 7 DAYS, Disp: , Rfl:   ???  NON FORMULARY, 1 capsule daily. [place] , Disp: , Rfl:   ???  NON FORMULARY, Take by mouth every hour as needed. Cannadidiol, CBD, extract oral, Disp: , Rfl:   ???  nystatin (MYCOSTATIN) 100,000 unit/mL suspension, SWISH AND SWALLOW BY MOUTH 4 TIMES DAILY FOR 10 DAYS, Disp: , Rfl:   ???  omeprazole (PRILOSEC) 40 MG capsule, TAKE 1 CAPSULE BY MOUTH ONCE DAILY 30 MINUTES BEFORE BREAKFAST, Disp: , Rfl:   ???  pumpkin seed extract-soy germ 300 mg cap, Take by mouth., Disp: , Rfl:   ???  tamsulosin (FLOMAX) 0.4 mg capsule, Take 1 capsule (0.4 mg total) by mouth daily., Disp: 90 capsule, Rfl: 3  ???  traMADoL (ULTRAM) 50 mg tablet, Take 50 mg by mouth every six (6) hours as needed., Disp: , Rfl:   ???  UNABLE TO FIND, Med Name: Taking moringa 1,000 mg daily, Baobab po daily and Sour Sop leaves tea daily to fight cancer, Disp: , Rfl:   ???  UNABLE TO FIND, Med Name: black seed oil, Disp: , Rfl:   ???  UNABLE TO FIND, Take 1 tablet by mouth daily with evening meal. Med Name: Prostate Complete, Disp: , Rfl:   ???  UNABLE TO FIND, Beet root capsule, Disp: , Rfl:   ???  UNABLE TO FIND, Take 1 capsule by mouth daily. Med Name: pumpkin seed oil, Disp: ,  Rfl:   ???  vitamin E 400 UNIT capsule, Take 400 Units by mouth daily., Disp: , Rfl:   No current facility-administered medications for this visit.    Facility-Administered Medications Ordered in Other Visits:   ???  heparin, porcine (PF) 100 unit/mL injection 500 Units, 500 Units, Intravenous, Q30 Min PRN, Towanda Malkin, MD, 500 Units at 06/09/20 [phone]    Past Medical History and Social History  Past Medical History:   Diagnosis Date   ??? Congenital vascular abnormality    ??? COPD (chronic obstructive pulmonary disease) (CMS-HCC)    ??? Impotence of organic origin    ??? Inguinal hernia without mention of obstruction or gangrene, unilateral or unspecified, (not specified as recurrent)    ??? Malignant neoplasm of prostate (CMS-HCC)     01/16/2006 Gleason's 7 ( 4  3) right face, right mid medial, right mid lateral in left mid lateral high-grade PIN.  PSA 5.7 with 6% free PSA    ??? 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 Unspecified congenital anomaly of urinary system    ??? Unspecified hemorrhoids without mention of complication       Past Surgical History:   Procedure Laterality Date   ??? HEMORRHOID SURGERY     ??? IR INSERT PORT AGE GREATER THAN 5 YRS  11/13/2018    IR INSERT PORT AGE GREATER THAN 5 YRS 11/13/2018 Maree Erie, MD IMG [facility]   ??? PR ENDOSCOPIC US EXAM, ESOPH N/A 10/31/2018    Procedure: UGI ENDOSCOPY; WITH ENDOSCOPIC ULTRASOUND EXAMINATION LIMITED TO THE ESOPHAGUS;  Surgeon: Chriss Driver, MD;  Location: [facility];  Service: Gastroenterology   ??? PR UPPER GI ENDOSCOPY,BIOPSY N/A 02/26/2019    Procedure: UGI ENDOSCOPY; WITH BIOPSY, SINGLE OR MULTIPLE;  Surgeon: Chriss Driver, MD;  Location: [facility];  Service: Gastroenterology   ??? PR UPPER GI ENDOSCOPY,BIOPSY N/A 04/16/2019    Procedure: UGI ENDOSCOPY; WITH BIOPSY, SINGLE OR MULTIPLE;  Surgeon: Chriss Driver, MD;  Location: [facility];  Service: Gastroenterology   ??? PR UPPER GI ENDOSCOPY,BIOPSY N/A 07/15/2019    Procedure: UGI ENDOSCOPY; WITH BIOPSY, SINGLE OR MULTIPLE;  Surgeon: Maris Berger, MD;  Location: [facility];  Service: Gastroenterology   ??? PR UPPER GI ENDOSCOPY,DIAGNOSIS N/A 01/21/2020    Procedure: UGI ENDO, INCLUDE ESOPHAGUS, STOMACH, & DUODENUM &/OR JEJUNUM; DX W/WO COLLECTION SPECIMN, BY BRUSH OR WASH;  Surgeon: Neysa Hotter, MD;  Location: [facility];  Service: Gastroenterology   ??? PROSTATE CRYOABLATION      03/23/2006 left nerve sparing   ??? Seminal Vessicle Biopsy      12/22/2009        Social History     Occupational History   ??? Not on file   Tobacco Use   ??? Smoking status: Current Some Day Smoker     Packs/day: 1.00     Years: 50.00     Pack years: 50.00     Types: Cigars   ??? Smokeless tobacco: Never Used   ??? Tobacco comment: 1 cigar a day to every two day   Vaping Use   ??? Vaping Use: Never used   Substance and Sexual Activity   ??? Alcohol use: Yes     Alcohol/week: 19.0 standard drinks     Types: 12 Cans of beer, 7 Shots of liquor per week   ???  Drug use: Not Currently     Types: Marijuana     Comment: Rarely   ??? Sexual activity: Yes     Partners: Female   Pt is married. He is accompanied by his son.     Family History  Family History   Problem Relation Age of Onset   ??? Prostate cancer Brother    ??? Heart disease Mother    ??? Diabetes Mother    ??? Diabetes Sister    ??? Hypertension Sister    ??? Heart disease Brother    ??? Sarcoidosis Son    ??? Cancer Paternal Uncle    ??? Kidney cancer Neg Hx    ??? GU problems Neg Hx    ??? Urolithiasis Neg Hx    H/o prostate cancer in brother, who is alive with cancer at age 22 currently.  Pt has a son and a daughter, aged 77 and 43.      Review of Systems:  A comprehensive review of 10 systems was negative except for pertinent positives noted in HPI.    Physical Examination:    VITAL SIGNS:  BP 142/68  - Pulse 64  - Temp 36.7 ??C (98.1 ??F) (Oral)  - Resp 18  - Ht 175.3 cm (5' 9)  - Wt (!) 100.8 kg (222 lb 4.8 oz)  - SpO2 99%  - BMI 32.83 kg/m??   ECOG Performance Status:  GENERAL: Well-developed, well-nourished patient in no acute distress.  HEAD: Normocephalic and atraumatic.  EYES: Conjunctivae are normal. No scleral icterus.  MOUTH/THROAT: Oropharynx is clear and moist. Poor dentition.   NECK: Supple, no thyromegaly.  LYMPHATICS: No palpable cervical, supraclavicular, or axillary adenopathy.  CARDIOVASCULAR: Normal rate, regular rhythm and normal heart sounds.  Exam reveals no gallop and no friction rub.  No murmur heard.  PULMONARY/CHEST: Effort normal and breath sounds normal. No respiratory distress.  GASTROINTESTINAL/ABDOMINAL:  Soft. There is no distension. There is no tenderness. There is no rebound and no guarding.  MUSCULOSKELETAL: No clubbing, cyanosis, or lower extremity edema.  PSYCHIATRIC: Alert and oriented.  Normal mood and affect.  NEUROLOGIC: No focal motor deficit. Normal gait.  SKIN: Skin is warm, dry, and intact.      Results/Orders:    Lab on 06/09/2020   Component Date Value Ref Range Status   ??? PSA 06/09/2020 925.26* 0.00 - 4.00 ng/mL Final   ??? Phosphorus 06/09/2020 3.2  2.4 - 5.1 mg/dL Final   ??? Creatinine 06/09/2020 0.74  0.70 - 1.30 mg/dL Final   ??? EGFR CKD-EPI Non-African American,* 06/09/2020 90  >=60 mL/min/1.98m2 Final   ??? EGFR CKD-EPI African American, Male 06/09/2020 >90  >=60 mL/min/1.43m2 Final   ??? Calcium 06/09/2020 9.5  8.7 - 10.4 mg/dL Final   ??? Albumin 29/56/2130 3.4  3.4 - 5.0 g/dL Final   ??? WBC 86/57/8469 5.7  4.5 - 11.0 10*9/L Final   ??? RBC 06/09/2020 3.59* 4.50 - 5.90 10*12/L Final   ??? HGB 06/09/2020 10.9* 13.5 - 17.5 g/dL Final   ??? HCT 62/95/2841 33.2* 41.0 - 53.0 % Final   ??? MCV 06/09/2020 92.4  80.0 - 100.0 fL Final   ??? MCH 06/09/2020 30.2  26.0 - 34.0 pg Final   ??? MCHC 06/09/2020 32.7  31.0 - 37.0 g/dL Final   ??? RDW 32/44/0102 15.4* 12.0 - 15.0 % Final   ??? MPV 06/09/2020 7.4  7.0 - 10.0 fL Final   ??? Platelet 06/09/2020 419  150 - 440 10*9/L Final   ???  Neutrophils % 06/09/2020 69.6  % Final   ??? Lymphocytes % 06/09/2020 16.5  % Final   ??? Monocytes % 06/09/2020 5.1  % Final   ??? Eosinophils % 06/09/2020 5.5  % Final   ??? Basophils % 06/09/2020 0.4  % Final   ??? Absolute Neutrophils 06/09/2020 4.0  2.0 - 7.5 10*9/L Final   ??? Absolute Lymphocytes 06/09/2020 0.9* 1.5 - 5.0 10*9/L Final   ??? Absolute Monocytes 06/09/2020 0.3  0.2 - 0.8 10*9/L Final   ??? Absolute Eosinophils 06/09/2020 0.3  0.0 - 0.4 10*9/L Final   ??? Absolute Basophils 06/09/2020 0.0  0.0 - 0.1 10*9/L Final   ??? Large Unstained Cells 06/09/2020 3  0 - 4 % Final       Lab Results   Component Value Date    PSA 925.26 (H) 06/09/2020    PSA 456.00 (H) 03/03/2020    PSA 393.00 (H) 01/21/2020    PSA 411.00 (H) 12/31/2019    PSA 421.00 (H) 12/10/2019    PSA 384.00 (H) 11/26/2019    PSA 280.00 (H) 10/29/2019    PSA 46.30 (H) 08/28/2019    PSA 153.00 (H) 07/31/2019    PSA 155.00 (H) 06/26/2019     Pre-treatment baseline PSA for darolutamide is 411 on 4/20.        Orders placed or performed in visit on 06/09/20   ??? NM Bone Scan Whole Body   ??? LAB Appointment Request   ??? Clinic Appointment Request Lab, Physician       Imaging results:  NM Bone Scan 11/03/19:  New focal increase in radiotracer uptake in multiple osseous structures with include but not limited to: Left parietal bone, T7, T9, L1 and posterior left 11th rib.  ??  Degenerative changes noted in the bilateral shoulders, right hip, bilateral knees and right foot.  ??  Focal increase in radiotracer uptake in the mandible left of midline which may represent dental disease.  ??  Normal physiologic radiotracer uptake noted in the region of the diffuse kidney and urinary bladder.    CT CAP 11/02/19:  HEPATOBILIARY: Redemonstration of numerous hypoattenuating hepatic lesions measuring up to 2.8 cm (1:29), grossly unchanged from prior favor to represent hepatic cysts versus biliary hamartomas. Additional subcentimeter hypoattenuating lesions are too small to characterize accurately on CT but grossly unchanged from prior. The gallbladder is present and otherwise unremarkable. No biliary dilatation.    SPLEEN: Unremarkable.  PANCREAS: Unremarkable.  ??  ADRENALS: Mild thickening of the left adrenal gland, unchanged from prior. Unremarkable right adrenal gland.  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 No hydronephrosis.  ??  BLADDER: Unremarkable.  PELVIC/REPRODUCTIVE ORGANS: Heterogeneously enhancing prostate with ill-defined soft tissue posteriorly.  ??  GI TRACT: No dilated or thick walled loops of bowel. Duodenal diverticulosis. Colonic diverticulosis without evidence of diverticulitis. Normal appendix.  ??  PERITONEUM/RETROPERITONEUM AND MESENTERY: No free air or fluid.  LYMPH NODES: Enlarged retroperitoneal lymph nodes (for example, 1:54) including a lymph node conglomerate measuring up to 4.7 x 2.8 cm (1:66). An additional 1.6 cm enlarged left internal iliac lymph node (1.5).  ??  VESSELS: The aorta is normal in caliber.  No significant calcified atherosclerotic disease. The portal venous system is patent. The hepatic veins and IVC are unremarkable.  ??  BONES AND SOFT TISSUES: Grossly unchanged lytic lesion of the L1 vertebral body (1:46). Other previously noted osseous metastases seen on prior MRI are not well evaluated here. No new suspicious  lytic or blastic osseous lesions. Multilevel degenerative changes of the spine. Bilateral small fat-containing inguinal hernias.

## 2020-06-10 NOTE — Unmapped (Signed)
Clinical Pharmacist Practitioner: [facility]    I spent 20 minutes on the phone with the patient on the date of service. I spent an additional 10 minutes on pre- and post-visit activities on the date of service.   ??  The patient was physically located in [place] or a state in which I am permitted to provide care. The patient and/or parent/guardian understood that s/he may incur co-pays and cost sharing, and agreed to the telemedicine visit. The visit was reasonable and appropriate under the circumstances given the patient's presentation at the time.  ??  The patient and/or parent/guardian has been advised of the potential risks and limitations of this mode of treatment (including, but not limited to, the absence of in-person examination) and has agreed to be treated using telemedicine. The patient's/patient's family's questions regarding telemedicine have been answered.   ??  If the visit was completed in an ambulatory setting, the patient and/or parent/guardian has also been advised to contact their provider???s office for worsening conditions, and seek emergency medical treatment and/or call 911 if the patient deems either necessary.  ??  Patient Name: Brandon Olsen  Patient Age: 74 y.o.  ??  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 Dx with Gleason 4+3=7 prostate cancer treated with cryoablation then documented bone mets in 2011 treated with ADT, progressed to CRPC 01/2017 asymptomatic. Subsequently treated with enzalutamide + ADT with progression and most recently abiraterone/pred/ADT 05/21/19 now with biochemical and scan progression with new lesions in T7, T12, and increase in pelvic and RP LAD. He has had clear progression of disease by both PSA and imaging (bone and pelvic/RP LAD), he is asymptomatic from his disease despite a PSA of 384 and clear bone metastases. Discussion was had about docetaxel, he has no neuropathy from previous Taxol and tolerated carbo/Taxol quite well during treatment for esophageal adenocarcinoma. Patient decided against chemotherapy so will continue AR directed therapy with darolutamide.    1. CRPC with bone and pelvic/RP LN metastases- currently on darolutamide with good tolerability. Baseline PSA 411 ng/mL, PSA continues to increase 925.26 ng/mL (up from 456 ng/mL on 03/03/20. Will continue darolutamide for now.  - Continue darolutamide 600 mg BID  - S/p Lupron 45 mg on 03/03/20, next dose due on or after 08/30/20    2. 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 Continues to have aching pain in the jaw area. Plan for left temporal bone biopsy with Dr. Leretha Dykes.   - Continued to recommend calcium citrate plus vitamin D as patient is on PPI  - Discontinued denosumab for possible ONJ  ??  3. Esophageal Adenocarcinoma, Stage III T3N0- s/p definitive chemoradiation (carbo/Taxol) per CROSS trial. Most recent EGD 07/15/19 with NED. Per [facility], needs EGD every 3-6 months for two years then every 6 months for the third year then as clinically indicated.    Follow- up: Dr. Philomena Course on 07/14/20; [facility]PP f/u on 07/14/20  ______________________________________________________________________    Reason for visit:  Darolutamide monitoring and side effect management  ??  Current Drug and Dose: Darolutamide 600 mg BID  ??  Date of Initiation: 01/10/2020  ??  Oral Agent Toxicities:  None  ??  Interval History:  Brandon Olsen feels ok overall and is tolerating darolutamide with no new symptoms. He continues to report having hot flashes 1-2x per day but is tolerable. His only complaint is jaw pain,  in which a biopsy of the left temporal bone is being planned; has not set up an appointment yet. States that he takes PRN pain medications, including on average ibuprofen 800 mg (2 tablets) and/or acetaminophen 325 mg (1-2 tablets) per day with relief. Prior to taking pain medications, his pain is 6-9/10 and goes down to 2/10 afterwards. Endorses a continuous ache throughout the day. Has only taken Norco for 3 days when it was prescribed to him but has stopped taking it; states that it didn't seem to help. Otherwise, no additional new symptoms reported.  ??  Adherence: Taking 2 tablets twice daily with food, no missed doses  ??  Drug Interactions: potential DDI with supplements (patient thinks benefits outweigh risks)    Oncology History Overview Note   - 2007, diagnosed with prostate cancer, Gleason 4+3=7, PSA 5.7. Treated with cryoablation.  - 02/2010, PET scan showed bone mets in femur and sacrum. Started on hormonal therapy with Lupron.  - 10/2016, PSA nadir of 0.38.  - 01/2017, second consecutive PSA rise to 2.21, CRPC progression documented.  - 4 /2019, PSA 44.2. CT and MRI showed borderline retroperitoneal and pelvic adenopathy and bone mets. Started enzalutamide + ADT.  - 9/19, PSA nadir 33.4  - 9/20, PSA 112, switched to abiraterone/pred/ADT  - 12/20, PSA nadir 46.3  - 2/21, PSA 384, BS with L frontal bone, multiple new vertebral mets and L 11th rib, CT with increase in pelvic and RP LAD  - 3/21, stopped abiraterone/prednisone, with recommendation to start docetaxel. Pt re-started enzalutamide on his own  - 5/21, enzalutamide stopped and darolutamide started     Malignant neoplasm of prostate (CMS-HCC)   01/13/2006 Biopsy    Gleason's 7 ( 4  3) right face, right mid medial, right mid lateral in left mid lateral high-grade PIN.  PSA 5.7 with 6% free PSA      01/24/2006 -  Cancer Staged    Bone scan:  No evidence of metastatic disease     02/02/2006 -  Cancer Staged    CT AP:  Horseshoe kidney present     03/22/2006 Surgery    Cryoablation     12/02/2009 -  Cancer Staged    MRI Pelvis:  No definite abnormality noted within the prostate.   2. Possible rectal lesion along the anterior wall of the rectum. Direct   visualization of the rectum is suggested for further evaluation. 02/25/2010 -  Cancer Staged    PET scan:  Findings concerning for bony metastatic involvement in the anterior right   proximal femur and in the left side of the sacrum as described. Other   areas   are in regions of degenerative changes especially in the lumbar and   cervical   spine areas.     10/15/2014 - 12/26/2017 Chemotherapy    Bicalutamide     09/01/2017 -  Cancer Staged    CT AP:  Bilateral prominent subcentimeter pelvic sidewall lymph nodes which are indeterminate but early metastatic disease cannot be excluded. Attention on follow-up.    Multiple hepatic hypodensities, these have CT appearance of simple cysts and biliary hamartomas. This could be further evaluated with MRI.    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    A 2.6 cm hypodense lesion is detected in the subcutaneous tissues of the right gluteal region. This lesion centrally demonstrates low attenuation close to water. Could be the sequela of fat necrosis or a small fluid collection secondary to injection. The lesion  demonstrates no obvious rim enhancement or associated stranding. Correlation with clinical findings is recommended to exclude an abscess.  ??     09/01/2017 -  Cancer Staged    Bone scan: No evidence of osseous metastatic disease         12/12/2017 -  Cancer Staged    MRI AP:  --Irregular prostate mass with extracapsular extension consistent with primary malignancy.  --Metastatic bilateral iliac and lower para-aortic lymphadenopathy.  --Vertebral and pelvic bone metastases.  --Peripherally enhancing fluid collection in the lateral aspect of the right gluteus maximus is favored to be posttraumatic in nature. Abscess could have this appearance in appropriate clinical setting.     12/12/2017 -  Cancer Staged    LD Screening Chest CT:  New lytic lesions in the thoracic spine, compatible with metastases, as suggested on same-day MRI of the abdomen and pelvis.    -No pulmonary nodules identified.     12/25/2017 -  Chemotherapy Xtandi     02/16/2018 -  Cancer Staged    Prostate panel: No mutations identified. Tested genes: ATM, BRCA1, BRCA2, CHEK2, EPCAM*, MLH1, MSH2, MSH6, NBN, PMS2, TP53, HOXB13 (sequence change only)       02/27/2018 Endocrine/Hormone Therapy    OP LEUPROLIDE (22.5 MG EVERY 3 MONTHS)  Plan Provider: Towanda Malkin, MD     06/26/2019 - 07/31/2019 Endocrine/Hormone Therapy    OP LEUPROLIDE (7.5 MG EVERY MONTH)  Plan Provider: Towanda Malkin, MD     08/28/2019 - 11/26/2019 Endocrine/Hormone Therapy    OP LEUPROLIDE (ELIGARD) 22.5 MG EVERY 3 MONTHS  Plan Provider: Towanda Malkin, MD     11/06/2019 -  Cancer Staged    CT CAP:    No pulmonary metastasis.      Unchanged thoracic vertebral body lytic lesions compared to 01/28/2019.  Interval increase in pelvic and retroperitoneal lymphadenopathy, concerning for progressive metastatic disease, likely originating from prostate.     -Ill-defined posterior soft tissue of the prostate and osseous lesions of the axial and appendicular skeleton are better evaluated on prior MRI of the abdomen and pelvis. L1 vertebral body lytic lesion look unchanged today compared to 10/11/2018 PET/CT.     -Additional chronic and incidental findings as above.     11/06/2019 -  Cancer Staged    Bone scan:  New focal increase in radiotracer uptake in multiple osseous structures such as the left frontal bone, multiple thoracic/lumbar vertebrae and left 11th rib. These are suspicious for potential sites of metastatic disease however degenerative/posttraumatic changes cannot be excluded. R     11/26/2019 - 11/26/2019 Endocrine/Hormone Therapy    OP LEUPROLIDE (LUPRON) 22.5 MG EVERY 3 MONTHS  Plan Provider: Towanda Malkin, MD     03/03/2020 Endocrine/Hormone Therapy    OP LEUPROLIDE (ELIGARD) 45 MG EVERY 6 MONTHS  Plan Provider: Maurie Boettcher, MD     Adenocarcinoma of esophagus (CMS-HCC)   08/31/2018 Initial Diagnosis    Adenocarcinoma of esophagus (CMS-HCC)     08/31/2018 Biopsy    EGD/Colonoscopy:  Distal esophagus: Fragment of adenocarcinoma, and fragments of gastric oxyntic mucosa with healing erosive gastritis.  Stomach antrum: Healing erosive gastritis, negative for H. pylori, dysplasia or malignancy. 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       10/11/2018 -  Cancer Staged    PET CT:  - Circumferential thickening of the distal esophagus with increased FDG uptake, likely correlating to site of known malignancy.  - Focal uptake in the prostate to the right  of midline likely represents prostate malignancy.  - Left para-aortic lymph nodes with mild FDG uptake, similar in size to prior MRI. These may be related to prostate cancer given low avidity.  - Redemonstrated multifocal osseous metastases; only the L1 vertebral body metastasis is metabolically active. Note that given low avidity, at least some of these may be related to prostate cancer rather than esophageal cancer.  - FDG uptake in the right posterior gluteal musculature, likely inflammatory; associated fluid collection on prior MRI has mostly resolved.     10/24/2018 -  Cancer Staged    EUS:  Malignant esophageal tumor was found in the lower third                      of the esophagus.                     - A mass was found in the lower third of the esophagus.                      This was staged T3 (early) N0 Mx by endosonographic                      criteria.                     - One benign lymph node was visualized in the middle                      paraesophageal mediastinum (level 44M).                     - No specimens collected.     11/26/2018 -  Chemotherapy    OP GI CARBOPLATIN/PACLITAXEL (XRT)  PACLItaxel 50 mg/m2 and CARBOplatin AUC 2: held last week of radiation due to low Segs/Plt       01/28/2019 -  Cancer Staged    CT Chest:    Diffuse distal esophageal wall thickening without discrete mass.  ??  No evidence of pulmonary metastases.  ??  Multilevel lytic vertebral body lesions, better characterized on previous PET/CT and abdominal MRI             02/26/2019 Biopsy    Esophagus, distal, biopsy  - Scant fragments of poorly differentiated invasive adenocarcinoma in a background of squamocolumnar mucosa with extensive ulceration, acute and chronic inflammation, and edema  - No intestinal metaplasia identified     04/16/2019 Biopsy    Gastroesophageal junction, biopsy:   - Cardio-oxyntic type and squamocolumnar junction mucosa with erosion, glandular atrophy and reactive epithelial changes  - Fragments of inflammatory granulation tissue consistent with ulcer  - No intestinal metaplasia, dysplasia or carcinoma identified       Medications:  Current Outpatient Medications   Medication Sig Dispense Refill   ??? albuterol (VENTOLIN HFA) 90 mcg/actuation inhaler Inhale 2 puffs Four (4) times a day.      ??? ascorbic acid (VITAMIN C) 1000 MG tablet Take 1,000 mg by mouth daily.      ??? aspirin (ECOTRIN) 81 MG tablet Take 81 mg by mouth. Frequency:QD   Dosage:81   MG  Instructions:  Note:Dose: 81MG      ??? budesonide-formoteroL (SYMBICORT) 80-4.5 mcg/actuation inhaler INHALE 1 PUFF BY MOUTH TWICE DAILY     ??? cholecalciferol, vitamin D3 25 mcg, 1,000 units,, 1,000 unit (25 mcg) tablet Take by mouth.     ???  cod liver oil Oil Take by mouth.      ??? darolutamide (NUBEQA) 300 mg tablet Take 2 tablets (600 mg total) by mouth 2 (two) times a day with meals. Take with food. Swallow tablets whole. 120 tablet 11   ??? famotidine (PEPCID) 40 MG tablet Take 1 tablet (40 mg total) by mouth nightly. 30 tablet 11   ??? furosemide (LASIX) 40 MG tablet Take 1 tablet (40 mg total) by mouth daily. As needed for swelling. 30 tablet 11   ??? garlic 1 mg cap Take by mouth daily.      ??? garlic 1,000 mg cap Take by mouth.     ??? ginger, zingiber officinalis, (GINGER EXTRACT) 250 mg cap Take by mouth.     ??? glucosamine sulfate (GLUCOSAMINE) 500 mg Tab Take 2 tablets by mouth once daily.      ??? HYDROcodone-acetaminophen (NORCO) 5-325 mg per tablet Take 1 tablet by mouth every six (6) hours as needed.     ??? ibuprofen (MOTRIN) 800 MG tablet TAKE 1 TABLET BY MOUTH EVERY 8 HOURS AS NEEDED FOR PAIN 90 tablet 0   ??? LYCOPENE ORAL Take by mouth daily.      ??? multivitamin capsule Take 1 capsule by mouth.     ??? multivitamin-Ca-iron-minerals Tab Take 1 tablet by mouth.     ??? mupirocin (BACTROBAN) 2 % ointment APPLY TOPICALLY THREE TIMES DAILY FOR 7 DAYS     ??? NON FORMULARY 1 capsule daily. [place]      ??? NON FORMULARY Take by mouth every hour as needed. Cannadidiol, CBD, extract oral     ??? nystatin (MYCOSTATIN) 100,000 unit/mL suspension SWISH AND SWALLOW BY MOUTH 4 TIMES DAILY FOR 10 DAYS     ??? omeprazole (PRILOSEC) 40 MG capsule TAKE 1 CAPSULE BY MOUTH ONCE DAILY 30 MINUTES BEFORE BREAKFAST     ??? pumpkin seed extract-soy germ 300 mg cap Take by mouth.     ??? tamsulosin (FLOMAX) 0.4 mg capsule Take 1 capsule (0.4 mg total) by mouth daily. 90 capsule 3   ??? traMADoL (ULTRAM) 50 mg tablet Take 50 mg by mouth every six (6) hours as needed.     ??? UNABLE TO FIND Med Name: Taking moringa 1,000 mg daily, Baobab po daily and Sour Sop leaves tea daily to fight cancer     ??? UNABLE TO FIND Med Name: black seed oil     ??? UNABLE TO FIND Take 1 tablet by mouth daily with evening meal. Med Name: Prostate Complete     ??? UNABLE TO FIND Beet root capsule     ??? UNABLE TO FIND Take 1 capsule by mouth daily. Med Name: pumpkin seed oil     ??? vitamin E 400 UNIT capsule Take 400 Units by mouth daily.       No current facility-administered medications for this visit.     Facility-Administered Medications Ordered in Other Visits   Medication Dose Route Frequency Provider Last Rate Last Admin   ??? heparin, porcine (PF) 100 unit/mL injection 500 Units  500 Units Intravenous Q30 Min PRN Towanda Malkin, MD   500 Units at 06/09/20 0854       I spent 20 minutes with BrandonMorris in direct patient care.     Chilton Greathouse, PharmD  PGY-2 Hematology/Oncology Pharmacy Resident    I was the precepting pharmacist in the delivery of services. I agree with the plan as documented.    Laverna Peace PharmD, BCOP,  CPP  Hematology/Oncology Pharmacist  P: [phone]

## 2020-06-13 DIAGNOSIS — C61 Malignant neoplasm of prostate: Principal | ICD-10-CM

## 2020-06-23 ENCOUNTER — Ambulatory Visit: Admit: 2020-06-23 | Discharge: 2020-07-06 | Payer: MEDICARE

## 2020-06-23 ENCOUNTER — Ambulatory Visit: Admit: 2020-06-23 | Discharge: 2020-06-24 | Payer: MEDICARE

## 2020-06-23 ENCOUNTER — Ambulatory Visit
Admit: 2020-06-23 | Discharge: 2020-07-12 | Payer: MEDICARE | Attending: Radiation Oncology | Primary: Radiation Oncology

## 2020-06-23 MED ADMIN — Technetium Tc-99m Oxidronate HDP: 26.3 | INTRAVENOUS | @ 12:00:00 | Stop: 2020-06-23

## 2020-06-23 MED ADMIN — iohexoL (OMNIPAQUE) 350 mg iodine/mL solution 100 mL: 100 mL | INTRAVENOUS | @ 12:00:00 | Stop: 2020-06-23

## 2020-06-23 NOTE — Unmapped (Signed)
RADIATION ONCOLOGY FOLLOW-UP VISIT NOTE     Encounter Date: 06/23/2020  Patient Name: Brandon Olsen  Medical Record Number: [id number]    DIAGNOSIS:  74yo with cT3N0 adenocarcinoma of the GE junction s/p chemoRT (50.4Gy finished 01/02/2019). ??He also has a history of metastatic prostate cancer with bone metastases with likely progression, currently managed with lupron and enzalutamide    DURATION SINCE COMPLETION OF RADIOTHERAPY:  1 year, 6 months (01/02/2019)    ASSESSMENT:  Disease Status: Concern for recurrent disease on restaging scans from today with mediastinal/hilar masses.  He also has a separate metastatic cancer (prostate) although this would be a somewhat unusual pattern of spread.    RECOMMENDATIONS:  1. FOLLOW-UP:  I am worried that the mediastinal mass could represent a recurrence of his esophageal cancer, although I can't rule out metastatic prostate cancer or a separate malignancy.  He has had difficulties with prior procedures (prior EGD complicated by desats so procedure aborted) and he is hesitant to proceed with another invasive procedure.  If this were to represent an esophageal cancer recurrence it's not clear that he would want any systemic therapy as he has declined docetaxel for his prostate cancer due to concern for side effects.  I have reviewed the prior RT plan and the mass is just above the superior extent of the prior RT field so palliative RT could be considered if this becomes more symptomatic.  I'll reach out to Dr. Philomena Course to get a better sense of his prostate cancer prognosis.  I do worry that the esophageal cancer will become more of an issue in the near future and I'll tentatively plan to refer to GI med onc to explore systemic options (prior med onc was Dr. Claude Manges).  2. Jaw pain:  Has pending evaluation with surgery for biopsy- further treatment depending on cause.  If this were related to metastatic disease we could consider palliative RT  3. Swallowing:  Minimally symptomatic- has doubled in the past 3 months and NM Bone scan shows progression of disease.  Defer management to Dr. Philomena Course.    INTERVAL HISTORY:      Brandon Olsen is doing OK today- he's had a rough past few months.  He has had ongoing jaw pain of unclear etiology- he is seeing a surgeon soon for further evaluation and biopsy.  He does have some mild obstructive symptoms when swallowing solid/dry foods- feels like they get caught in the mid-chest but not very bothersome.  No issues with soft foods or liquids.  No chest pain, no respiratory symptoms.  No other painful areas in his body.          REVIEW OF SYSTEMS:  A comprehensive review of 10 systems was negative except for pertinent positives noted in HPI.    PAST MEDICAL HISTORY/FAMILY HISTORY/SOCIAL HISTORY:  Reviewed in EPIC    ALLERGIES/MEDICATIONS:  Reviewed in EPIC    PHYSICAL EXAM:  Vital Signs for this encounter:   BP 144/82  - Pulse 64  - Temp 36.6 ??C (97.9 ??F) (Temporal)  - Wt (!) 102.4 kg (225 lb 12.8 oz)  - SpO2 98%  - BMI 33.34 kg/m??   Karnofsky/Lansky Performance Status: 80, Normal activity with effort; some signs or symptoms of disease (ECOG equivalent 1)  General:   No acute distress, alert and oriented X 4   Head: Normocephalic, without obvious abnormality, atraumatic  Eyes: EOMI, no scleral icterus  Lungs: normal work of breathing  Heart: regular rate and rhythm, S1, S2 normal, no  murmur, click, rub or gallop  Abdomen: soft, non-tender, non-distedned  Extremities: extremities normal, atraumatic, no cyanosis or edema  Lymph nodes: No palpable supraclavicular or cervical LN  Neurologic: Grossly normal    RADIOLOGY:  None to review    Labs:    No results found for: WBC, HGB, HCT, PLT, LDH, CREATININE, AST, ALT, MG    Rayetta Humphrey, MD  Assistant Professor  [facility] Dept of Radiation Oncology  06/23/2020 worsening of diffuse abdominopelvic metastatic adenopathy, as above.  -increased size and heterogeneous enhancement of the prostate and left greater than right seminal vesicles, concerning for concurrent primary versus metastatic malignancy. Nodular soft tissue in the region of the the left seminal vesicle measures 3 .9 x 3.3 centimeters and abuts the anterior rectum.  -New subcentimeter left retroperitoneal soft tissue nodule adjacent to the left adrenal gland.     06/23/2020- NM Bone scan personally reviewed  Multiple new abnormal focal increases in radiotracer uptake in multiple osseous structures as described above. These are concerning for new sites of disease.    Labs:    No results found for: WBC, HGB, HCT, PLT, LDH, CREATININE, AST, ALT, MG    Rayetta Humphrey, MD  Assistant Professor  [facility] Dept of Radiation Oncology  06/23/2020

## 2020-06-23 NOTE — Unmapped (Unsigned)
Pt here to fu with Dr Carles Collet. States he has pain in his jaw that is constant . He is scheduled for surgery to take a bx.

## 2020-06-23 NOTE — Unmapped (Addendum)
Today in clinic we reviewed your imaging which included a CT scan of your chest and abdomen/pelvis.  These scans showed a new mass in your chest that is concerning for another spot of the esophageal cancer.  However, it could also represent another site of disease for your prostate cancer.  I will discuss these findings with the rest of your team.  I may set up an appointment with another medical oncology (chemo) doctor to discuss treatment options for the esophageal cancer.    You did have a recent PSA 2 weeks ago with Dr. Philomena Course that was 926 (increased from the prior level of 456 3 months ago).  This is concerning for progression of your prostate cancer as well. Dr. Philomena Course is going to discuss the best way to treat this which could involve chemotherapy.

## 2020-06-26 NOTE — Unmapped (Addendum)
**Addendum 10/19: Per Dr. Philomena Course, hold Nubeqa refills as treatment options will be reevaluated at his next appt on 11/2. Spoke with Brandon Olsen and he is aware that we will not be sending a refill for Nubeqa at this time.Brandon Olsen [facility] Clinical Assessment & Refill Coordination Note    Brandon Olsen, DOB: 1946-09-07  Phone: [phone] (home) [phone] (work)    All above HIPAA information was verified with patient.     Was a Nurse, learning disability used for this call? No    Specialty Medication(s):   Hematology/Oncology: Nubeqa     Current Outpatient Medications   Medication Sig Dispense Refill   ??? albuterol (VENTOLIN HFA) 90 mcg/actuation inhaler Inhale 2 puffs Four (4) times a day.      ??? ascorbic acid (VITAMIN C) 1000 MG tablet Take 1,000 mg by mouth daily.      ??? aspirin (ECOTRIN) 81 MG tablet Take 81 mg by mouth. Frequency:QD   Dosage:81   MG  Instructions:  Note:Dose: 81MG      ??? budesonide-formoteroL (SYMBICORT) 80-4.5 mcg/actuation inhaler INHALE 1 PUFF BY MOUTH TWICE DAILY     ??? cholecalciferol, vitamin D3 25 mcg, 1,000 units,, 1,000 unit (25 mcg) tablet Take by mouth.     ??? cod liver oil Oil Take by mouth.      ??? darolutamide (NUBEQA) 300 mg tablet Take 2 tablets (600 mg total) by mouth 2 (two) times a day with meals. Take with food. Swallow tablets whole. 120 tablet 11   ??? famotidine (PEPCID) 40 MG tablet Take 1 tablet (40 mg total) by mouth nightly. 30 tablet 11   ??? furosemide (LASIX) 40 MG tablet Take 1 tablet (40 mg total) by mouth daily. As needed for swelling. 30 tablet 11   ??? garlic 1 mg cap Take by mouth daily.  (Patient not taking: Reported on 06/23/2020)     ??? garlic 1,000 mg cap Take by mouth.     ??? ginger, zingiber officinalis, (GINGER EXTRACT) 250 mg cap Take by mouth.     ??? glucosamine sulfate (GLUCOSAMINE) 500 mg Tab Take 2 tablets by mouth once daily.      ??? HYDROcodone-acetaminophen (NORCO) 5-325 mg per tablet Take 1 tablet by mouth every six (6) hours as needed. (Patient not taking: Reported on 06/23/2020)     ??? ibuprofen (MOTRIN) 800 MG tablet TAKE 1 TABLET BY MOUTH EVERY 8 HOURS AS NEEDED FOR PAIN 90 tablet 0   ??? LYCOPENE ORAL Take by mouth daily.      ??? multivitamin capsule Take 1 capsule by mouth.     ??? multivitamin-Ca-iron-minerals Tab Take 1 tablet by mouth.     ??? mupirocin (BACTROBAN) 2 % ointment APPLY TOPICALLY THREE TIMES DAILY FOR 7 DAYS     ??? NON FORMULARY 1 capsule daily. [place]      ??? NON FORMULARY Take by mouth every hour as needed. Cannadidiol, CBD, extract oral     ??? nystatin (MYCOSTATIN) 100,000 unit/mL suspension SWISH AND SWALLOW BY MOUTH 4 TIMES DAILY FOR 10 DAYS (Patient not taking: Reported on 06/23/2020)     ??? omeprazole (PRILOSEC) 40 MG capsule TAKE 1 CAPSULE BY MOUTH ONCE DAILY 30 MINUTES BEFORE BREAKFAST     ??? pumpkin seed extract-soy germ 300 mg cap Take by mouth.     ??? tamsulosin (FLOMAX) 0.4 mg capsule Take 1 capsule (0.4 mg total) by mouth daily. 90 capsule 3   ??? traMADoL (ULTRAM) 50 mg  tablet Take 50 mg by mouth every six (6) hours as needed. (Patient not taking: Reported on 06/23/2020)     ??? UNABLE TO FIND Med Name: Taking moringa 1,000 mg daily, Baobab po daily and Sour Sop leaves tea daily to fight cancer     ??? UNABLE TO FIND Med Name: black seed oil     ??? UNABLE TO FIND Take 1 tablet by mouth daily with evening meal. Med Name: Prostate Complete     ??? UNABLE TO FIND Beet root capsule     ??? UNABLE TO FIND Take 1 capsule by mouth daily. Med Name: pumpkin seed oil (Patient not taking: Reported on 06/23/2020)     ??? vitamin E 400 UNIT capsule Take 400 Units by mouth daily.       No current facility-administered medications for this visit.        Changes to medications: Brandon Olsen reports no changes at this time.    No Known Allergies    Changes to allergies: No    SPECIALTY MEDICATION ADHERENCE     Nubeqa 300 mg: 8 days of medicine on hand     Specialty medication(s) dose(s) confirmed: Regimen is correct and unchanged.     Are there any concerns with adherence? No    Adherence counseling provided? Not needed    CLINICAL MANAGEMENT AND INTERVENTION      Clinical Benefit Assessment:    Do you feel the medicine is effective or helping your condition? No, PSA has doubled since starting Nubeqa and patient is concerned this medication is not improving his condition     Clinical Benefit counseling provided? consulted provider regarding clinical benefit concerns    Adverse Effects Assessment:    Are you experiencing any side effects? No    Are you experiencing difficulty administering your medicine? No    Quality of Life Assessment:    How many days over the past month did your prostate cancer  keep you from your normal activities? For example, brushing your teeth or getting up in the morning. Patient declined to answer    Have you discussed this with your provider? Not needed    Therapy Appropriateness:    Is therapy appropriate? Pharmacist will consult provider    DISEASE/MEDICATION-SPECIFIC INFORMATION      N/A    PATIENT SPECIFIC NEEDS     - Does the patient have any physical, cognitive, or cultural barriers? No    - Is the patient high risk? No    - Does the patient require a Care Management Plan? No     - Does the patient require physician intervention or other additional services (i.e. nutrition, smoking cessation, social work)? No      SHIPPING     Specialty Medication(s) to be Shipped:   Hematology/Oncology: Nubeqa    Other medication(s) to be shipped: No additional medications requested for fill at this time     Changes to insurance: No    Delivery Scheduled: Not scheduled at this time. Will consult provider to determine if therapy is still appropriate     Medication will be delivered via N/A to the confirmed N/A address in [facility].    The patient will receive a drug information handout for each medication shipped and additional FDA Medication Guides as required.  Verified that patient has previously received a Conservation officer, historic buildings.    All of the patient's questions and concerns have been addressed.    Vernon Prey   [facility]  Pharmacy Specialty Pharmacist    I reviewed this patient case and all documentation provided by the learner and was readily available for consultation during their interaction with the patient.  I agree with the assessment and plan listed below.    Breck Coons [facility] Specialty Pharmacist

## 2020-07-05 MED ORDER — IBUPROFEN 800 MG TABLET
ORAL_TABLET | 0 refills | 0 days
Start: 2020-07-05 — End: ?

## 2020-07-06 MED ORDER — IBUPROFEN 800 MG TABLET
ORAL_TABLET | 0 refills | 0 days
Start: 2020-07-06 — End: ?

## 2020-07-07 MED ORDER — AMOXICILLIN 500 MG CAPSULE
Freq: Two times a day (BID) | ORAL | 0.00000 days
Start: 2020-07-07 — End: 2020-10-20

## 2020-07-07 MED ORDER — CHLORHEXIDINE GLUCONATE 0.12 % MOUTHWASH
Freq: Three times a day (TID) | OROMUCOSAL | 0 days
Start: 2020-07-07 — End: 2020-10-20

## 2020-07-07 MED ORDER — DEXAMETHASONE 4 MG TABLET
Freq: Three times a day (TID) | ORAL | 0 days
Start: 2020-07-07 — End: 2020-10-20

## 2020-07-14 ENCOUNTER — Other Ambulatory Visit: Admit: 2020-07-14 | Discharge: 2020-07-14 | Payer: MEDICARE

## 2020-07-14 ENCOUNTER — Ambulatory Visit
Admit: 2020-07-14 | Discharge: 2020-07-14 | Payer: MEDICARE | Attending: Hematology & Oncology | Primary: Hematology & Oncology

## 2020-07-14 DIAGNOSIS — C61 Malignant neoplasm of prostate: Principal | ICD-10-CM

## 2020-07-14 DIAGNOSIS — C159 Malignant neoplasm of esophagus, unspecified: Principal | ICD-10-CM

## 2020-07-14 DIAGNOSIS — C7951 Secondary malignant neoplasm of bone: Principal | ICD-10-CM

## 2020-07-14 LAB — CBC W/ AUTO DIFF
BASOPHILS ABSOLUTE COUNT: 0 10*9/L (ref 0.0–0.1)
EOSINOPHILS ABSOLUTE COUNT: 0.3 10*9/L (ref 0.0–0.4)
EOSINOPHILS RELATIVE PERCENT: 5.3 %
HEMATOCRIT: 34.2 % — ABNORMAL LOW (ref 41.0–53.0)
HEMOGLOBIN: 11 g/dL — ABNORMAL LOW (ref 13.5–17.5)
LARGE UNSTAINED CELLS: 2 % (ref 0–4)
LYMPHOCYTES ABSOLUTE COUNT: 1 10*9/L — ABNORMAL LOW (ref 1.5–5.0)
MEAN CORPUSCULAR HEMOGLOBIN CONC: 32.2 g/dL (ref 31.0–37.0)
MEAN CORPUSCULAR HEMOGLOBIN: 29.4 pg (ref 26.0–34.0)
MEAN CORPUSCULAR VOLUME: 91.3 fL (ref 80.0–100.0)
MEAN PLATELET VOLUME: 7.7 fL (ref 7.0–10.0)
MONOCYTES ABSOLUTE COUNT: 0.3 10*9/L (ref 0.2–0.8)
MONOCYTES RELATIVE PERCENT: 5.6 %
NEUTROPHILS ABSOLUTE COUNT: 3.3 10*9/L (ref 2.0–7.5)
NEUTROPHILS RELATIVE PERCENT: 65.7 %
PLATELET COUNT: 375 10*9/L (ref 150–440)
RED BLOOD CELL COUNT: 3.75 10*12/L — ABNORMAL LOW (ref 4.50–5.90)
RED CELL DISTRIBUTION WIDTH: 15.2 % — ABNORMAL HIGH (ref 12.0–15.0)
WBC ADJUSTED: 5 10*9/L (ref 4.5–11.0)

## 2020-07-14 LAB — COMPREHENSIVE METABOLIC PANEL
ALKALINE PHOSPHATASE: 56 U/L (ref 46–116)
ALT (SGPT): 11 U/L (ref 10–49)
ANION GAP: 11 mmol/L (ref 5–14)
AST (SGOT): 23 U/L (ref <=34)
BILIRUBIN TOTAL: 0.8 mg/dL (ref 0.3–1.2)
BLOOD UREA NITROGEN: 17 mg/dL (ref 9–23)
BUN / CREAT RATIO: 22
CALCIUM: 9.5 mg/dL (ref 8.7–10.4)
CHLORIDE: 103 mmol/L (ref 98–107)
CO2: 24 mmol/L (ref 20.0–31.0)
CREATININE: 0.78 mg/dL
EGFR CKD-EPI AA MALE: >90 mL/min/{1.73_m2} (ref >=60)
EGFR CKD-EPI NON-AA MALE: 88 mL/min/{1.73_m2} (ref >=60)
GLUCOSE RANDOM: 97 mg/dL (ref 70–179)
POTASSIUM: 3.7 mmol/L (ref 3.5–5.1)
PROTEIN TOTAL: 6.6 g/dL (ref 5.7–8.2)
SODIUM: 138 mmol/L (ref 135–145)

## 2020-07-14 LAB — EGFR CKD-EPI AA MALE: Glomerular filtration rate/1.73 sq M.predicted.black:ArVRat:Pt:Ser/Plas/Bld:Qn:Creatinine-based formula (CKD-EPI): >90

## 2020-07-14 LAB — MONOCYTES RELATIVE PERCENT: Monocytes/100 leukocytes:NFr:Pt:Bld:Qn:Automated count: 5.6

## 2020-07-14 LAB — CREATININE: CREATININE: 0.74 mg/dL

## 2020-07-14 LAB — ALBUMIN: Albumin:MCnc:Pt:Ser/Plas:Qn:: 3.6

## 2020-07-14 LAB — CALCIUM: Calcium:MCnc:Pt:Ser/Plas:Qn:: 9.4

## 2020-07-14 LAB — CO2: Carbon dioxide:SCnc:Pt:Ser/Plas:Qn:: 24

## 2020-07-14 LAB — PHOSPHORUS: Phosphate:MCnc:Pt:Ser/Plas:Qn:: 3.1

## 2020-07-14 LAB — PROSTATE SPECIFIC ANTIGEN: Prostate specific Ag:MCnc:Pt:Ser/Plas:Qn:: 1054.84 — ABNORMAL HIGH

## 2020-07-14 MED ADMIN — heparin, porcine (PF) 100 unit/mL injection 500 Units: 500 [IU] | INTRAVENOUS | @ 11:00:00 | Stop: 2020-07-14

## 2020-07-14 NOTE — Unmapped (Addendum)
Lab Results   Component Value Date    PSA 925.26 (H) 06/09/2020    PSA 456.00 (H) 03/03/2020    PSA 393.00 (H) 01/21/2020    PSA 411.00 (H) 12/31/2019    PSA 421.00 (H) 12/10/2019    PSA 384.00 (H) 11/26/2019     Scans show prostate cancer worsening in the bones and lymph nodes nodes.    Stop darolutamide (Nubeqa).    We discussed starting chemo with docetaxel (Taxotere).        Please call [phone] to reach my nurse navigator Mauricia Area for any issues.    For emergencies on Nights, Weekends and Holidays  Call [phone] and ask for the hematology/oncology on call.    Griffin Basil, MD, PhD  Associate Professor of Medicine  Division of Hematology-Oncology    [facility]  Genitourinary Oncology Clinic  Nurse Navigator: Mauricia Area  Fax: [phone]

## 2020-07-14 NOTE — Unmapped (Addendum)
[facility] Visit Note    Patient Name: Brandon Olsen  Patient Age: 74 y.o.  Encounter Date: 07/14/2020  Attending Provider:  [place] Gipe E. Philomena Course, MD  Referring physician: Maurie Boettcher, MD    Assessment  Patient Active Problem List   Diagnosis   ??? Impotence of organic origin   ??? Malignant neoplasm of prostate (CMS-HCC)   ??? Congenital anomaly of aorta   ??? Secondary malignant neoplasm of bone (CMS-HCC)   ??? Inguinal hernia   ??? Congenital anomaly of urinary system   ??? Chronic airway obstruction (CMS-HCC)   ??? Hyperlipidemia   ??? Gynecomastia, male   ??? Bladder outlet obstruction   ??? Adenocarcinoma of esophagus (CMS-HCC)       A 74 y.o. 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 Dx with Gleason 4+3=7 prostate cancer treated with cryoablation then documented bone mets in 2011 treated with ADT, progressed to CRPC 01/2017 asymptomatic. Subsequently treated with enzalutamide + ADT with progression and most recently abiraterone/pred/ADT 05/21/19 now with biochemical and scan progression with new lesions in T7, T12, and increase in pelvic and RP LAD. He is here for transfer of care from Dr Brandon Olsen and discussion of treatment options.    In 11/2019, we discussed that he has had clear progression of disease by both PSA and imaging (bone and pelvic/RP LAD) suggesting we need to transition him from abiraterone/pred/ADT. He is thankfully asymptomatic from his disease despite a PSA of 384 and clear bone metastases. We emphasized again that he has stage IV disease which is incurable but treatable. Based on current progression, it is more likely that he will die from  Prostate cancer than esophageal cancer (last EGD showed no evidence of esophageal cancer). Next line of care is docetaxel. Unfortunately radium is contraindicated due to pelvic and RP LAD (can be used in bone-only metastatic disease). We discussed docetaxel 75mg /m2 every three weeks for ten cycles. He has no neuropathy from Taxol and tolerated carbo/Taxol quite well. We discussed common side effects of docetaxel including N/V, alopecia, diarrhea, swelling, hypersensitivity reactions, and myelosuppression. Finally we discussed that Brandon Olsen goals of care are prolongation of survival and maintaining QOL.     On darolutamide, PSA is up for the first time.  Darolutamide is unlikely to work long-term, since it is the third AR signaling inhibitor (after enzalutamide, abiraterone). Docetaxel would be the next option.    Patient continues on darolutamide, PSA has again increased (now 925.26). Denosumab held 4 months ago due to concern for ONJ. Personally discussed case with Dr. Yolanda Olsen, who will pursue biopsy of the left temporal bone. DDx at this time include: ONJ, radiation necrosis, metastatic disease, and abscess.    Today, on 07/14/2020, we discussed the fact that his most recent scans show progressive disease in the abdominal and mediastinal/hilar nodes as well as bone.  With PSA going from 400 to 1054 today, I think it's safe to assume that the LN and bone disease represent prostate cancer, rather than relapse of esophageal cancer.  Pt just had his mandible bone area biopsied and I'd like to find out the pathology and give time to heal.  I did discuss the fact that his prostate cancer is progressing and I would recommend docetaxel chemo now. We discussed complications of progressive met disease and the fact that this is a terminal disease and chemo may have modest impact on survival and symptoms.    1. Castration Resistant Prostate Cancer with Bone  and Pelvic/RP LN Metastases.  - Stop Darolutamide  - Continue with ADT, Eligard 45 mg last given on 6/22, next due 12/22 or later  - Will discontinue denosumab, given last on 01/21/2020. Continue calcium/Vitamin D.   - No somatic test has been done as tumor was from 2007  - Germline testing: negative 02/16/18  - Docetaxel recommended. Will wait a few weeks.  - Get oral surgery records/path    2. Esophageal Adenocarcinoma, Stage III T3N0: s/p definitive chemoradiation (carbo/Taxol) per CROSS trial. Most recent EGD 07/15/19 with NED. Per [facility], needs EGD every 3-6 months for two years then every 6 months for the third year then as clinically indicated.     3. Return in 4 weeks.    I personally spent 50 minutes face-to-face and non-face-to-face in the care of this patient, which includes all pre, intra, and post visit time on the date of service.      Reason for Visit  Follow up of metastatic castration resistant prostate cancer    History of Present Illness:  Oncology History Overview Note   - 2007, diagnosed with prostate cancer, Gleason 4+3=7, PSA 5.7. Treated with cryoablation.  - 02/2010, PET scan showed bone mets in femur and sacrum. Started on hormonal therapy with Lupron.  - 10/2016, PSA nadir of 0.38.  - 01/2017, second consecutive PSA rise to 2.21, CRPC progression documented.  - 4 /2019, PSA 44.2. CT and MRI showed borderline retroperitoneal and pelvic adenopathy and bone mets. Started enzalutamide + ADT.  - 9/19, PSA nadir 33.4  - 9/20, PSA 112, switched to abiraterone/pred/ADT  - 12/20, PSA nadir 46.3  - 2/21, PSA 384, BS with L frontal bone, multiple new vertebral mets and L 11th rib, CT with increase in pelvic and RP LAD  - 3/21, stopped abiraterone/prednisone, with recommendation to start docetaxel. Pt re-started enzalutamide on his own  - 5/21, enzalutamide stopped and darolutamide started     Malignant neoplasm of prostate (CMS-HCC)   01/13/2006 Biopsy    Gleason's 7 ( 4  3) right face, right mid medial, right mid lateral in left mid lateral high-grade PIN.  PSA 5.7 with 6% free PSA      01/24/2006 -  Cancer Staged    Bone scan:  No evidence of metastatic disease     02/02/2006 -  Cancer Staged    CT AP:  Horseshoe kidney present     03/22/2006 Surgery    Cryoablation     12/02/2009 -  Cancer Staged    MRI Pelvis:  No definite abnormality noted within the prostate.   2. Possible rectal lesion along the anterior wall of the rectum. Direct   visualization of the rectum is suggested for further evaluation.      02/25/2010 -  Cancer Staged    PET scan:  Findings concerning for bony metastatic involvement in the anterior right   proximal femur and in the left side of the sacrum as described. Other   areas   are in regions of degenerative changes especially in the lumbar and   cervical   spine areas.     10/15/2014 - 12/26/2017 Chemotherapy    Bicalutamide     09/01/2017 -  Cancer Staged    CT AP:  Bilateral prominent subcentimeter pelvic sidewall lymph nodes which are indeterminate but early metastatic disease cannot be excluded. Attention on follow-up.    Multiple hepatic hypodensities, these have CT appearance of simple cysts and biliary hamartomas. This could be further evaluated  with MRI.    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    A 2.6 cm hypodense lesion is detected in the subcutaneous tissues of the right gluteal region. This lesion centrally demonstrates low attenuation close to water. Could be the sequela of fat necrosis or a small fluid collection secondary to injection. The lesion demonstrates no obvious rim enhancement or associated stranding. Correlation with clinical findings is recommended to exclude an abscess.  ??     09/01/2017 -  Cancer Staged    Bone scan: No evidence of osseous metastatic disease         12/12/2017 -  Cancer Staged    MRI AP:  --Irregular prostate mass with extracapsular extension consistent with primary malignancy.  --Metastatic bilateral iliac and lower para-aortic lymphadenopathy.  --Vertebral and pelvic bone metastases.  --Peripherally enhancing fluid collection in the lateral aspect of the right gluteus maximus is favored to be posttraumatic in nature. Abscess could have this appearance in appropriate clinical setting.     12/12/2017 -  Cancer Staged    LD Screening Chest CT:  New lytic lesions in the thoracic spine, compatible with metastases, as suggested on same-day MRI of the abdomen and pelvis.    -No pulmonary nodules identified.     12/25/2017 -  Chemotherapy    Xtandi     02/16/2018 -  Cancer Staged    Prostate panel: No mutations identified. Tested genes: ATM, BRCA1, BRCA2, CHEK2, EPCAM*, MLH1, MSH2, MSH6, NBN, PMS2, TP53, HOXB13 (sequence change only)       02/27/2018 Endocrine/Hormone Therapy    OP LEUPROLIDE (22.5 MG EVERY 3 MONTHS)  Plan Provider: Towanda Malkin, MD     06/26/2019 - 07/31/2019 Endocrine/Hormone Therapy    OP LEUPROLIDE (7.5 MG EVERY MONTH)  Plan Provider: Towanda Malkin, MD     08/28/2019 - 11/26/2019 Endocrine/Hormone Therapy    OP LEUPROLIDE (ELIGARD) 22.5 MG EVERY 3 MONTHS  Plan Provider: Towanda Malkin, MD     11/06/2019 -  Cancer Staged    CT CAP:    No pulmonary metastasis.      Unchanged thoracic vertebral body lytic lesions compared to 01/28/2019.  Interval increase in pelvic and retroperitoneal lymphadenopathy, concerning for progressive metastatic disease, likely originating from prostate.     -Ill-defined posterior soft tissue of the prostate and osseous lesions of the axial and appendicular skeleton are better evaluated on prior MRI of the abdomen and pelvis. L1 vertebral body lytic lesion look unchanged today compared to 10/11/2018 PET/CT.     -Additional chronic and incidental findings as above.     11/06/2019 -  Cancer Staged    Bone scan:  New focal increase in radiotracer uptake in multiple osseous structures such as the left frontal bone, multiple thoracic/lumbar vertebrae and left 11th rib. These are suspicious for potential sites of metastatic disease however degenerative/posttraumatic changes cannot be excluded. R     11/26/2019 - 11/26/2019 Endocrine/Hormone Therapy    OP LEUPROLIDE (LUPRON) 22.5 MG EVERY 3 MONTHS  Plan Provider: Towanda Malkin, MD     03/03/2020 - 03/03/2020 Endocrine/Hormone Therapy    OP LEUPROLIDE (ELIGARD) 45 MG EVERY 6 MONTHS  Plan Provider: Maurie Boettcher, MD     09/02/2020 Endocrine/Hormone Therapy    OP PROSTATE LEUPROLIDE (ELIGARD) 45 MG EVERY 6 MONTHS  Plan Provider: Maurie Boettcher, MD     Adenocarcinoma of esophagus (CMS-HCC)   08/31/2018 Initial Diagnosis    Adenocarcinoma of esophagus (CMS-HCC)  08/31/2018 Biopsy    EGD/Colonoscopy:  Distal esophagus: Fragment of adenocarcinoma, and fragments of gastric oxyntic mucosa with healing erosive gastritis.  Stomach antrum: Healing erosive gastritis, negative for H. pylori, dysplasia or malignancy. 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       10/11/2018 -  Cancer Staged    PET CT:  - Circumferential thickening of the distal esophagus with increased FDG uptake, likely correlating to site of known malignancy.  - Focal uptake in the prostate to the right of midline likely represents prostate malignancy.  - Left para-aortic lymph nodes with mild FDG uptake, similar in size to prior MRI. These may be related to prostate cancer given low avidity.  - Redemonstrated multifocal osseous metastases; only the L1 vertebral body metastasis is metabolically active. Note that given low avidity, at least some of these may be related to prostate cancer rather than esophageal cancer.  - FDG uptake in the right posterior gluteal musculature, likely inflammatory; associated fluid collection on prior MRI has mostly resolved.     10/24/2018 -  Cancer Staged    EUS:  Malignant esophageal tumor was found in the lower third                      of the esophagus.                     - A mass was found in the lower third of the esophagus.                      This was staged T3 (early) N0 Mx by endosonographic                      criteria.                     - One benign lymph node was visualized in the middle                      paraesophageal mediastinum (level 31M).                     - No specimens collected.     11/26/2018 -  Chemotherapy    OP GI CARBOPLATIN/PACLITAXEL (XRT)  PACLItaxel 50 mg/m2 and CARBOplatin AUC 2: held last week of radiation due to low Segs/Plt       01/28/2019 -  Cancer Staged    CT Chest:    Diffuse distal esophageal wall thickening without discrete mass.  ??  No evidence of pulmonary metastases.  ??  Multilevel lytic vertebral body lesions, better characterized on previous PET/CT and abdominal MRI             02/26/2019 Biopsy    Esophagus, distal, biopsy  - Scant fragments of poorly differentiated invasive adenocarcinoma in a background of squamocolumnar mucosa with extensive ulceration, acute and chronic inflammation, and edema  - No intestinal metaplasia identified     04/16/2019 Biopsy    Gastroesophageal junction, biopsy:   - Cardio-oxyntic type and squamocolumnar junction mucosa with erosion, glandular atrophy and reactive epithelial changes  - Fragments of inflammatory granulation tissue consistent with ulcer  - No intestinal metaplasia, dysplasia or carcinoma identified       Previous therapy  Androgen deprivation with Leuprolide  Enzalutamide  Abiraterone    Current therapy  Darolutamide, to be stopped.    Interval history  The patient returns for scheduled follow up, accompanied by his son. Pt notes that he had a procedure with his oral surgeon Dr. Metta Clines last week, apparently involving bone graft. Pt is doing well and he doesn't have much pain in that area or elsewhere. Pt has been having mild hot flashes. No other issues or complaints noted.    Allergies:  No Known Allergies    Current Medications:    Current Outpatient Medications:   ???  albuterol (VENTOLIN HFA) 90 mcg/actuation inhaler, Inhale 2 puffs Four (4) times a day. , Disp: , Rfl:   ???  amoxicillin (AMOXIL) 500 MG capsule, Take 500 mg by mouth two (2) times a day., Disp: , Rfl:   ???  ascorbic acid (VITAMIN C) 1000 MG tablet, Take 1,000 mg by mouth daily. , Disp: , Rfl:   ???  aspirin (ECOTRIN) 81 MG tablet, Take 81 mg by mouth. Frequency:QD   Dosage:81   MG  Instructions:  Note:Dose: 81MG , Disp: , Rfl:   ???  budesonide-formoteroL (SYMBICORT) 80-4.5 mcg/actuation inhaler, INHALE 1 PUFF BY MOUTH TWICE DAILY, Disp: , Rfl:   ???  chlorhexidine (PERIDEX) 0.12 % solution, 15 mL by Oromucosal route Three (3) times a day., Disp: , Rfl:   ???  cholecalciferol, vitamin D3 25 mcg, 1,000 units,, 1,000 unit (25 mcg) tablet, Take by mouth., Disp: , Rfl:   ???  cod liver oil Oil, Take by mouth. , Disp: , Rfl:   ???  dexAMETHasone (DECADRON) 4 MG tablet, Take 4 mg by mouth Three (3) times a day., Disp: , Rfl:   ???  famotidine (PEPCID) 40 MG tablet, Take 1 tablet (40 mg total) by mouth nightly., Disp: 30 tablet, Rfl: 11  ???  furosemide (LASIX) 40 MG tablet, Take 1 tablet (40 mg total) by mouth daily. As needed for swelling., Disp: 30 tablet, Rfl: 11  ???  garlic 1 mg cap, Take by mouth daily. , Disp: , Rfl:   ???  garlic 1,000 mg cap, Take by mouth., Disp: , Rfl:   ???  ginger, zingiber officinalis, (GINGER EXTRACT) 250 mg cap, Take by mouth., Disp: , Rfl:   ???  glucosamine sulfate (GLUCOSAMINE) 500 mg Tab, Take 2 tablets by mouth once daily. , Disp: , Rfl:   ???  HYDROcodone-acetaminophen (NORCO) 5-325 mg per tablet, Take 1 tablet by mouth every six (6) hours as needed. , Disp: , Rfl:   ???  ibuprofen (MOTRIN) 800 MG tablet, TAKE 1 TABLET BY MOUTH EVERY 8 HOURS AS NEEDED FOR PAIN, Disp: 90 tablet, Rfl: 0  ???  LYCOPENE ORAL, Take by mouth daily. , Disp: , Rfl:   ???  multivitamin capsule, Take 1 capsule by mouth., Disp: , Rfl:   ???  multivitamin-Ca-iron-minerals Tab, Take 1 tablet by mouth., Disp: , Rfl:   ???  mupirocin (BACTROBAN) 2 % ointment, APPLY TOPICALLY THREE TIMES DAILY FOR 7 DAYS, Disp: , Rfl:   ???  NON FORMULARY, 1 capsule daily. [place] , Disp: , Rfl:   ???  NON FORMULARY, Take by mouth every hour as needed. Cannadidiol, CBD, extract oral, Disp: , Rfl:   ???  nystatin (MYCOSTATIN) 100,000 unit/mL suspension, SWISH AND SWALLOW BY MOUTH 4 TIMES DAILY FOR 10 DAYS, Disp: , Rfl:   ???  omeprazole (PRILOSEC) 40 MG capsule, TAKE 1 CAPSULE BY MOUTH ONCE DAILY 30 MINUTES BEFORE BREAKFAST, Disp: , Rfl:   ???  pumpkin seed extract-soy germ 300 mg cap, Take by mouth., Disp: , Rfl:   ???  tamsulosin (FLOMAX) 0.4 mg capsule, Take 1 capsule (0.4 mg total) by mouth daily., Disp: 90 capsule, Rfl: 3  ???  traMADoL (ULTRAM) 50 mg tablet, Take 50 mg by mouth every six (6) hours as needed. , Disp: , Rfl:   ???  UNABLE TO FIND, Med Name: Taking moringa 1,000 mg daily, Baobab po daily and Sour Sop leaves tea daily to fight cancer, Disp: , Rfl:   ???  UNABLE TO FIND, Med Name: black seed oil, Disp: , Rfl:   ???  UNABLE TO FIND, Take 1 tablet by mouth daily with evening meal. Med Name: Prostate Complete, Disp: , Rfl:   ???  UNABLE TO FIND, Beet root capsule, Disp: , Rfl:   ???  UNABLE TO FIND, Take 1 capsule by mouth daily. Med Name: pumpkin seed oil , Disp: , Rfl:   ???  vitamin E 400 UNIT capsule, Take 400 Units by mouth daily., Disp: , Rfl:     Past Medical History and Social History  Past Medical History:   Diagnosis Date   ??? Congenital vascular abnormality    ??? COPD (chronic obstructive pulmonary disease) (CMS-HCC)    ??? Impotence of organic origin    ??? Inguinal hernia without mention of obstruction or gangrene, unilateral or unspecified, (not specified as recurrent)    ??? Malignant neoplasm of prostate (CMS-HCC)     01/16/2006 Gleason's 7 ( 4  3) right face, right mid medial, right mid lateral in left mid lateral high-grade PIN.  PSA 5.7 with 6% free PSA    ??? 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 Unspecified congenital anomaly of urinary system    ??? Unspecified hemorrhoids without mention of complication       Past Surgical History:   Procedure Laterality Date   ??? HEMORRHOID SURGERY     ??? IR INSERT PORT AGE GREATER THAN 5 YRS  11/13/2018    IR INSERT PORT AGE GREATER THAN 5 YRS 11/13/2018 Maree Erie, MD IMG [facility]   ??? PR ENDOSCOPIC US EXAM, ESOPH N/A 10/31/2018    Procedure: UGI ENDOSCOPY; WITH ENDOSCOPIC ULTRASOUND EXAMINATION LIMITED TO THE ESOPHAGUS;  Surgeon: Chriss Driver, MD;  [facility];  Service: Gastroenterology   ??? PR UPPER GI ENDOSCOPY,BIOPSY N/A 02/26/2019    Procedure: UGI ENDOSCOPY; WITH BIOPSY, SINGLE OR MULTIPLE;  Surgeon: Chriss Driver, MD;  Location: [facility];  Service: Gastroenterology   ??? PR UPPER GI ENDOSCOPY,BIOPSY N/A 04/16/2019    Procedure: UGI ENDOSCOPY; WITH BIOPSY, SINGLE OR MULTIPLE;  Surgeon: Chriss Driver, MD;  Location: [facility];  Service: Gastroenterology   ??? PR UPPER GI ENDOSCOPY,BIOPSY N/A 07/15/2019    Procedure: UGI ENDOSCOPY; WITH BIOPSY, SINGLE OR MULTIPLE;  Surgeon: Maris Berger, MD;  Location: [facility]ED[facility];  Service: Gastroenterology   ??? PR UPPER GI ENDOSCOPY,DIAGNOSIS N/A 01/21/2020    Procedure: UGI ENDO, INCLUDE ESOPHAGUS, STOMACH, & DUODENUM &/OR JEJUNUM; DX W/WO COLLECTION SPECIMN, BY BRUSH OR WASH;  Surgeon: Neysa Hotter, MD;  Location: [facility];  Service: Gastroenterology   ??? PROSTATE CRYOABLATION      03/23/2006 left nerve sparing   ??? Seminal Vessicle Biopsy      12/22/2009        Social History     Occupational History   ??? Not on file   Tobacco Use   ???  Smoking status: Current Some Day Smoker     Packs/day: 1.00     Years: 50.00     Pack years: 50.00     Types: Cigars   ??? Smokeless tobacco: Never Used   ??? Tobacco comment: 1 cigar a day to every two day   Vaping Use   ??? Vaping Use: Never used   Substance and Sexual Activity   ??? Alcohol use: Yes     Alcohol/week: 19.0 standard drinks     Types: 12 Cans of beer, 7 Shots of liquor per week   ??? Drug use: Not Currently     Types: Marijuana     Comment: Rarely   ??? Sexual activity: Yes     Partners: Female   Pt is married. He is accompanied by his son.     Family History  Family History   Problem Relation Age of Onset   ??? Prostate cancer Brother    ??? Heart disease Mother    ??? Diabetes Mother    ??? Diabetes Sister    ??? Hypertension Sister    ??? Heart disease Brother    ??? Sarcoidosis Son    ??? Cancer Paternal Uncle    ??? Kidney cancer Neg Hx    ??? GU problems Neg Hx    ??? Urolithiasis Neg Hx    H/o prostate cancer in brother, who is alive with cancer at age 57 currently.  Pt has a son and a daughter, aged 49 and 79.      Review of Systems:  A comprehensive review of 10 systems was negative except for pertinent positives noted in HPI.    Physical Examination:    VITAL SIGNS:  BP 119/65  - Pulse 67  - Temp 36.8 ??C (98.2 ??F) (Oral)  - Resp 16  - Ht 175.3 cm (5' 9)  - Wt 95.5 kg (210 lb 9.6 oz)  - SpO2 99%  - BMI 31.10 kg/m??   ECOG Performance Status:  GENERAL: Well-developed, well-nourished patient in no acute distress.  HEAD: Normocephalic and atraumatic.  EYES: Conjunctivae are normal. No scleral icterus.  MOUTH/THROAT: Oropharynx is clear and moist. Poor dentition.   NECK: Supple, no thyromegaly.  LYMPHATICS: No palpable cervical, supraclavicular, or axillary adenopathy. Palpable submandibular mass (about 2 cm in size), chronic  CARDIOVASCULAR: Normal rate, regular rhythm and normal heart sounds.  Exam reveals no gallop and no friction rub.  No murmur heard.  PULMONARY/CHEST: Effort normal and breath sounds normal. No respiratory distress.  GASTROINTESTINAL/ABDOMINAL:  Soft. There is no distension. There is no tenderness. There is no rebound and no guarding.  MUSCULOSKELETAL: No clubbing, cyanosis, or lower extremity edema.  PSYCHIATRIC: Alert and oriented.  Normal mood and affect.  NEUROLOGIC: No focal motor deficit. Normal gait.  SKIN: Skin is warm, dry, and intact.      Results/Orders:    Office Visit on 07/14/2020   Component Date Value Ref Range Status   ??? Sodium 07/14/2020 138  135 - 145 mmol/L Final   ??? Potassium 07/14/2020 3.7  3.5 - 5.1 mmol/L Final   ??? Chloride 07/14/2020 103  98 - 107 mmol/L Final   ??? Anion Gap 07/14/2020 11  5 - 14 mmol/L Final   ??? CO2 07/14/2020 24.0  20.0 - 31.0 mmol/L Final   ??? BUN 07/14/2020 17  9 - 23 mg/dL Final   ??? Creatinine 07/14/2020 0.78  0.70 - 1.30 mg/dL Final   ??? BUN/Creatinine Ratio 07/14/2020 22   Final   ???  EGFR CKD-EPI Non-African American,* 07/14/2020 88  >=60 mL/min/1.93m2 Final   ??? EGFR CKD-EPI African American, Male 07/14/2020 >90  >=60 mL/min/1.42m2 Final   ??? Glucose 07/14/2020 97  70 - 179 mg/dL Final   ??? Calcium 16/06/9603 9.5  8.7 - 10.4 mg/dL Final   ??? Albumin 54/05/8118 3.5  3.4 - 5.0 g/dL Final   ??? Total Protein 07/14/2020 6.6  5.7 - 8.2 g/dL Final   ??? Total Bilirubin 07/14/2020 0.8  0.3 - 1.2 mg/dL Final   ??? AST 14/78/2956 23  <=34 U/L Final   ??? ALT 07/14/2020 11  10 - 49 U/L Final   ??? Alkaline Phosphatase 07/14/2020 56  46 - 116 U/L Final   Lab on 07/14/2020   Component Date Value Ref Range Status   ??? PSA 07/14/2020 1,054.84* 0.00 - 4.00 ng/mL Final   ??? Phosphorus 07/14/2020 3.1  2.4 - 5.1 mg/dL Final   ??? Creatinine 07/14/2020 0.74  0.70 - 1.30 mg/dL Final   ??? EGFR CKD-EPI Non-African American,* 07/14/2020 90  >=60 mL/min/1.6m2 Final   ??? EGFR CKD-EPI African American, Male 07/14/2020 >90  >=60 mL/min/1.61m2 Final   ??? Calcium 07/14/2020 9.4  8.7 - 10.4 mg/dL Final   ??? Albumin 21/30/8657 3.6  3.4 - 5.0 g/dL Final   ??? WBC 84/69/6295 5.0  4.5 - 11.0 10*9/L Final   ??? RBC 07/14/2020 3.75* 4.50 - 5.90 10*12/L Final   ??? HGB 07/14/2020 11.0* 13.5 - 17.5 g/dL Final   ??? HCT 28/41/3244 34.2* 41.0 - 53.0 % Final   ??? MCV 07/14/2020 91.3  80.0 - 100.0 fL Final   ??? MCH 07/14/2020 29.4  26.0 - 34.0 pg Final   ??? MCHC 07/14/2020 32.2  31.0 - 37.0 g/dL Final   ??? RDW 09/14/7251 15.2* 12.0 - 15.0 % Final   ??? MPV 07/14/2020 7.7  7.0 - 10.0 fL Final   ??? Platelet 07/14/2020 375  150 - 440 10*9/L Final   ??? Neutrophils % 07/14/2020 65.7  % Final   ??? Lymphocytes % 07/14/2020 21.0  % Final   ??? Monocytes % 07/14/2020 5.6  % Final   ??? Eosinophils % 07/14/2020 5.3  % Final   ??? Basophils % 07/14/2020 0.3  % Final   ??? Absolute Neutrophils 07/14/2020 3.3  2.0 - 7.5 10*9/L Final   ??? Absolute Lymphocytes 07/14/2020 1.0* 1.5 - 5.0 10*9/L Final   ??? Absolute Monocytes 07/14/2020 0.3  0.2 - 0.8 10*9/L Final   ??? Absolute Eosinophils 07/14/2020 0.3  0.0 - 0.4 10*9/L Final   ??? Absolute Basophils 07/14/2020 0.0  0.0 - 0.1 10*9/L Final   ??? Large Unstained Cells 07/14/2020 2  0 - 4 % Final       Lab Results   Component Value Date    PSA 1,054.84 (H) 07/14/2020    PSA 925.26 (H) 06/09/2020    PSA 456.00 (H) 03/03/2020    PSA 393.00 (H) 01/21/2020    PSA 411.00 (H) 12/31/2019    PSA 421.00 (H) 12/10/2019    PSA 384.00 (H) 11/26/2019    PSA 280.00 (H) 10/29/2019    PSA 46.30 (H) 08/28/2019    PSA 153.00 (H) 07/31/2019     Pre-treatment baseline PSA for darolutamide is 411 on 4/20.        Orders placed or performed in visit on 07/14/20   ??? Comprehensive Metabolic Panel   ??? Clinic Appointment Request Lab, Physician   ??? Clinic Appointment Request Physician   ??? LAB  Appointment Request   ??? Infusion Appointment Request     Molecular Pathology  Germline testing [facility] on 02/17/2020  Neg    Imaging results:  NM Bone Scan 11/03/19:  New focal increase in radiotracer uptake in multiple osseous structures with include but not limited to: Left parietal bone, T7, T9, L1 and posterior left 11th rib.  ??  Degenerative changes noted in the bilateral shoulders, right hip, bilateral knees and right foot.  ??  Focal increase in radiotracer uptake in the mandible left of midline which may represent dental disease.  ??  Normal physiologic radiotracer uptake noted in the region of the diffuse kidney and urinary bladder.    CT CAP 11/02/19:  HEPATOBILIARY: Redemonstration of numerous hypoattenuating hepatic lesions measuring up to 2.8 cm (1:29), grossly unchanged from prior favor to represent hepatic cysts versus biliary hamartomas. Additional subcentimeter hypoattenuating lesions are too small to characterize accurately on CT but grossly unchanged from prior. The gallbladder is present and otherwise unremarkable. No biliary dilatation.    SPLEEN: Unremarkable.  PANCREAS: Unremarkable.  ??  ADRENALS: Mild thickening of the left adrenal gland, unchanged from prior. Unremarkable right adrenal gland.  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 No hydronephrosis.  ??  BLADDER: Unremarkable.  PELVIC/REPRODUCTIVE ORGANS: Heterogeneously enhancing prostate with ill-defined soft tissue posteriorly.  ??  GI TRACT: No dilated or thick walled loops of bowel. Duodenal diverticulosis. Colonic diverticulosis without evidence of diverticulitis. Normal appendix.  ??  PERITONEUM/RETROPERITONEUM AND MESENTERY: No free air or fluid.  LYMPH NODES: Enlarged retroperitoneal lymph nodes (for example, 1:54) including a lymph node conglomerate measuring up to 4.7 x 2.8 cm (1:66). An additional 1.6 cm enlarged left internal iliac lymph node (1.5).  ??  VESSELS: The aorta is normal in caliber.  No significant calcified atherosclerotic disease. The portal venous system is patent. The hepatic veins and IVC are unremarkable.  ??  BONES AND SOFT TISSUES: Grossly unchanged lytic lesion of the L1 vertebral body (1:46). Other previously noted osseous metastases seen on prior MRI are not well evaluated here. No new suspicious lytic or blastic osseous lesions. Multilevel degenerative changes of the spine. Bilateral small fat-containing inguinal hernias.    CT CAP 06/23/2020  New mediastinal and bilateral hilar lymphadenopathy, concerning for nodal metastasis.  ??  No new or suspicious pulmonary nodules.  --Slightly increased wall thickening the distal esophagus.  --Interval worsening of diffuse abdominopelvic metastatic adenopathy, as above.  --Increased size and heterogeneous enhancement of the prostate and left greater than right seminal vesicles, concerning for concurrent primary versus metastatic malignancy. Nodular soft tissue in the region of the the left seminal vesicle measures 3 .9 x 3.3 centimeters and abuts the anterior rectum.  --New subcentimeter left retroperitoneal soft tissue nodule adjacent to the left adrenal gland.  --Overall no significant interval change in size, number, and distribution of osseous lesions as described above, with osseous metastatic disease not excluded.     Bone scan 06/23/2020  New abnormal focal increase in radiotracer uptake involving multiple osseous structures which include: Anterolateral left third rib, posterior left ninth rib, posterior right 10th and 12th ribs, and right iliac adjacent the SI joint.  Multiple new abnormal focal increases in radiotracer uptake in multiple osseous structures as described above. These are concerning for new sites of disease.  ??  Interval increase in the extent of abnormal radiotracer uptake involving the mandible compared prior imaging. Recommend further evaluation with diagnostic CT.  ??  Persistent abnormal increase  in radiotracer uptake involving multiple osseous structures as described above. These appear to be similar when compared prior.

## 2020-07-16 NOTE — Unmapped (Signed)
Called and let pt a message. Per Dr. Philomena Course, his PSA was a little higher as expected.Will recheck at next visit. Ask pt to call back with any further questions or concerns.

## 2020-07-20 NOTE — Unmapped (Signed)
This patient has been disenrolled from the [facility] specialty pharmacy services due to medication discontinuation resulting from progression of disease - darolutamide discontinued.    Kermit Balo  [facility] Specialty Pharmacist

## 2020-08-11 ENCOUNTER — Ambulatory Visit
Admit: 2020-08-11 | Discharge: 2020-08-11 | Payer: MEDICARE | Attending: Hematology & Oncology | Primary: Hematology & Oncology

## 2020-08-11 ENCOUNTER — Ambulatory Visit: Admit: 2020-08-11 | Discharge: 2020-08-11 | Payer: MEDICARE

## 2020-08-11 ENCOUNTER — Other Ambulatory Visit: Admit: 2020-08-11 | Discharge: 2020-08-11 | Payer: MEDICARE

## 2020-08-11 DIAGNOSIS — C61 Malignant neoplasm of prostate: Principal | ICD-10-CM

## 2020-08-11 DIAGNOSIS — C7951 Secondary malignant neoplasm of bone: Principal | ICD-10-CM

## 2020-08-11 DIAGNOSIS — J449 Chronic obstructive pulmonary disease, unspecified: Principal | ICD-10-CM

## 2020-08-11 DIAGNOSIS — F17200 Nicotine dependence, unspecified, uncomplicated: Principal | ICD-10-CM

## 2020-08-11 DIAGNOSIS — C772 Secondary and unspecified malignant neoplasm of intra-abdominal lymph nodes: Principal | ICD-10-CM

## 2020-08-11 DIAGNOSIS — M8628 Subacute osteomyelitis, other site: Principal | ICD-10-CM

## 2020-08-13 DIAGNOSIS — M272 Inflammatory conditions of jaws: Principal | ICD-10-CM

## 2020-08-16 DIAGNOSIS — C61 Malignant neoplasm of prostate: Principal | ICD-10-CM

## 2020-09-06 ENCOUNTER — Ambulatory Visit (INDEPENDENT_AMBULATORY_CARE_PROVIDER_SITE_OTHER): Payer: Medicare Other

## 2020-09-06 ENCOUNTER — Encounter: Payer: Self-pay | Admitting: Emergency Medicine

## 2020-09-06 ENCOUNTER — Ambulatory Visit
Admission: EM | Admit: 2020-09-06 | Discharge: 2020-09-06 | Disposition: A | Discharge status: Home / Self Care | Payer: Medicare Other | Attending: Sports Medicine | Admitting: Sports Medicine

## 2020-09-06 ENCOUNTER — Other Ambulatory Visit: Payer: Self-pay

## 2020-09-06 DIAGNOSIS — Z79899 Other long term (current) drug therapy: Secondary | ICD-10-CM | POA: Insufficient documentation

## 2020-09-06 DIAGNOSIS — F17210 Nicotine dependence, cigarettes, uncomplicated: Secondary | ICD-10-CM | POA: Insufficient documentation

## 2020-09-06 DIAGNOSIS — R0789 Other chest pain: Secondary | ICD-10-CM

## 2020-09-06 DIAGNOSIS — Z7951 Long term (current) use of inhaled steroids: Secondary | ICD-10-CM | POA: Diagnosis not present

## 2020-09-06 DIAGNOSIS — Z8546 Personal history of malignant neoplasm of prostate: Secondary | ICD-10-CM | POA: Insufficient documentation

## 2020-09-06 DIAGNOSIS — E785 Hyperlipidemia, unspecified: Secondary | ICD-10-CM | POA: Insufficient documentation

## 2020-09-06 DIAGNOSIS — J449 Chronic obstructive pulmonary disease, unspecified: Secondary | ICD-10-CM | POA: Diagnosis not present

## 2020-09-06 DIAGNOSIS — R079 Chest pain, unspecified: Secondary | ICD-10-CM

## 2020-09-06 DIAGNOSIS — R051 Acute cough: Secondary | ICD-10-CM | POA: Diagnosis not present

## 2020-09-06 DIAGNOSIS — Z20822 Contact with and (suspected) exposure to covid-19: Secondary | ICD-10-CM | POA: Insufficient documentation

## 2020-09-06 DIAGNOSIS — R0781 Pleurodynia: Secondary | ICD-10-CM | POA: Diagnosis not present

## 2020-09-06 DIAGNOSIS — Z7982 Long term (current) use of aspirin: Secondary | ICD-10-CM | POA: Insufficient documentation

## 2020-09-06 DIAGNOSIS — Z85830 Personal history of malignant neoplasm of bone: Secondary | ICD-10-CM | POA: Diagnosis not present

## 2020-09-06 LAB — TROPONIN I (HIGH SENSITIVITY): Troponin I (High Sensitivity): 12 ng/L (ref <18)

## 2020-09-06 LAB — CBC WITH DIFFERENTIAL/PLATELET
Abs Immature Granulocytes: 0.02 10*3/uL (ref 0.00–0.07)
Basophils Absolute: 0 10*3/uL (ref 0.0–0.1)
Basophils Relative: 0 %
Eosinophils Absolute: 0.2 10*3/uL (ref 0.0–0.5)
Eosinophils Relative: 2 %
HCT: 30.7 % — ABNORMAL LOW (ref 39.0–52.0)
Hemoglobin: 9.9 g/dL — ABNORMAL LOW (ref 13.0–17.0)
Immature Granulocytes: 0 %
Lymphocytes Relative: 16 %
Lymphs Abs: 1.2 10*3/uL (ref 0.7–4.0)
MCH: 28.8 pg (ref 26.0–34.0)
MCHC: 32.2 g/dL (ref 30.0–36.0)
MCV: 89.2 fL (ref 80.0–100.0)
Monocytes Absolute: 0.5 10*3/uL (ref 0.1–1.0)
Monocytes Relative: 7 %
Neutro Abs: 5.4 10*3/uL (ref 1.7–7.7)
Neutrophils Relative %: 75 %
Platelets: 337 10*3/uL (ref 150–400)
RBC: 3.44 MIL/uL — ABNORMAL LOW (ref 4.22–5.81)
RDW: 14.5 % (ref 11.5–15.5)
WBC: 7.3 10*3/uL (ref 4.0–10.5)
nRBC: 0 % (ref 0.0–0.2)

## 2020-09-06 LAB — RESP PANEL BY RT-PCR (FLU A&B, COVID) ARPGX2
Influenza A by PCR: NEGATIVE
Influenza B by PCR: NEGATIVE
SARS Coronavirus 2 by RT PCR: NEGATIVE

## 2020-09-06 LAB — COMPREHENSIVE METABOLIC PANEL
ALT: 12 U/L (ref 0–44)
AST: 29 U/L (ref 15–41)
Albumin: 3.6 g/dL (ref 3.5–5.0)
Alkaline Phosphatase: 59 U/L (ref 38–126)
Anion gap: 10 (ref 5–15)
BUN: 18 mg/dL (ref 8–23)
CO2: 29 mmol/L (ref 22–32)
Calcium: 10 mg/dL (ref 8.9–10.3)
Chloride: 96 mmol/L — ABNORMAL LOW (ref 98–111)
Creatinine, Ser: 0.82 mg/dL (ref 0.61–1.24)
GFR, Estimated: >60 mL/min (ref >60)
Glucose, Bld: 95 mg/dL (ref 70–99)
Potassium: 3.9 mmol/L (ref 3.5–5.1)
Sodium: 135 mmol/L (ref 135–145)
Total Bilirubin: 0.6 mg/dL (ref 0.3–1.2)
Total Protein: 7.4 g/dL (ref 6.5–8.1)

## 2020-09-06 MED ORDER — LIDOCAINE VISCOUS HCL 2 % MT SOLN
15.0000 mL | Freq: Once | OROMUCOSAL | Status: AC
  Administered 2020-09-06: 15 mL via ORAL

## 2020-09-06 MED ORDER — HYDROCODONE-ACETAMINOPHEN 5-325 MG PO TABS: 1.0000 | ORAL_TABLET | Freq: Four times a day (QID) | ORAL | 0 refills | Status: AC | PRN

## 2020-09-06 MED ORDER — ALUM & MAG HYDROXIDE-SIMETH 200-200-20 MG/5ML PO SUSP
30.0000 mL | Freq: Once | ORAL | Status: AC
  Administered 2020-09-06: 30 mL via ORAL

## 2020-09-06 NOTE — ED Triage Notes (Addendum)
Patient in today c/o chest pain x 10 days. Patient states the pain is sometimes under his right arm, sometimes under his left arm and sometimes in the middle of his back. Patient states that sometimes he doesn't have pain. Patient thought he has gas. Patient alka seltzer plus and it went away for 1-2 days. Patient denies any cardiac history. Patient states the pain is worse with movement.

## 2020-09-06 NOTE — Discharge Instructions (Signed)
Your Covid and flu test were negative.  Your chest x-ray does not show any acute abnormalities.  You have underlying bronchitis.  There are no rib fractures or pneumonia seen.  An EKG was normal.  Labs were also done which showed your cardiac markers were normal.  You do have low hemoglobin so this is something that you should follow-up with your PCP or oncologist with as soon as possible.  They may want to recheck this and see if you possibly need iron.  I suspect that your chest and rib as well as back pain are due to musculoskeletal pain.  This may or may not be part of the bone cancer.  Try to rest and increase your fluid intake.  I have sent a medication for your pain.  Follow-up with your PCP sometime later this week or next week for recheck.  Go to the emergency department if you have any acute worsening of your chest pain or back pain.  Call EMS or go to ED if you have any weakness or breathing difficulty.

## 2020-09-06 NOTE — ED Provider Notes (Signed)
[facility]    CSN: [id number] Arrival date & time: 09/06/20  1544      History   Chief Complaint Chief Complaint  Patient presents with  . Chest Pain    HPI Angel Moses is a 74 y.o. male presenting for 1 week history of bilateral rib and shoulder blade pain.  He says that it comes and goes.  He says over the past 2-3 days or so he has had some pain in the center of his chest and in the middle of his back.   Patient states that the pain comes and goes and sometimes he does not have any pain at all.  Pain is worse with movement.  He says he does not have any pain right now until he moves. Denies pain on breathing.  Denies any pain with eating or drinking.  Also admits to mild cough for the past couple days.  Denies any fever, increased fatigue, weakness, palpitations, SOB, dizziness, weakness, nasal congestion, sore throat. Denies injury. Denies n/v.  Alka-Seltzer seem to improve the pain once. Denies history of cardiac problems. No hx of DVT or PE.  Patient denies any known COVID-19 or influenza exposure.  Past medical history is significant for COPD, hyperlipidemia, and cancer.  Patient has history of prostate cancer and bone cancer.  He is currently followed by hematologist/oncologist.   HPI  Past Medical History:  Diagnosis Date  . Cancer [facility])    Prostate Cancer  . Cancer ([place])    Bone and Bone Marrow  . COPD (chronic obstructive pulmonary disease) ([place])   . Elevated PSA   . GERD (gastroesophageal reflux disease)   . Hemorrhoids   . Hyperlipidemia     There are no problems to display for this patient.   Past Surgical History:  Procedure Laterality Date  . COLONOSCOPY WITH PROPOFOL N/A 08/31/2018   Procedure: COLONOSCOPY WITH PROPOFOL;  Surgeon: Manya Silvas, MD;  Location: [facility] ENDOSCOPY;  Service: Endoscopy;  Laterality: N/A;  . ESOPHAGOGASTRODUODENOSCOPY (EGD) WITH PROPOFOL N/A 08/31/2018   Procedure: ESOPHAGOGASTRODUODENOSCOPY (EGD) WITH PROPOFOL;   Surgeon: Manya Silvas, MD;  Location: [facility] ENDOSCOPY;  Service: Endoscopy;  Laterality: N/A;  . HEMORRHOID SURGERY    . PROSTATE CRYOABLATION         Home Medications    Prior to Admission medications   Medication Sig Start Date End Date Taking? Authorizing Provider  albuterol (PROVENTIL HFA;VENTOLIN HFA) 108 (90 Base) MCG/ACT inhaler Inhale 2 puffs into the lungs every 6 (six) hours as needed for wheezing or shortness of breath.   Yes [provider]  Ascorbic Acid (VITAMIN C) 1000 MG tablet Take 1,000 mg by mouth daily.   Yes [provider]  aspirin EC 81 MG tablet Take 81 mg by mouth daily.   Yes [provider]  budesonide-formoterol (SYMBICORT) 80-4.5 MCG/ACT inhaler Inhale 2 puffs into the lungs 2 (two) times daily.   Yes [provider]  calcium carbonate (OS-CAL - DOSED IN MG OF ELEMENTAL CALCIUM) 1250 (500 Ca) MG tablet Take 1 tablet by mouth 2 (two) times daily with a meal.   Yes [provider]  Cholecalciferol 25 MCG (1000 UT) capsule Take 1,000 Units by mouth daily.   Yes [provider]  COD LIVER OIL PO Take 1 capsule by mouth daily.   Yes [provider]  enzalutamide Gillermina Phy) 40 MG capsule Take 40 mg by mouth daily.   Yes [provider]  famotidine (PEPCID) 40 MG tablet  Take 40 mg by mouth daily. 07/05/20  Yes [provider]  furosemide (LASIX) 40 MG tablet Take 1 tablet by mouth daily as needed for edema. 08/28/19  Yes [provider]  Garlic 10 MG CAPS Take 1 capsule by mouth daily.   Yes [provider]  Ginger, Zingiber officinalis, (GINGER EXTRACT) 250 MG CAPS Take 1 capsule by mouth daily.   Yes [provider]  Glucosamine 500 MG CAPS Take 1 capsule by mouth daily.   Yes [provider]  ibuprofen (ADVIL,MOTRIN) 800 MG tablet Take 800 mg by mouth every 8 (eight) hours as needed.   Yes [provider]  Multiple Vitamin (MULTIVITAMIN)  tablet Take 1 tablet by mouth daily.   Yes [provider]  omeprazole (PRILOSEC) 20 MG capsule Take 20 mg by mouth daily.   Yes [provider]  PUMPKIN SEED PO Take 1 capsule by mouth daily.   Yes [provider]  saw palmetto 160 MG capsule Take 160 mg by mouth 2 (two) times daily.   Yes [provider]  traMADol (ULTRAM) 50 MG tablet Take 50 mg by mouth every 6 (six) hours as needed. 05/07/20  Yes [provider]  vitamin E 400 UNIT capsule Take 400 Units by mouth daily.   Yes [provider]  CHLOROPHYLL PO Take 1 capsule by mouth daily.    [provider]  HYDROcodone-acetaminophen (NORCO/VICODIN) 5-325 MG tablet Take 1 tablet by mouth every 6 (six) hours as needed for up to 5 days. 09/06/20 09/11/20  Danton Clap, PA-C    Family History Family History  Problem Relation Age of Onset  . Diabetes Mother   . Diabetes Father   . Cancer Other     Social History Social History   Tobacco Use  . Smoking status: Current Some Day Smoker    Packs/day: 0.25    Years: 25.00    Pack years: 6.25    Types: Cigarettes  . Smokeless tobacco: Never Used  Vaping Use  . Vaping Use: Never used  Substance Use Topics  . Alcohol use: Yes    Alcohol/week: 0.0 standard drinks    Comment: 12 pack  . Drug use: No     Allergies   Patient has no known allergies.   Review of Systems Review of Systems  Constitutional: Positive for fatigue. Negative for fever.  HENT: Negative for congestion, rhinorrhea, sinus pressure, sinus pain and sore throat.   Respiratory: Positive for cough. Negative for chest tightness, shortness of breath and wheezing.   Cardiovascular: Positive for chest pain. Negative for palpitations and leg swelling.  Gastrointestinal: Negative for abdominal pain, diarrhea, nausea and vomiting.  Musculoskeletal: Positive for arthralgias, back pain and myalgias.  Neurological: Negative for weakness, light-headedness and  headaches.  Hematological: Negative for adenopathy.     Physical Exam Triage Vital Signs ED Triage Vitals  Enc Vitals Group     BP 09/06/20 1558 132/84     Pulse Rate 09/06/20 1558 83     Resp 09/06/20 1558 18     Temp 09/06/20 1558 98.4 F (36.9 C)     Temp src --      SpO2 09/06/20 1558 96 %     Weight 09/06/20 1559 210 lb (95.3 kg)     Height 09/06/20 1559 5\' 11"  (1.803 m)     Head Circumference --      Peak Flow --      Pain Score 09/06/20 1557 5  Pain Loc --      Pain Edu? --      Excl. in [place]? --    No data found.  Updated Vital Signs BP 132/84 (BP Location: Left Arm)   Pulse 83   Temp 98.4 F (36.9 C)   Resp 18   Ht 5\' 11"  (1.803 m)   Wt 210 lb (95.3 kg)   SpO2 96%   BMI 29.29 kg/m       Physical Exam Vitals and nursing note reviewed.  Constitutional:      General: He is not in acute distress.    Appearance: Normal appearance. He is well-developed and well-nourished. He is not ill-appearing, toxic-appearing or diaphoretic.  HENT:     Head: Normocephalic and atraumatic.     Nose: Nose normal.     Mouth/Throat:     Mouth: Mucous membranes are moist.     Pharynx: Oropharynx is clear.  Eyes:     General: No scleral icterus.    Conjunctiva/sclera: Conjunctivae normal.     Pupils: Pupils are equal, round, and reactive to light.  Cardiovascular:     Rate and Rhythm: Normal rate and regular rhythm.     Heart sounds: Normal heart sounds. No murmur heard.   Pulmonary:     Effort: Pulmonary effort is normal. No respiratory distress.     Breath sounds: Normal breath sounds. No wheezing, rhonchi or rales.  Chest:     Chest wall: No tenderness.  Abdominal:     Palpations: Abdomen is soft.     Tenderness: There is no abdominal tenderness. There is no right CVA tenderness, left CVA tenderness or guarding.  Musculoskeletal:        General: No edema.     Cervical back: Neck supple.     Right lower leg: No edema.  Skin:    General: Skin is warm and dry.   Neurological:     General: No focal deficit present.     Mental Status: He is alert. Mental status is at baseline.     Motor: No weakness.     Gait: Gait normal.  Psychiatric:        Mood and Affect: Mood and affect and mood normal.        Behavior: Behavior normal.        Thought Content: Thought content normal.      UC Treatments / Results  Labs (all labs ordered are listed, but only abnormal results are displayed) Labs Reviewed  CBC WITH DIFFERENTIAL/PLATELET - Abnormal; Notable for the following components:      Result Value   RBC 3.44 (*)    Hemoglobin 9.9 (*)    HCT 30.7 (*)    All other components within normal limits  COMPREHENSIVE METABOLIC PANEL - Abnormal; Notable for the following components:   Chloride 96 (*)    All other components within normal limits  RESP PANEL BY RT-PCR (FLU A&B, COVID) ARPGX2  TROPONIN I (HIGH SENSITIVITY)    EKG   Radiology DG Chest 2 View  Result Date: 09/06/2020 CLINICAL DATA:  74 year old male with chest pain. EXAM: CHEST - 2 VIEW COMPARISON:  Chest radiograph dated 12/22/2015. FINDINGS: Right-sided Port-A-Cath with tip at the cavoatrial junction. There is background of mild chronic bronchitic changes. No focal consolidation, pleural effusion or pneumothorax. The cardiac silhouette is within limits. Atherosclerotic calcification of the aorta. No acute osseous pathology. IMPRESSION: No active cardiopulmonary disease. Electronically Signed   By: Anner Crete M.D.   On:  09/06/2020 16:38    Procedures ED EKG  Date/Time: 09/06/2020 5:17 PM Performed by: Danton Clap, PA-C Authorized by: Verda Cumins, MD   ECG reviewed by ED Physician in the absence of a cardiologist: yes   Previous ECG:    Previous ECG:  Unavailable Interpretation:    Interpretation: normal   Rate:    ECG rate:  81   ECG rate assessment: normal   Rhythm:    Rhythm: sinus rhythm   Ectopy:    Ectopy: none   QRS:    QRS axis:  Normal ST segments:     ST segments:  Normal T waves:    T waves: normal   Comments:     Normal sinus rhythm and regular rate   (including critical care time)  Medications Ordered in UC Medications  alum & mag hydroxide-simeth (MAALOX/MYLANTA) 200-200-20 MG/5ML suspension 30 mL (30 mLs Oral Given 09/06/20 1642)    And  lidocaine (XYLOCAINE) 2 % viscous mouth solution 15 mL (15 mLs Oral Given 09/06/20 1642)    Initial Impression / Assessment and Plan / UC Course  I have reviewed the triage vital signs and the nursing notes.  Pertinent labs & imaging results that were available during my care of the patient were reviewed by me and considered in my medical decision making (see chart for details).   74 year old male presenting for 1 week history of bilateral rib and shoulder blade pain.  Over the past 2 to 3 days he has had pain radiating to the chest.  Pain is worse on the right side of the chest.  Pain worse with movement.  Denies other exacerbating factors.  Vital signs are normal and stable in the clinic.  He is well.  And in no acute distress.  He seems to only have the pain when he twists or turns.  He has some pain when he bends forward and returns to standing position.  No tenderness of the chest or her back on exam.  Chest is clear to auscultation heart regular rate and rhythm.  EKG is normal.  Chest x-ray is normal.  Respiratory panel all negative.  Troponin negative.  CMP is normal.  CBC shows decreased hemoglobin.  Patient does follow-up with hematologist/oncologist for prostate cancer and bone cancer.  Advised him to call both his PCP and oncologist tomorrow to set up follow-up appointments.   Suspect his pain is likely musculoskeletal.  Advised him that the pain may be due to his underlying bone cancer.  Treating at this time with as needed for pain relief.  ED precautions reviewed with patient for any new or worsening symptoms.  If his pain worsens at all or start to have any other symptoms including  pain that radiates to his neck/jaw/extremities, dizziness, weakness, palpitations or heart fluttering, vomiting, sweats, weakness, pain on breathing or worsening pain you should call EMS or go to ED.  Patient agreeable.  Final Clinical Impressions(s) / UC Diagnoses   Final diagnoses:  Chest wall pain  Rib pain on right side     Discharge Instructions     Your Covid and flu test were negative.  Your chest x-ray does not show any acute abnormalities.  You have underlying bronchitis.  There are no rib fractures or pneumonia seen.  An EKG was normal.  Labs were also done which showed your cardiac markers were normal.  You do have low hemoglobin so this is something that you should follow-up with your PCP or oncologist with  as soon as possible.  They may want to recheck this and see if you possibly need iron.  I suspect that your chest and rib as well as back pain are due to musculoskeletal pain.  This may or may not be part of the bone cancer.  Try to rest and increase your fluid intake.  I have sent a medication for your pain.  Follow-up with your PCP sometime later this week or next week for recheck.  Go to the emergency department if you have any acute worsening of your chest pain or back pain.  Call EMS or go to ED if you have any weakness or breathing difficulty.    ED Prescriptions    Medication Sig Dispense Auth. Provider   HYDROcodone-acetaminophen (NORCO/VICODIN) 5-325 MG tablet Take 1 tablet by mouth every 6 (six) hours as needed for up to 5 days. 15 tablet Danton Clap, PA-C     I have reviewed the PDMP during this encounter.   Danton Clap, PA-C 09/07/20 2039

## 2020-09-09 MED ORDER — METOCLOPRAMIDE 5 MG TABLET
Freq: Four times a day (QID) | ORAL | 0.00000 days
Start: 2020-09-09 — End: 2020-10-20

## 2020-09-15 ENCOUNTER — Ambulatory Visit
Admit: 2020-09-15 | Discharge: 2020-09-15 | Payer: MEDICARE | Attending: Hematology & Oncology | Primary: Hematology & Oncology

## 2020-09-15 ENCOUNTER — Other Ambulatory Visit: Admit: 2020-09-15 | Discharge: 2020-09-15 | Payer: MEDICARE

## 2020-09-15 ENCOUNTER — Institutional Professional Consult (permissible substitution): Admit: 2020-09-15 | Discharge: 2020-09-15 | Payer: MEDICARE

## 2020-09-15 DIAGNOSIS — Q279 Congenital malformation of peripheral vascular system, unspecified: Principal | ICD-10-CM

## 2020-09-15 DIAGNOSIS — J449 Chronic obstructive pulmonary disease, unspecified: Principal | ICD-10-CM

## 2020-09-15 DIAGNOSIS — N529 Male erectile dysfunction, unspecified: Principal | ICD-10-CM

## 2020-09-15 DIAGNOSIS — F17290 Nicotine dependence, other tobacco product, uncomplicated: Principal | ICD-10-CM

## 2020-09-15 DIAGNOSIS — Q631 Lobulated, fused and horseshoe kidney: Principal | ICD-10-CM

## 2020-09-15 DIAGNOSIS — Z7982 Long term (current) use of aspirin: Principal | ICD-10-CM

## 2020-09-15 DIAGNOSIS — K7689 Other specified diseases of liver: Principal | ICD-10-CM

## 2020-09-15 DIAGNOSIS — C7951 Secondary malignant neoplasm of bone: Principal | ICD-10-CM

## 2020-09-15 DIAGNOSIS — C61 Malignant neoplasm of prostate: Principal | ICD-10-CM

## 2020-09-15 DIAGNOSIS — Z79899 Other long term (current) drug therapy: Principal | ICD-10-CM

## 2020-09-15 DIAGNOSIS — M47816 Spondylosis without myelopathy or radiculopathy, lumbar region: Principal | ICD-10-CM

## 2020-09-15 DIAGNOSIS — Q632 Ectopic kidney: Principal | ICD-10-CM

## 2020-09-15 DIAGNOSIS — C772 Secondary and unspecified malignant neoplasm of intra-abdominal lymph nodes: Principal | ICD-10-CM

## 2020-09-15 DIAGNOSIS — K649 Unspecified hemorrhoids: Principal | ICD-10-CM

## 2020-09-15 DIAGNOSIS — Z8042 Family history of malignant neoplasm of prostate: Principal | ICD-10-CM

## 2020-09-15 DIAGNOSIS — C159 Malignant neoplasm of esophagus, unspecified: Principal | ICD-10-CM

## 2020-09-15 DIAGNOSIS — M272 Inflammatory conditions of jaws: Principal | ICD-10-CM

## 2020-09-15 DIAGNOSIS — Z7951 Long term (current) use of inhaled steroids: Principal | ICD-10-CM

## 2020-09-15 DIAGNOSIS — M47812 Spondylosis without myelopathy or radiculopathy, cervical region: Principal | ICD-10-CM

## 2020-09-15 DIAGNOSIS — M87180 Osteonecrosis due to drugs, jaw: Principal | ICD-10-CM

## 2020-09-15 DIAGNOSIS — K5900 Constipation, unspecified: Principal | ICD-10-CM

## 2020-09-15 MED ORDER — SENNOSIDES 8.6 MG TABLET
ORAL_TABLET | Freq: Two times a day (BID) | ORAL | 3 refills | 30 days | Status: CP
Start: 2020-09-15 — End: 2021-09-15

## 2020-09-17 DIAGNOSIS — F17290 Nicotine dependence, other tobacco product, uncomplicated: Principal | ICD-10-CM

## 2020-09-17 DIAGNOSIS — Z923 Personal history of irradiation: Principal | ICD-10-CM

## 2020-09-17 DIAGNOSIS — D649 Anemia, unspecified: Principal | ICD-10-CM

## 2020-09-17 DIAGNOSIS — Z7982 Long term (current) use of aspirin: Principal | ICD-10-CM

## 2020-09-17 DIAGNOSIS — Z8501 Personal history of malignant neoplasm of esophagus: Principal | ICD-10-CM

## 2020-09-17 DIAGNOSIS — Z8042 Family history of malignant neoplasm of prostate: Principal | ICD-10-CM

## 2020-09-17 DIAGNOSIS — Z79899 Other long term (current) drug therapy: Principal | ICD-10-CM

## 2020-09-17 DIAGNOSIS — C61 Malignant neoplasm of prostate: Principal | ICD-10-CM

## 2020-09-17 DIAGNOSIS — K219 Gastro-esophageal reflux disease without esophagitis: Principal | ICD-10-CM

## 2020-09-17 DIAGNOSIS — K4090 Unilateral inguinal hernia, without obstruction or gangrene, not specified as recurrent: Principal | ICD-10-CM

## 2020-09-17 DIAGNOSIS — T50905A Adverse effect of unspecified drugs, medicaments and biological substances, initial encounter: Principal | ICD-10-CM

## 2020-09-17 DIAGNOSIS — Z9221 Personal history of antineoplastic chemotherapy: Principal | ICD-10-CM

## 2020-09-17 DIAGNOSIS — J309 Allergic rhinitis, unspecified: Principal | ICD-10-CM

## 2020-09-17 DIAGNOSIS — Z6830 Body mass index (BMI) 30.0-30.9, adult: Principal | ICD-10-CM

## 2020-09-17 DIAGNOSIS — Z7951 Long term (current) use of inhaled steroids: Principal | ICD-10-CM

## 2020-09-17 DIAGNOSIS — E669 Obesity, unspecified: Principal | ICD-10-CM

## 2020-09-17 DIAGNOSIS — Q279 Congenital malformation of peripheral vascular system, unspecified: Principal | ICD-10-CM

## 2020-09-17 DIAGNOSIS — Z9225 Personal history of immunosupression therapy: Principal | ICD-10-CM

## 2020-09-17 DIAGNOSIS — M87180 Osteonecrosis due to drugs, jaw: Principal | ICD-10-CM

## 2020-09-17 DIAGNOSIS — Q2549 Other congenital malformations of aorta: Principal | ICD-10-CM

## 2020-09-17 DIAGNOSIS — J449 Chronic obstructive pulmonary disease, unspecified: Principal | ICD-10-CM

## 2020-09-17 DIAGNOSIS — E785 Hyperlipidemia, unspecified: Principal | ICD-10-CM

## 2020-09-17 DIAGNOSIS — C7951 Secondary malignant neoplasm of bone: Principal | ICD-10-CM

## 2020-09-17 DIAGNOSIS — Q649 Congenital malformation of urinary system, unspecified: Principal | ICD-10-CM

## 2020-09-29 MED ORDER — IBUPROFEN 800 MG TABLET
ORAL_TABLET | Freq: Three times a day (TID) | ORAL | 0 refills | 30 days | PRN
Start: 2020-09-29 — End: ?

## 2020-09-30 DIAGNOSIS — C61 Malignant neoplasm of prostate: Principal | ICD-10-CM

## 2020-09-30 MED ORDER — IBUPROFEN 800 MG TABLET
ORAL_TABLET | Freq: Three times a day (TID) | ORAL | 1 refills | 30 days | Status: CP | PRN
Start: 2020-09-30 — End: ?

## 2020-10-13 ENCOUNTER — Ambulatory Visit: Admit: 2020-10-13 | Discharge: 2020-10-13 | Payer: MEDICARE

## 2020-10-13 DIAGNOSIS — Q649 Congenital malformation of urinary system, unspecified: Principal | ICD-10-CM

## 2020-10-13 DIAGNOSIS — K4090 Unilateral inguinal hernia, without obstruction or gangrene, not specified as recurrent: Principal | ICD-10-CM

## 2020-10-13 DIAGNOSIS — Z7951 Long term (current) use of inhaled steroids: Principal | ICD-10-CM

## 2020-10-13 DIAGNOSIS — Z923 Personal history of irradiation: Principal | ICD-10-CM

## 2020-10-13 DIAGNOSIS — Z6830 Body mass index (BMI) 30.0-30.9, adult: Principal | ICD-10-CM

## 2020-10-13 DIAGNOSIS — M87180 Osteonecrosis due to drugs, jaw: Principal | ICD-10-CM

## 2020-10-13 DIAGNOSIS — C7951 Secondary malignant neoplasm of bone: Principal | ICD-10-CM

## 2020-10-13 DIAGNOSIS — E785 Hyperlipidemia, unspecified: Principal | ICD-10-CM

## 2020-10-13 DIAGNOSIS — T50905A Adverse effect of unspecified drugs, medicaments and biological substances, initial encounter: Principal | ICD-10-CM

## 2020-10-13 DIAGNOSIS — Z7982 Long term (current) use of aspirin: Principal | ICD-10-CM

## 2020-10-13 DIAGNOSIS — Q279 Congenital malformation of peripheral vascular system, unspecified: Principal | ICD-10-CM

## 2020-10-13 DIAGNOSIS — E669 Obesity, unspecified: Principal | ICD-10-CM

## 2020-10-13 DIAGNOSIS — J449 Chronic obstructive pulmonary disease, unspecified: Principal | ICD-10-CM

## 2020-10-13 DIAGNOSIS — Q2549 Other congenital malformations of aorta: Principal | ICD-10-CM

## 2020-10-13 DIAGNOSIS — J309 Allergic rhinitis, unspecified: Principal | ICD-10-CM

## 2020-10-13 DIAGNOSIS — K219 Gastro-esophageal reflux disease without esophagitis: Principal | ICD-10-CM

## 2020-10-13 DIAGNOSIS — Z8501 Personal history of malignant neoplasm of esophagus: Principal | ICD-10-CM

## 2020-10-13 DIAGNOSIS — Z79899 Other long term (current) drug therapy: Principal | ICD-10-CM

## 2020-10-13 DIAGNOSIS — C61 Malignant neoplasm of prostate: Principal | ICD-10-CM

## 2020-10-13 DIAGNOSIS — Z9225 Personal history of immunosupression therapy: Principal | ICD-10-CM

## 2020-10-13 DIAGNOSIS — F17290 Nicotine dependence, other tobacco product, uncomplicated: Principal | ICD-10-CM

## 2020-10-13 DIAGNOSIS — Z8042 Family history of malignant neoplasm of prostate: Principal | ICD-10-CM

## 2020-10-13 DIAGNOSIS — D649 Anemia, unspecified: Principal | ICD-10-CM

## 2020-10-13 DIAGNOSIS — Z9221 Personal history of antineoplastic chemotherapy: Principal | ICD-10-CM

## 2020-10-16 DIAGNOSIS — C61 Malignant neoplasm of prostate: Principal | ICD-10-CM

## 2020-10-16 MED ORDER — FUROSEMIDE 40 MG TABLET
ORAL_TABLET | Freq: Every day | ORAL | 11 refills | 30 days | Status: CP
Start: 2020-10-16 — End: 2021-10-16

## 2020-10-19 DIAGNOSIS — C159 Malignant neoplasm of esophagus, unspecified: Principal | ICD-10-CM

## 2020-10-20 ENCOUNTER — Encounter: Admit: 2020-10-20 | Discharge: 2020-10-20 | Payer: MEDICARE | Attending: Anesthesiology | Primary: Anesthesiology

## 2020-10-20 ENCOUNTER — Encounter: Admit: 2020-10-20 | Discharge: 2020-10-20 | Payer: MEDICARE | Attending: General Practice | Primary: General Practice

## 2020-10-20 ENCOUNTER — Ambulatory Visit: Admit: 2020-10-20 | Discharge: 2020-10-20 | Payer: MEDICARE

## 2020-10-20 DIAGNOSIS — Z7951 Long term (current) use of inhaled steroids: Principal | ICD-10-CM

## 2020-10-20 DIAGNOSIS — Z7982 Long term (current) use of aspirin: Principal | ICD-10-CM

## 2020-10-20 DIAGNOSIS — C61 Malignant neoplasm of prostate: Principal | ICD-10-CM

## 2020-10-20 DIAGNOSIS — Z9221 Personal history of antineoplastic chemotherapy: Principal | ICD-10-CM

## 2020-10-20 DIAGNOSIS — Q649 Congenital malformation of urinary system, unspecified: Principal | ICD-10-CM

## 2020-10-20 DIAGNOSIS — Q2549 Other congenital malformations of aorta: Principal | ICD-10-CM

## 2020-10-20 DIAGNOSIS — T50905A Adverse effect of unspecified drugs, medicaments and biological substances, initial encounter: Principal | ICD-10-CM

## 2020-10-20 DIAGNOSIS — Z8042 Family history of malignant neoplasm of prostate: Principal | ICD-10-CM

## 2020-10-20 DIAGNOSIS — Z79899 Other long term (current) drug therapy: Principal | ICD-10-CM

## 2020-10-20 DIAGNOSIS — J449 Chronic obstructive pulmonary disease, unspecified: Principal | ICD-10-CM

## 2020-10-20 DIAGNOSIS — M87180 Osteonecrosis due to drugs, jaw: Principal | ICD-10-CM

## 2020-10-20 DIAGNOSIS — Z6830 Body mass index (BMI) 30.0-30.9, adult: Principal | ICD-10-CM

## 2020-10-20 DIAGNOSIS — K219 Gastro-esophageal reflux disease without esophagitis: Principal | ICD-10-CM

## 2020-10-20 DIAGNOSIS — C7951 Secondary malignant neoplasm of bone: Principal | ICD-10-CM

## 2020-10-20 DIAGNOSIS — J309 Allergic rhinitis, unspecified: Principal | ICD-10-CM

## 2020-10-20 DIAGNOSIS — K4090 Unilateral inguinal hernia, without obstruction or gangrene, not specified as recurrent: Principal | ICD-10-CM

## 2020-10-20 DIAGNOSIS — D649 Anemia, unspecified: Principal | ICD-10-CM

## 2020-10-20 DIAGNOSIS — Z9225 Personal history of immunosupression therapy: Principal | ICD-10-CM

## 2020-10-20 DIAGNOSIS — Z8501 Personal history of malignant neoplasm of esophagus: Principal | ICD-10-CM

## 2020-10-20 DIAGNOSIS — Q279 Congenital malformation of peripheral vascular system, unspecified: Principal | ICD-10-CM

## 2020-10-20 DIAGNOSIS — F17290 Nicotine dependence, other tobacco product, uncomplicated: Principal | ICD-10-CM

## 2020-10-20 DIAGNOSIS — E785 Hyperlipidemia, unspecified: Principal | ICD-10-CM

## 2020-10-20 DIAGNOSIS — Z923 Personal history of irradiation: Principal | ICD-10-CM

## 2020-10-20 DIAGNOSIS — E669 Obesity, unspecified: Principal | ICD-10-CM

## 2020-10-20 MED ORDER — FAMOTIDINE 40 MG TABLET
ORAL_TABLET | 0 refills | 0 days
Start: 2020-10-20 — End: ?

## 2020-10-20 MED ORDER — OXYCODONE 5 MG TABLET
ORAL_TABLET | ORAL | 0 refills | 2.00000 days | Status: CP | PRN
Start: 2020-10-20 — End: 2020-10-25
  Filled 2020-10-20: qty 10, 1d supply, fill #0

## 2020-10-20 MED ORDER — AMOXICILLIN 500 MG CAPSULE
ORAL_CAPSULE | Freq: Three times a day (TID) | ORAL | 0 refills | 7.00000 days | Status: CP
Start: 2020-10-20 — End: 2020-10-27
  Filled 2020-10-20: qty 21, 7d supply, fill #0

## 2020-10-20 MED ORDER — CHLORHEXIDINE GLUCONATE 0.12 % MOUTHWASH
Freq: Two times a day (BID) | OROMUCOSAL | 0 refills | 16.00000 days | Status: CP
Start: 2020-10-20 — End: ?
  Filled 2020-10-20: qty 473, 16d supply, fill #0

## 2020-11-05 ENCOUNTER — Ambulatory Visit: Admit: 2020-11-05 | Discharge: 2020-11-09 | Payer: MEDICARE

## 2020-11-05 ENCOUNTER — Ambulatory Visit: Admit: 2020-11-05 | Discharge: 2020-11-18 | Payer: MEDICARE

## 2020-11-10 ENCOUNTER — Other Ambulatory Visit: Admit: 2020-11-10 | Discharge: 2020-11-11 | Payer: MEDICARE

## 2020-11-10 ENCOUNTER — Ambulatory Visit
Admit: 2020-11-10 | Discharge: 2020-11-11 | Payer: MEDICARE | Attending: Hematology & Oncology | Primary: Hematology & Oncology

## 2020-11-10 DIAGNOSIS — C61 Malignant neoplasm of prostate: Principal | ICD-10-CM

## 2020-11-10 DIAGNOSIS — C159 Malignant neoplasm of esophagus, unspecified: Principal | ICD-10-CM

## 2020-11-10 MED ORDER — PREDNISONE 5 MG TABLET
ORAL_TABLET | Freq: Two times a day (BID) | ORAL | 6 refills | 30 days | Status: CP
Start: 2020-11-10 — End: ?

## 2020-11-10 MED ORDER — PROCHLORPERAZINE MALEATE 10 MG TABLET
ORAL_TABLET | Freq: Four times a day (QID) | ORAL | 4 refills | 8 days | Status: CP | PRN
Start: 2020-11-10 — End: ?

## 2020-11-10 MED ORDER — DEXAMETHASONE 4 MG TABLET
ORAL_TABLET | ORAL | 4 refills | 126 days | Status: CP
Start: 2020-11-10 — End: ?

## 2020-11-17 ENCOUNTER — Ambulatory Visit: Admit: 2020-11-17 | Discharge: 2020-11-18 | Payer: MEDICARE

## 2020-11-17 ENCOUNTER — Other Ambulatory Visit: Admit: 2020-11-17 | Discharge: 2020-11-18 | Payer: MEDICARE

## 2020-11-17 DIAGNOSIS — C61 Malignant neoplasm of prostate: Principal | ICD-10-CM

## 2020-12-08 ENCOUNTER — Other Ambulatory Visit: Admit: 2020-12-08 | Discharge: 2020-12-09 | Payer: MEDICARE

## 2020-12-08 ENCOUNTER — Ambulatory Visit: Admit: 2020-12-08 | Discharge: 2020-12-09 | Payer: MEDICARE

## 2020-12-08 ENCOUNTER — Ambulatory Visit
Admit: 2020-12-08 | Discharge: 2020-12-09 | Payer: MEDICARE | Attending: Hematology & Oncology | Primary: Hematology & Oncology

## 2020-12-08 DIAGNOSIS — C7951 Secondary malignant neoplasm of bone: Principal | ICD-10-CM

## 2020-12-08 DIAGNOSIS — C61 Malignant neoplasm of prostate: Principal | ICD-10-CM

## 2020-12-29 ENCOUNTER — Other Ambulatory Visit: Admit: 2020-12-29 | Discharge: 2020-12-30 | Payer: MEDICARE

## 2020-12-29 ENCOUNTER — Ambulatory Visit
Admit: 2020-12-29 | Discharge: 2020-12-30 | Payer: MEDICARE | Attending: Hematology & Oncology | Primary: Hematology & Oncology

## 2020-12-29 ENCOUNTER — Ambulatory Visit: Admit: 2020-12-29 | Discharge: 2020-12-30 | Payer: MEDICARE

## 2020-12-29 DIAGNOSIS — C61 Malignant neoplasm of prostate: Principal | ICD-10-CM

## 2020-12-29 DIAGNOSIS — C159 Malignant neoplasm of esophagus, unspecified: Principal | ICD-10-CM

## 2020-12-29 DIAGNOSIS — C7951 Secondary malignant neoplasm of bone: Principal | ICD-10-CM

## 2021-01-09 MED ORDER — TAMSULOSIN 0.4 MG CAPSULE
Freq: Every day | ORAL | 0 days
Start: 2021-01-09 — End: ?

## 2021-01-20 ENCOUNTER — Other Ambulatory Visit: Admit: 2021-01-20 | Discharge: 2021-01-21 | Payer: MEDICARE

## 2021-01-20 ENCOUNTER — Ambulatory Visit: Admit: 2021-01-20 | Discharge: 2021-01-21 | Payer: MEDICARE | Attending: Family | Primary: Family

## 2021-01-20 ENCOUNTER — Ambulatory Visit: Admit: 2021-01-20 | Discharge: 2021-01-21 | Payer: MEDICARE

## 2021-01-20 DIAGNOSIS — C61 Malignant neoplasm of prostate: Principal | ICD-10-CM

## 2021-01-20 DIAGNOSIS — C7951 Secondary malignant neoplasm of bone: Principal | ICD-10-CM

## 2021-01-20 MED ORDER — DEXAMETHASONE 4 MG TABLET
ORAL_TABLET | ORAL | 4 refills | 126 days | Status: CP
Start: 2021-01-20 — End: ?

## 2021-01-26 ENCOUNTER — Ambulatory Visit: Admit: 2021-01-26 | Discharge: 2021-02-01 | Payer: MEDICARE

## 2021-01-26 ENCOUNTER — Ambulatory Visit: Admit: 2021-01-26 | Discharge: 2021-01-27 | Payer: MEDICARE

## 2021-01-26 DIAGNOSIS — C61 Malignant neoplasm of prostate: Principal | ICD-10-CM

## 2021-02-10 ENCOUNTER — Ambulatory Visit: Admit: 2021-02-10 | Discharge: 2021-02-11 | Payer: MEDICARE | Attending: Family | Primary: Family

## 2021-02-10 ENCOUNTER — Ambulatory Visit: Admit: 2021-02-10 | Discharge: 2021-02-11 | Payer: MEDICARE

## 2021-02-10 ENCOUNTER — Other Ambulatory Visit: Admit: 2021-02-10 | Discharge: 2021-02-11 | Payer: MEDICARE

## 2021-02-10 DIAGNOSIS — C61 Malignant neoplasm of prostate: Principal | ICD-10-CM

## 2021-03-10 DIAGNOSIS — C61 Malignant neoplasm of prostate: Principal | ICD-10-CM

## 2021-03-14 DIAGNOSIS — C61 Malignant neoplasm of prostate: Principal | ICD-10-CM

## 2021-03-16 ENCOUNTER — Ambulatory Visit: Admit: 2021-03-16 | Discharge: 2021-03-16 | Payer: MEDICARE

## 2021-03-16 ENCOUNTER — Other Ambulatory Visit: Admit: 2021-03-16 | Discharge: 2021-03-16 | Payer: MEDICARE

## 2021-03-16 ENCOUNTER — Ambulatory Visit
Admit: 2021-03-16 | Discharge: 2021-03-16 | Payer: MEDICARE | Attending: Hematology & Oncology | Primary: Hematology & Oncology

## 2021-03-16 ENCOUNTER — Institutional Professional Consult (permissible substitution): Admit: 2021-03-16 | Discharge: 2021-03-16 | Payer: MEDICARE

## 2021-03-16 DIAGNOSIS — C7951 Secondary malignant neoplasm of bone: Principal | ICD-10-CM

## 2021-03-16 DIAGNOSIS — M87180 Osteonecrosis due to drugs, jaw: Principal | ICD-10-CM

## 2021-03-16 DIAGNOSIS — C61 Malignant neoplasm of prostate: Principal | ICD-10-CM

## 2021-03-28 MED ORDER — TAMSULOSIN 0.4 MG CAPSULE
ORAL_CAPSULE | 0 refills | 0 days
Start: 2021-03-28 — End: ?

## 2021-03-29 MED ORDER — TAMSULOSIN 0.4 MG CAPSULE
ORAL_CAPSULE | 0 refills | 0 days | Status: CP
Start: 2021-03-29 — End: ?

## 2021-04-06 ENCOUNTER — Ambulatory Visit: Admit: 2021-04-06 | Discharge: 2021-04-07 | Payer: MEDICARE

## 2021-04-06 ENCOUNTER — Ambulatory Visit
Admit: 2021-04-06 | Discharge: 2021-04-07 | Payer: MEDICARE | Attending: Hematology & Oncology | Primary: Hematology & Oncology

## 2021-04-06 ENCOUNTER — Other Ambulatory Visit: Admit: 2021-04-06 | Discharge: 2021-04-07 | Payer: MEDICARE

## 2021-04-06 DIAGNOSIS — C7951 Secondary malignant neoplasm of bone: Principal | ICD-10-CM

## 2021-04-06 DIAGNOSIS — C61 Malignant neoplasm of prostate: Principal | ICD-10-CM

## 2021-04-22 ENCOUNTER — Ambulatory Visit: Admit: 2021-04-22 | Discharge: 2021-04-23 | Payer: MEDICARE

## 2021-04-22 ENCOUNTER — Ambulatory Visit: Admit: 2021-04-22 | Discharge: 2021-04-26 | Payer: MEDICARE

## 2021-04-27 ENCOUNTER — Ambulatory Visit: Admit: 2021-04-27 | Discharge: 2021-04-28 | Payer: MEDICARE

## 2021-04-27 ENCOUNTER — Other Ambulatory Visit: Admit: 2021-04-27 | Discharge: 2021-04-28 | Payer: MEDICARE

## 2021-04-27 ENCOUNTER — Ambulatory Visit
Admit: 2021-04-27 | Discharge: 2021-04-28 | Payer: MEDICARE | Attending: Hematology & Oncology | Primary: Hematology & Oncology

## 2021-04-27 DIAGNOSIS — C61 Malignant neoplasm of prostate: Principal | ICD-10-CM

## 2021-04-27 DIAGNOSIS — C7951 Secondary malignant neoplasm of bone: Principal | ICD-10-CM

## 2021-04-27 DIAGNOSIS — K5732 Diverticulitis of large intestine without perforation or abscess without bleeding: Principal | ICD-10-CM

## 2021-04-27 MED ORDER — CIPROFLOXACIN 500 MG TABLET
ORAL_TABLET | Freq: Two times a day (BID) | ORAL | 0 refills | 7 days | Status: CP
Start: 2021-04-27 — End: 2021-05-04

## 2021-04-27 MED ORDER — METRONIDAZOLE 500 MG TABLET
ORAL_TABLET | Freq: Three times a day (TID) | ORAL | 0 refills | 7 days | Status: CP
Start: 2021-04-27 — End: 2021-05-04

## 2021-05-18 MED ORDER — TAMSULOSIN 0.4 MG CAPSULE
ORAL_CAPSULE | 0 refills | 0 days | Status: CP
Start: 2021-05-18 — End: ?

## 2021-05-20 ENCOUNTER — Ambulatory Visit: Admit: 2021-05-20 | Discharge: 2021-05-21 | Payer: MEDICARE

## 2021-05-20 ENCOUNTER — Ambulatory Visit: Admit: 2021-05-20 | Discharge: 2021-05-21 | Payer: MEDICARE | Attending: Family | Primary: Family

## 2021-05-20 ENCOUNTER — Other Ambulatory Visit: Admit: 2021-05-20 | Discharge: 2021-05-21 | Payer: MEDICARE

## 2021-05-20 DIAGNOSIS — C61 Malignant neoplasm of prostate: Principal | ICD-10-CM

## 2021-05-20 DIAGNOSIS — C7951 Secondary malignant neoplasm of bone: Principal | ICD-10-CM

## 2021-05-20 MED ORDER — IBUPROFEN 800 MG TABLET
ORAL_TABLET | Freq: Three times a day (TID) | ORAL | 1 refills | 30 days | Status: CP | PRN
Start: 2021-05-20 — End: ?

## 2021-05-20 MED ORDER — TAMSULOSIN 0.4 MG CAPSULE
ORAL_CAPSULE | Freq: Every day | ORAL | 0 refills | 30.00000 days | Status: CP
Start: 2021-05-20 — End: ?

## 2021-06-10 ENCOUNTER — Ambulatory Visit: Admit: 2021-06-10 | Discharge: 2021-06-10 | Payer: MEDICARE

## 2021-06-10 ENCOUNTER — Ambulatory Visit
Admit: 2021-06-10 | Discharge: 2021-06-10 | Payer: MEDICARE | Attending: Nurse Practitioner | Primary: Nurse Practitioner

## 2021-06-10 ENCOUNTER — Other Ambulatory Visit: Admit: 2021-06-10 | Discharge: 2021-06-10 | Payer: MEDICARE

## 2021-06-10 DIAGNOSIS — C61 Malignant neoplasm of prostate: Principal | ICD-10-CM

## 2021-07-13 ENCOUNTER — Ambulatory Visit
Admit: 2021-07-13 | Discharge: 2021-07-18 | Disposition: A | Discharge status: Home Health | Payer: MEDICARE | Admitting: Trauma Surgery

## 2021-07-13 ENCOUNTER — Ambulatory Visit: Admit: 2021-07-13 | Discharge: 2021-07-14 | Payer: MEDICARE

## 2021-07-13 ENCOUNTER — Ambulatory Visit: Admit: 2021-07-13 | Discharge: 2021-07-18 | Payer: MEDICARE

## 2021-07-13 ENCOUNTER — Encounter: Admit: 2021-07-13 | Discharge: 2021-07-18 | Payer: MEDICARE | Attending: Emergency Medicine

## 2021-07-13 DIAGNOSIS — C61 Malignant neoplasm of prostate: Principal | ICD-10-CM

## 2021-07-13 MED ORDER — PREDNISONE 5 MG TABLET
ORAL_TABLET | 0 refills | 0 days
Start: 2021-07-13 — End: ?

## 2021-07-14 MED ORDER — PREDNISONE 5 MG TABLET
ORAL_TABLET | 0 refills | 0 days | Status: CP
Start: 2021-07-14 — End: ?

## 2021-07-15 ENCOUNTER — Ambulatory Visit: Admit: 2021-07-15 | Payer: MEDICARE

## 2021-07-15 ENCOUNTER — Ambulatory Visit: Admit: 2021-07-15 | Payer: MEDICARE | Attending: Hematology & Oncology | Primary: Hematology & Oncology

## 2021-07-18 MED ORDER — LEVOFLOXACIN 500 MG TABLET
ORAL_TABLET | ORAL | 0 refills | 4 days | Status: CP
Start: 2021-07-18 — End: 2021-07-22
  Filled 2021-07-18: qty 4, 4d supply, fill #0

## 2021-07-18 MED ORDER — METRONIDAZOLE 500 MG TABLET
ORAL_TABLET | Freq: Three times a day (TID) | ORAL | 0 refills | 4.00000 days | Status: CP
Start: 2021-07-18 — End: 2021-07-22
  Filled 2021-07-18: qty 12, 4d supply, fill #0

## 2021-07-26 ENCOUNTER — Ambulatory Visit: Admit: 2021-07-26 | Discharge: 2021-07-26 | Payer: MEDICARE

## 2021-07-26 DIAGNOSIS — K5780 Diverticulitis of intestine, part unspecified, with perforation and abscess without bleeding: Principal | ICD-10-CM

## 2021-07-27 DIAGNOSIS — K5780 Diverticulitis of intestine, part unspecified, with perforation and abscess without bleeding: Principal | ICD-10-CM

## 2021-07-29 ENCOUNTER — Other Ambulatory Visit: Admit: 2021-07-29 | Discharge: 2021-07-30 | Payer: MEDICARE

## 2021-07-29 ENCOUNTER — Ambulatory Visit
Admit: 2021-07-29 | Discharge: 2021-07-30 | Payer: MEDICARE | Attending: Hematology & Oncology | Primary: Hematology & Oncology

## 2021-07-29 DIAGNOSIS — R630 Anorexia: Principal | ICD-10-CM

## 2021-07-29 DIAGNOSIS — C61 Malignant neoplasm of prostate: Principal | ICD-10-CM

## 2021-07-29 MED ORDER — TAMSULOSIN 0.4 MG CAPSULE
ORAL_CAPSULE | 0 refills | 0 days
Start: 2021-07-29 — End: ?

## 2021-07-29 MED ORDER — ONDANSETRON 4 MG DISINTEGRATING TABLET
ORAL_TABLET | Freq: Four times a day (QID) | ORAL | 0 refills | 14 days | Status: CP | PRN
Start: 2021-07-29 — End: 2021-08-12

## 2021-07-29 MED ORDER — MIRTAZAPINE 15 MG TABLET
ORAL_TABLET | Freq: Every evening | ORAL | 3 refills | 90 days | Status: CP
Start: 2021-07-29 — End: 2021-08-28

## 2021-07-30 MED ORDER — TAMSULOSIN 0.4 MG CAPSULE
ORAL_CAPSULE | 0 refills | 0 days | Status: CP
Start: 2021-07-30 — End: ?

## 2021-08-01 MED ORDER — TAMSULOSIN 0.4 MG CAPSULE
ORAL_CAPSULE | 0 refills | 0 days
Start: 2021-08-01 — End: ?

## 2021-08-02 ENCOUNTER — Ambulatory Visit: Admit: 2021-08-02 | Discharge: 2021-08-03 | Payer: MEDICARE

## 2021-08-02 MED ORDER — TAMSULOSIN 0.4 MG CAPSULE
ORAL_CAPSULE | 0 refills | 0 days | Status: CP
Start: 2021-08-02 — End: ?

## 2021-08-03 DIAGNOSIS — C61 Malignant neoplasm of prostate: Principal | ICD-10-CM

## 2021-08-08 ENCOUNTER — Ambulatory Visit: Admit: 2021-08-08 | Discharge: 2021-08-09 | Payer: MEDICARE

## 2021-08-09 ENCOUNTER — Ambulatory Visit: Admit: 2021-08-09 | Discharge: 2021-08-12 | Payer: MEDICARE | Attending: Internal Medicine

## 2021-08-09 ENCOUNTER — Ambulatory Visit
Admit: 2021-08-09 | Discharge: 2021-08-20 | Disposition: A | Discharge status: Hospice | Payer: MEDICARE | Attending: Internal Medicine | Primary: Internal Medicine

## 2021-08-09 ENCOUNTER — Ambulatory Visit: Admit: 2021-08-09 | Payer: MEDICARE

## 2021-08-09 ENCOUNTER — Encounter: Admit: 2021-08-09 | Discharge: 2021-08-20 | Disposition: A | Discharge status: Hospice | Payer: MEDICARE

## 2021-08-09 ENCOUNTER — Ambulatory Visit
Admit: 2021-08-09 | Discharge: 2021-08-20 | Disposition: A | Discharge status: Hospice | Payer: MEDICARE | Attending: Radiation Oncology | Primary: Radiation Oncology

## 2021-08-09 ENCOUNTER — Ambulatory Visit: Admit: 2021-08-09 | Discharge: 2021-08-20 | Payer: MEDICARE | Attending: Radiation Oncology

## 2021-08-09 ENCOUNTER — Ambulatory Visit: Admit: 2021-08-09 | Discharge: 2021-08-20 | Disposition: A | Discharge status: Hospice | Payer: MEDICARE

## 2021-08-09 ENCOUNTER — Ambulatory Visit: Admit: 2021-08-09 | Discharge: 2021-08-20 | Payer: MEDICARE | Attending: Internal Medicine

## 2021-08-09 ENCOUNTER — Encounter: Admit: 2021-08-09 | Payer: MEDICARE

## 2021-08-09 ENCOUNTER — Ambulatory Visit: Admit: 2021-08-09 | Discharge: 2021-08-20 | Payer: MEDICARE

## 2021-08-09 ENCOUNTER — Other Ambulatory Visit: Admit: 2021-08-09 | Payer: MEDICARE

## 2021-08-09 ENCOUNTER — Encounter: Admit: 2021-08-09 | Discharge: 2021-08-20 | Payer: MEDICARE

## 2021-08-09 DIAGNOSIS — C61 Malignant neoplasm of prostate: Principal | ICD-10-CM

## 2021-08-16 ENCOUNTER — Ambulatory Visit: Admit: 2021-08-16 | Discharge: 2021-08-17 | Payer: MEDICARE | Attending: Internal Medicine | Primary: Internal Medicine

## 2021-08-16 ENCOUNTER — Ambulatory Visit: Admit: 2021-08-16 | Payer: MEDICARE | Attending: Internal Medicine | Primary: Internal Medicine

## 2021-08-18 ENCOUNTER — Ambulatory Visit: Admit: 2021-08-18 | Discharge: 2021-08-19

## 2021-08-19 ENCOUNTER — Ambulatory Visit: Admit: 2021-08-19 | Discharge: 2021-08-20

## 2021-08-20 ENCOUNTER — Ambulatory Visit: Admit: 2021-08-20 | Discharge: 2021-08-21

## 2021-08-20 MED ORDER — DEXAMETHASONE 1 MG TABLET
ORAL_TABLET | INTRAMUSCULAR | 0 refills | 0.00000 days | Status: CP
Start: 2021-08-20 — End: 2021-08-20
  Filled 2021-08-20: qty 21, 9d supply, fill #0

## 2021-08-20 MED ORDER — ONDANSETRON 4 MG DISINTEGRATING TABLET
ORAL_TABLET | Freq: Four times a day (QID) | ORAL | 0 refills | 8 days | Status: CP | PRN
Start: 2021-08-20 — End: 2021-08-20
  Filled 2021-08-20: qty 30, 8d supply, fill #0

## 2021-08-20 MED ORDER — LORAZEPAM 0.5 MG TABLET
ORAL_TABLET | Freq: Three times a day (TID) | ORAL | 0 refills | 4 days | Status: CP | PRN
Start: 2021-08-20 — End: ?
  Filled 2021-08-20: qty 10, 4d supply, fill #0

## 2021-08-20 MED ORDER — OXYCODONE 5 MG TABLET
ORAL_TABLET | ORAL | 0 refills | 2 days | Status: CP | PRN
Start: 2021-08-20 — End: ?
  Filled 2021-08-20: qty 10, 2d supply, fill #0

## 2021-08-20 MED ORDER — SENNOSIDES 8.6 MG TABLET
ORAL_TABLET | Freq: Every day | ORAL | 0 refills | 30 days | Status: CP
Start: 2021-08-20 — End: 2021-09-19
  Filled 2021-08-20: qty 30, 30d supply, fill #0

## 2021-08-21 MED ORDER — POLYETHYLENE GLYCOL 3350 17 GRAM/DOSE ORAL POWDER
Freq: Every day | ORAL | 0 refills | 30 days | Status: CP
Start: 2021-08-21 — End: 2021-09-20
  Filled 2021-08-20: qty 238, 14d supply, fill #0

## 2021-08-26 ENCOUNTER — Ambulatory Visit: Admit: 2021-08-26 | Discharge: 2021-08-27 | Payer: MEDICARE

## 2021-08-26 ENCOUNTER — Other Ambulatory Visit: Admit: 2021-08-26 | Discharge: 2021-08-27 | Payer: MEDICARE

## 2021-08-26 ENCOUNTER — Ambulatory Visit
Admit: 2021-08-26 | Discharge: 2021-08-27 | Payer: MEDICARE | Attending: Hematology & Oncology | Primary: Hematology & Oncology

## 2021-08-26 DIAGNOSIS — C61 Malignant neoplasm of prostate: Principal | ICD-10-CM

## 2021-08-26 DIAGNOSIS — C778 Secondary and unspecified malignant neoplasm of lymph nodes of multiple regions: Principal | ICD-10-CM

## 2021-08-26 DIAGNOSIS — C7931 Secondary malignant neoplasm of brain: Principal | ICD-10-CM

## 2021-08-26 DIAGNOSIS — C7951 Secondary malignant neoplasm of bone: Principal | ICD-10-CM

## 2021-09-12 DIAGNOSIS — C61 Malignant neoplasm of prostate: Principal | ICD-10-CM

## 2021-09-16 ENCOUNTER — Institutional Professional Consult (permissible substitution): Admit: 2021-09-16 | Discharge: 2021-09-17 | Payer: MEDICARE

## 2021-09-16 DIAGNOSIS — C61 Malignant neoplasm of prostate: Principal | ICD-10-CM

## 2021-10-10 MED ORDER — TAMSULOSIN 0.4 MG CAPSULE
ORAL_CAPSULE | 0 refills | 0 days
Start: 2021-10-10 — End: ?

## 2021-10-11 MED ORDER — TAMSULOSIN 0.4 MG CAPSULE
ORAL_CAPSULE | 0 refills | 0 days | Status: CP
Start: 2021-10-11 — End: ?

## 2021-11-06 IMAGING — CT CT MAXILLOFACIAL W/O CM
1 series · 1 of 1 positions shown · non-contrast
Comparison: None.

CLINICAL DATA: Left jaw pain 8 weeks. Anterior jaw pain. History of
prostate cancer.

EXAM:
CT MAXILLOFACIAL WITHOUT CONTRAST
TECHNIQUE: Multidetector CT imaging of the maxillofacial structures was
performed. Multiplanar CT image reconstructions were also generated.

[Series 3: topogram 0.60 sag · sagittal · 1.00mm/px · 1 of 1 slices shown]
[im 1/1]
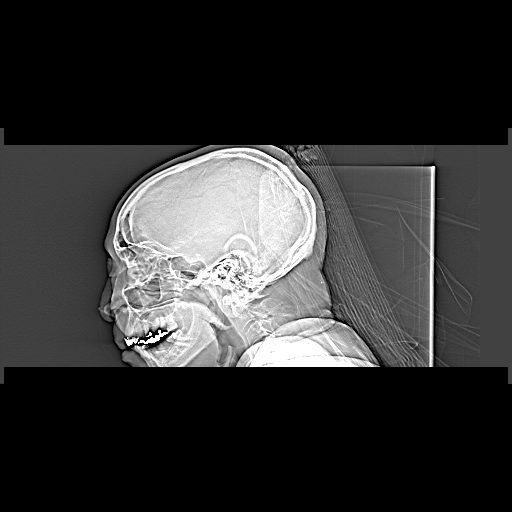

[1 of 1 positions shown; findings below may reference images not displayed]

FINDINGS: Osseous: Lytic lesion in the symphysis of the mandible. There is
periosteal new bone formation and a probable pathologic fracture
through the lesion. On the sagittal view, the fracture appears to
extend superiorly to the root of the incisor. No periapical dental
abscess identified in this area.

Remainder of the mandible normal. TMJ normal bilaterally. No acute
dental infection identified. Normal maxilla.

Orbits: No orbital mass or edema.  Bony orbit intact.

Sinuses: Mild mucosal edema paranasal sinuses. No air-fluid level
identified.

Soft tissues: Question mild soft tissue swelling anterior to the
symphysis of the mandible. No mass or adenopathy.

Limited intracranial: Negative
IMPRESSION: Lytic lesion in the symphysis the mandible with probable pathologic
fracture. There is cortical erosion with periosteal new bone
formation. Question mild surrounding soft tissue swelling.
Differential diagnosis includes osteonecrosis. Correlate with
biphosphonate use. Radiation necrosis and infection also in the
differential.

## 2021-11-19 ENCOUNTER — Ambulatory Visit: Admit: 2021-11-19 | Payer: MEDICARE | Attending: Internal Medicine | Primary: Internal Medicine

## 2021-11-19 ENCOUNTER — Ambulatory Visit: Admit: 2021-11-19 | Payer: MEDICARE

## 2021-12-11 DEATH — deceased

## 2022-02-18 IMAGING — CR DG CHEST 2V
2 series · 3 of 3 positions shown · non-contrast
Comparison: Chest radiograph dated 12/22/2015.

CLINICAL DATA: 74-year-old male with chest pain.

EXAM:
CHEST - 2 VIEW

[Series 1: chest pa · 0.14mm/px · 2 of 2 slices shown]
[im 1/2]
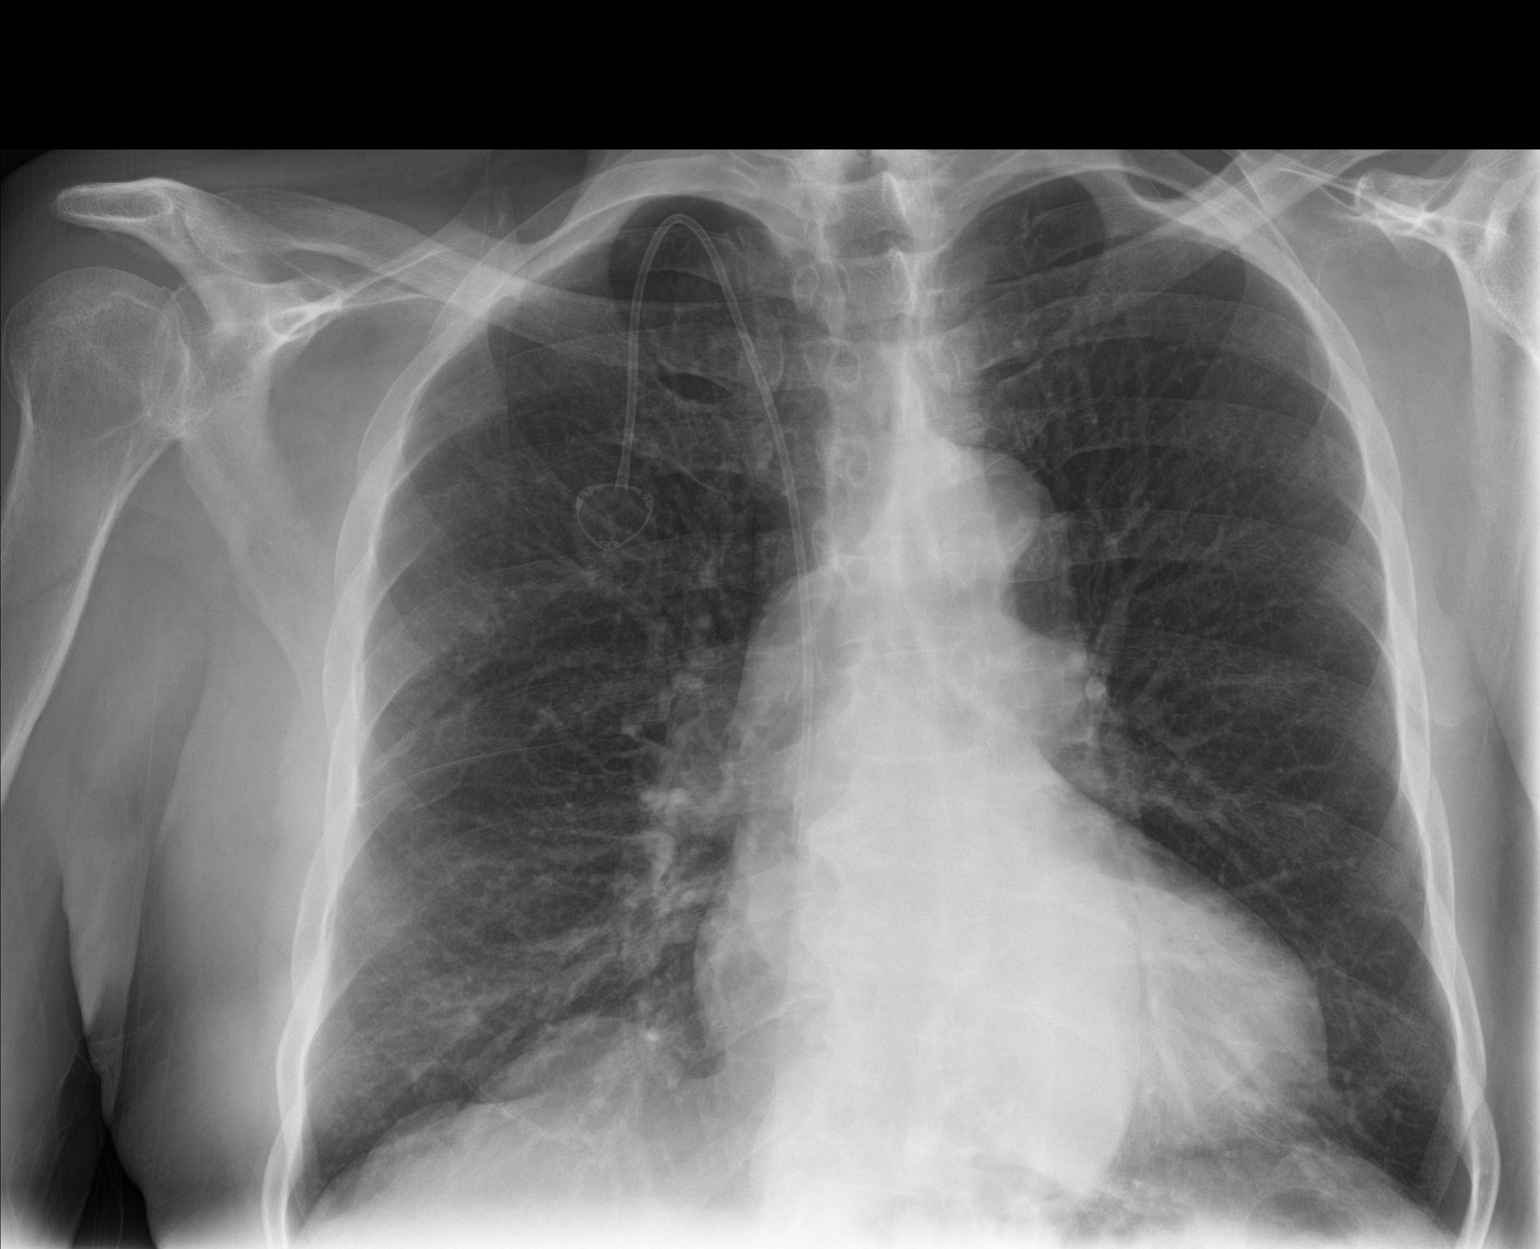
[im 2/2]
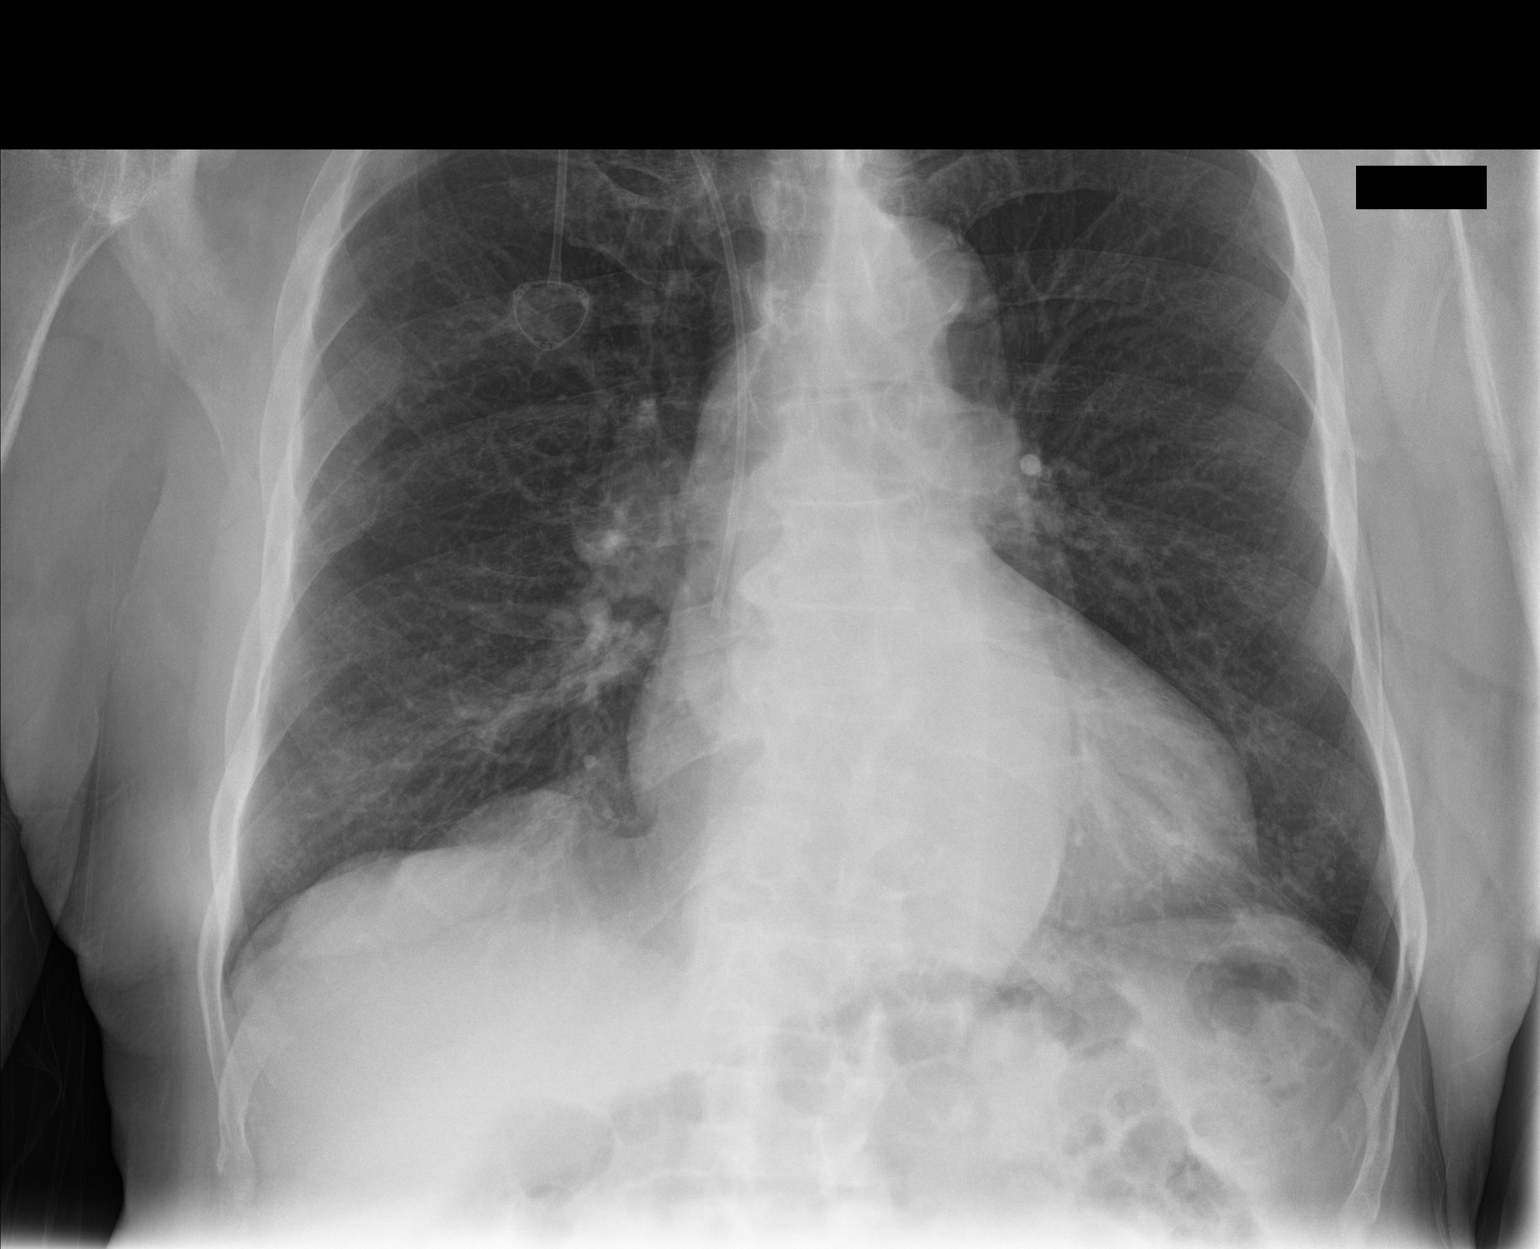

[chest lat]
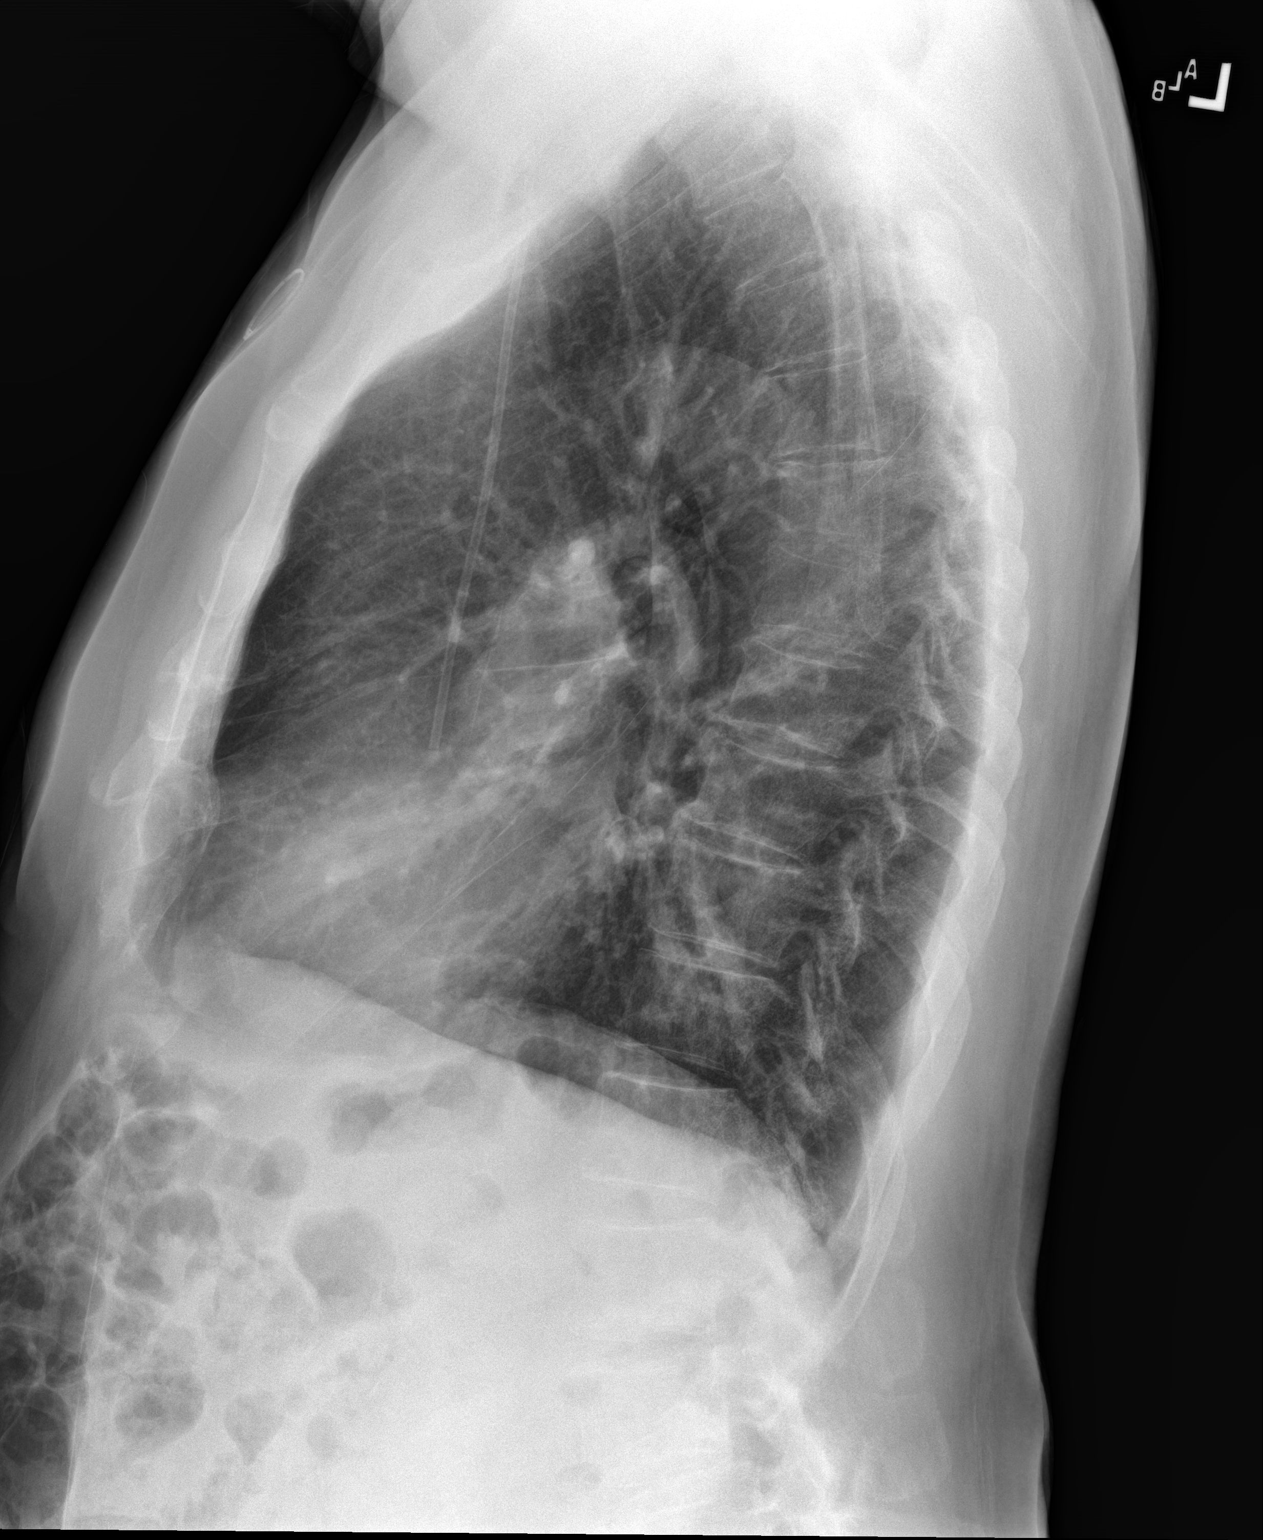

[3 of 3 positions shown; findings below may reference images not displayed]

FINDINGS: Right-sided Port-A-Cath with tip at the cavoatrial junction. There
is background of mild chronic bronchitic changes. No focal
consolidation, pleural effusion or pneumothorax. The cardiac
silhouette is within limits. Atherosclerotic calcification of the
aorta. No acute osseous pathology.
IMPRESSION: No active cardiopulmonary disease.
